# Patient Record
Sex: Male | Born: 1955
Health system: Southern US, Community
[De-identification: ages and names within clinical notes are randomized; demographics above are authoritative.]

## PROBLEM LIST (undated history)

## (undated) DIAGNOSIS — I5020 Unspecified systolic (congestive) heart failure: Secondary | ICD-10-CM

## (undated) DIAGNOSIS — E118 Type 2 diabetes mellitus with unspecified complications: Secondary | ICD-10-CM

## (undated) DIAGNOSIS — I509 Heart failure, unspecified: Secondary | ICD-10-CM

## (undated) DIAGNOSIS — I2510 Atherosclerotic heart disease of native coronary artery without angina pectoris: Secondary | ICD-10-CM

## (undated) DIAGNOSIS — N529 Male erectile dysfunction, unspecified: Secondary | ICD-10-CM

## (undated) DIAGNOSIS — G8929 Other chronic pain: Secondary | ICD-10-CM

## (undated) DIAGNOSIS — I10 Essential (primary) hypertension: Secondary | ICD-10-CM

## (undated) DIAGNOSIS — M25551 Pain in right hip: Secondary | ICD-10-CM

## (undated) HISTORY — PX: HAND RECONSTRUCTION: SHX1730

## (undated) HISTORY — DX: Other chronic pain: G89.29

## (undated) HISTORY — DX: Male erectile dysfunction, unspecified: N52.9

## (undated) HISTORY — DX: Pain in right hip: M25.551

---

## 2004-10-12 ENCOUNTER — Emergency Department: Payer: Self-pay | Admitting: Emergency Medicine

## 2006-03-17 ENCOUNTER — Ambulatory Visit: Payer: Self-pay | Admitting: Urology

## 2006-03-31 ENCOUNTER — Ambulatory Visit: Payer: Self-pay | Admitting: Urology

## 2006-04-02 ENCOUNTER — Ambulatory Visit: Payer: Self-pay | Admitting: Urology

## 2007-12-20 ENCOUNTER — Ambulatory Visit: Payer: Self-pay | Admitting: Urology

## 2007-12-30 ENCOUNTER — Ambulatory Visit: Payer: Self-pay | Admitting: Urology

## 2008-01-10 ENCOUNTER — Ambulatory Visit (HOSPITAL_COMMUNITY): Admission: RE | Admit: 2008-01-10 | Discharge: 2008-01-10 | Payer: Self-pay | Admitting: Orthopedic Surgery

## 2008-02-28 ENCOUNTER — Encounter: Admission: RE | Admit: 2008-02-28 | Discharge: 2008-02-28 | Payer: Self-pay | Admitting: Orthopedic Surgery

## 2008-03-28 ENCOUNTER — Encounter: Admission: RE | Admit: 2008-03-28 | Discharge: 2008-03-28 | Payer: Self-pay | Admitting: Orthopedic Surgery

## 2008-04-27 ENCOUNTER — Ambulatory Visit (HOSPITAL_COMMUNITY): Admission: RE | Admit: 2008-04-27 | Discharge: 2008-04-28 | Payer: Self-pay | Admitting: Orthopedic Surgery

## 2008-05-23 ENCOUNTER — Inpatient Hospital Stay (HOSPITAL_COMMUNITY): Admission: RE | Admit: 2008-05-23 | Discharge: 2008-05-26 | Payer: Self-pay | Admitting: Orthopedic Surgery

## 2009-01-02 ENCOUNTER — Ambulatory Visit (HOSPITAL_BASED_OUTPATIENT_CLINIC_OR_DEPARTMENT_OTHER): Admission: RE | Admit: 2009-01-02 | Discharge: 2009-01-02 | Payer: Self-pay | Admitting: Orthopedic Surgery

## 2010-03-05 LAB — HM COLONOSCOPY

## 2010-04-30 LAB — BASIC METABOLIC PANEL
BUN: 11 mg/dL (ref 6–23)
BUN: 9 mg/dL (ref 6–23)
CO2: 27 mEq/L (ref 19–32)
CO2: 30 mEq/L (ref 19–32)
Calcium: 8.7 mg/dL (ref 8.4–10.5)
Chloride: 101 mEq/L (ref 96–112)
Chloride: 102 mEq/L (ref 96–112)
Creatinine, Ser: 0.81 mg/dL (ref 0.4–1.5)
GFR calc Af Amer: >60 mL/min (ref >60)
GFR calc non Af Amer: >60 mL/min (ref >60)
Glucose, Bld: 112 mg/dL — ABNORMAL HIGH (ref 70–99)
Glucose, Bld: 184 mg/dL — ABNORMAL HIGH (ref 70–99)
Potassium: 3.6 mEq/L (ref 3.5–5.1)
Potassium: 3.9 mEq/L (ref 3.5–5.1)
Sodium: 133 mEq/L — ABNORMAL LOW (ref 135–145)
Sodium: 140 mEq/L (ref 135–145)
Sodium: 140 mEq/L (ref 135–145)

## 2010-04-30 LAB — CBC
Hemoglobin: 11 g/dL — ABNORMAL LOW (ref 13.0–17.0)
MCHC: 35.1 g/dL (ref 30.0–36.0)
MCV: 92.1 fL (ref 78.0–100.0)
MCV: 92.2 fL (ref 78.0–100.0)
RBC: 3.39 MIL/uL — ABNORMAL LOW (ref 4.22–5.81)
RBC: 3.53 MIL/uL — ABNORMAL LOW (ref 4.22–5.81)
RBC: 3.53 MIL/uL — ABNORMAL LOW (ref 4.22–5.81)
WBC: 13.1 10*3/uL — ABNORMAL HIGH (ref 4.0–10.5)
WBC: 9.2 10*3/uL (ref 4.0–10.5)

## 2010-04-30 LAB — GRAM STAIN: Gram Stain: NONE SEEN

## 2010-04-30 LAB — TISSUE CULTURE
Culture: NO GROWTH
Gram Stain: NONE SEEN

## 2010-04-30 LAB — ANAEROBIC CULTURE: Gram Stain: NONE SEEN

## 2010-04-30 LAB — PROTIME-INR
INR: 1.3 (ref 0.00–1.49)
INR: 2.7 — ABNORMAL HIGH (ref 0.00–1.49)
Prothrombin Time: 30.3 seconds — ABNORMAL HIGH (ref 11.6–15.2)

## 2010-05-01 LAB — CROSSMATCH
Antibody Screen: NEGATIVE
Antibody Screen: NEGATIVE

## 2010-05-01 LAB — CBC
HCT: 39.1 % (ref 39.0–52.0)
HCT: 42.8 % (ref 39.0–52.0)
Hemoglobin: 15.3 g/dL (ref 13.0–17.0)
MCHC: 34.7 g/dL (ref 30.0–36.0)
MCV: 90.8 fL (ref 78.0–100.0)
MCV: 93.1 fL (ref 78.0–100.0)
Platelets: 212 10*3/uL (ref 150–400)
Platelets: 132 10*3/uL — ABNORMAL LOW (ref 150–400)
RBC: 4.54 MIL/uL (ref 4.22–5.81)
RDW: 13.8 % (ref 11.5–15.5)
WBC: 8.8 10*3/uL (ref 4.0–10.5)
WBC: 8.2 10*3/uL (ref 4.0–10.5)

## 2010-05-01 LAB — DIFFERENTIAL
Basophils Absolute: 0 10*3/uL (ref 0.0–0.1)
Basophils Absolute: 0 10*3/uL (ref 0.0–0.1)
Basophils Relative: 0 % (ref 0–1)
Eosinophils Absolute: 0.1 10*3/uL (ref 0.0–0.7)
Eosinophils Absolute: 0.2 10*3/uL (ref 0.0–0.7)
Eosinophils Relative: 1 % (ref 0–5)
Lymphocytes Relative: 28 % (ref 12–46)
Lymphs Abs: 2.4 10*3/uL (ref 0.7–4.0)
Monocytes Absolute: 0.6 10*3/uL (ref 0.1–1.0)
Monocytes Relative: 10 % (ref 3–12)
Neutro Abs: 4.4 10*3/uL (ref 1.7–7.7)
Neutrophils Relative %: 54 % (ref 43–77)

## 2010-05-01 LAB — URINALYSIS, ROUTINE W REFLEX MICROSCOPIC
Bilirubin Urine: NEGATIVE
Hgb urine dipstick: NEGATIVE
Hgb urine dipstick: NEGATIVE
Ketones, ur: NEGATIVE mg/dL
Nitrite: NEGATIVE
Protein, ur: NEGATIVE mg/dL
Specific Gravity, Urine: 1.031 — ABNORMAL HIGH (ref 1.005–1.030)
Specific Gravity, Urine: 1.027 (ref 1.005–1.030)
Urobilinogen, UA: 0.2 mg/dL (ref 0.0–1.0)
Urobilinogen, UA: 0.2 mg/dL (ref 0.0–1.0)

## 2010-05-01 LAB — TISSUE CULTURE: Culture: NO GROWTH

## 2010-05-01 LAB — COMPREHENSIVE METABOLIC PANEL
ALT: 22 U/L (ref 0–53)
AST: 25 U/L (ref 0–37)
Albumin: 4.3 g/dL (ref 3.5–5.2)
Alkaline Phosphatase: 59 U/L (ref 39–117)
BUN: 14 mg/dL (ref 6–23)
CO2: 27 mEq/L (ref 19–32)
CO2: 29 mEq/L (ref 19–32)
Chloride: 101 mEq/L (ref 96–112)
Chloride: 104 mEq/L (ref 96–112)
Creatinine, Ser: 0.71 mg/dL (ref 0.4–1.5)
GFR calc Af Amer: >60 mL/min (ref >60)
GFR calc non Af Amer: >60 mL/min (ref >60)
GFR calc non Af Amer: >60 mL/min (ref >60)
Glucose, Bld: 82 mg/dL (ref 70–99)
Potassium: 4.1 mEq/L (ref 3.5–5.1)
Sodium: 139 mEq/L (ref 135–145)
Total Bilirubin: 0.4 mg/dL (ref 0.3–1.2)
Total Bilirubin: 0.6 mg/dL (ref 0.3–1.2)

## 2010-05-01 LAB — GRAM STAIN
Gram Stain: NONE SEEN
Gram Stain: NONE SEEN

## 2010-05-01 LAB — PROTIME-INR
Prothrombin Time: 13.3 seconds (ref 11.6–15.2)
Prothrombin Time: 13.4 seconds (ref 11.6–15.2)

## 2010-05-01 LAB — ABO/RH: ABO/RH(D): A POS

## 2010-05-01 LAB — C-REACTIVE PROTEIN: CRP: 0.2 mg/dL — ABNORMAL LOW (ref <0.6)

## 2010-05-01 LAB — SEDIMENTATION RATE: Sed Rate: 14 mm/hr (ref 0–16)

## 2010-05-01 LAB — ANAEROBIC CULTURE
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
## 2010-07-23 ENCOUNTER — Ambulatory Visit: Payer: Self-pay | Admitting: Pain Medicine

## 2011-04-30 ENCOUNTER — Ambulatory Visit: Payer: Self-pay | Admitting: Family Medicine

## 2011-05-07 ENCOUNTER — Ambulatory Visit: Payer: Self-pay | Admitting: General Surgery

## 2011-05-07 LAB — CBC
RBC: 4.86 10*6/uL (ref 4.40–5.90)
RDW: 12.9 % (ref 11.5–14.5)
WBC: 7.4 10*3/uL (ref 3.8–10.6)

## 2011-05-12 ENCOUNTER — Ambulatory Visit: Payer: Self-pay | Admitting: General Surgery

## 2011-05-12 HISTORY — PX: CHOLECYSTECTOMY: SHX55

## 2011-05-14 LAB — PATHOLOGY REPORT

## 2011-10-31 HISTORY — PX: HEMORRHOID SURGERY: SHX153

## 2014-05-14 NOTE — Op Note (Signed)
PATIENT NAME:  Curtis [name], Curtis [name] MR#:  [id number] DATE OF BIRTH:  06-01-1955  DATE OF PROCEDURE:  05/12/2011  PREOPERATIVE DIAGNOSIS: Chronic cholecystitis and cholelithiasis.   POSTOPERATIVE DIAGNOSIS: Chronic cholecystitis and cholelithiasis.  OPERATIVE PROCEDURE: Laparoscopic cholecystectomy with intraoperative cholangiograms.   SURGEON: Donnalee Curry, MD   ANESTHESIA: General endotracheal.   ESTIMATED BLOOD LOSS: Less than 5 mL.   CLINICAL NOTE: This 59 year old male has had episodic abdominal pain, and ultrasound showed evidence of cholelithiasis. He was admitted for elective cholecystectomy.   OPERATIVE NOTE: With the patient under adequate general endotracheal anesthesia, the abdomen was prepped with ChloraPrep and draped. In Trendelenburg position, a Veress needle was placed through a transumbilical incision. After assuring intra-abdominal location with the hanging drop test, the abdomen was insufflated with CO2 at 10 mmHg pressure. The patient was then placed in reverse Trendelenburg position and rolled to the left. An 11 mm Xcel port was placed in the epigastrium under direct vision and two 5-mm step ports placed on the right lateral abdominal wall. Adhesions of the omentum to the liver and undersurface of the gallbladder were taken down with cautery dissection. The gallbladder wall was a pale, pasty cream-color. The gallbladder was placed on cephalad traction. It was not particularly distended. The neck of the gallbladder was cleared and the cystic duct identified. A Kumar clamp was placed and fluoroscopic cholangiograms completed using 25 mL of one-half strength Conray 60. This showed a small cystic duct as well as a fairly small common hepatic and common bile duct. There was reflux into both the right and left hepatic ducts and free flow into the duodenum. No evidence of retained stones. The cystic duct and branches of the cystic artery were doubly clipped and divided. The gallbladder  was removed from the liver bed making use of hook cautery dissection. It was placed into a retrieval bag due to the appearance of the gallbladder wall to minimize the chance of rupture. This was delivered easily through the umbilical site. After re-establishing pneumoperitoneum, the right upper quadrant was irrigated with lactated Ringer's solution. Good hemostasis was appreciated. The fascia at the umbilical site was closed with an 0 Vicryl suture. Skin incisions were closed with 4-0 Vicryl subcuticular sutures. Benzoin, Steri-Strips, Telfa, and Tegaderm dressings were then applied.   The patient tolerated the procedure well and was taken to the recovery room in stable condition.  ____________________________ Earline Mayotte, MD jwb:cbb D: 05/12/2011 20:54:24 ET T: 05/13/2011 09:36:15 ET JOB#: [id number]  cc: Earline Mayotte, MD, <Dictator> [place]. [place], [place] Ioan Landini Brion Aliment MD ELECTRONICALLY SIGNED 05/13/2011 14:59

## 2014-09-18 ENCOUNTER — Other Ambulatory Visit: Payer: Self-pay | Admitting: Family Medicine

## 2014-09-18 DIAGNOSIS — G8929 Other chronic pain: Secondary | ICD-10-CM

## 2014-09-18 DIAGNOSIS — M25551 Pain in right hip: Principal | ICD-10-CM

## 2014-09-18 MED ORDER — HYDROCODONE-ACETAMINOPHEN 5-325 MG PO TABS: ORAL_TABLET | ORAL | Status: DC

## 2014-09-18 NOTE — Telephone Encounter (Signed)
Refill Request.  

## 2014-09-18 NOTE — Telephone Encounter (Signed)
Prescriptions x 3 up front for pickup

## 2014-09-18 NOTE — Telephone Encounter (Signed)
Pt contacted office for refill request on the following medications:  Hydrocodone 5-325mg . 90 day supply. CB#[id number]

## 2014-09-19 NOTE — Telephone Encounter (Signed)
Patient has been advised Rx is available for pick up. KW 

## 2015-01-08 ENCOUNTER — Other Ambulatory Visit: Payer: Self-pay | Admitting: Family Medicine

## 2015-01-08 ENCOUNTER — Telehealth: Payer: Self-pay | Admitting: Family Medicine

## 2015-01-08 DIAGNOSIS — M25551 Pain in right hip: Principal | ICD-10-CM

## 2015-01-08 DIAGNOSIS — G8929 Other chronic pain: Secondary | ICD-10-CM

## 2015-01-08 MED ORDER — HYDROCODONE-ACETAMINOPHEN 5-325 MG PO TABS: ORAL_TABLET | ORAL | Status: DC

## 2015-01-08 NOTE — Telephone Encounter (Signed)
Refill request

## 2015-01-08 NOTE — Telephone Encounter (Signed)
Prescriptions available for pickup. Will need office visit prior to next refills.

## 2015-01-08 NOTE — Telephone Encounter (Signed)
Left detailed message instructing patient as below.KW

## 2015-01-08 NOTE — Telephone Encounter (Signed)
Pt called needing HYDROcodone-acetaminophen (NORCO/VICODIN) 5-325   He said that you write 3 separate Rxs for three months.  His call back is  [phone]  [facility]

## 2015-01-12 ENCOUNTER — Telehealth: Payer: Self-pay

## 2015-01-23 ENCOUNTER — Encounter: Payer: Self-pay | Admitting: Family Medicine

## 2015-01-23 ENCOUNTER — Ambulatory Visit (INDEPENDENT_AMBULATORY_CARE_PROVIDER_SITE_OTHER): Payer: Self-pay | Admitting: Family Medicine

## 2015-01-23 VITALS — BP 124/70 | HR 80 | Temp 98.6°F | Resp 16 | Wt 153.4 lb

## 2015-01-23 DIAGNOSIS — M79601 Pain in right arm: Secondary | ICD-10-CM

## 2015-01-23 DIAGNOSIS — G8929 Other chronic pain: Secondary | ICD-10-CM

## 2015-01-23 DIAGNOSIS — M79604 Pain in right leg: Secondary | ICD-10-CM

## 2015-01-23 DIAGNOSIS — Z87442 Personal history of urinary calculi: Secondary | ICD-10-CM

## 2015-01-23 DIAGNOSIS — N529 Male erectile dysfunction, unspecified: Secondary | ICD-10-CM | POA: Insufficient documentation

## 2015-01-23 DIAGNOSIS — M79606 Pain in leg, unspecified: Secondary | ICD-10-CM

## 2015-01-23 NOTE — Patient Instructions (Signed)
Consider labs at [facility] Department to check your cholesterol, sugar, and kidney function. F/u with [facility] regarding screening colonoscopy. F/u with urology as scheduled.
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
## 2015-05-02 ENCOUNTER — Other Ambulatory Visit: Payer: Self-pay | Admitting: Family Medicine

## 2015-05-02 DIAGNOSIS — M25551 Pain in right hip: Principal | ICD-10-CM

## 2015-05-02 DIAGNOSIS — G8929 Other chronic pain: Secondary | ICD-10-CM

## 2015-05-02 MED ORDER — HYDROCODONE-ACETAMINOPHEN 5-325 MG PO TABS: ORAL_TABLET | ORAL | Status: DC

## 2015-05-02 NOTE — Telephone Encounter (Signed)
Patient advised RX is at front desk for pick up.  

## 2015-05-02 NOTE — Telephone Encounter (Signed)
Pt contacted office for refill request on the following medications: HYDROcodone-acetaminophen (NORCO/VICODIN) 5-325 MG tablet. Pt would like to pick up 3 RX for a 90 day supply. Please advise. Thanks TNP

## 2015-05-02 NOTE — Telephone Encounter (Signed)
Prescriptions completed for 3 months and up front for pickup

## 2015-05-22 ENCOUNTER — Encounter: Payer: Self-pay | Admitting: Family Medicine

## 2015-05-22 ENCOUNTER — Telehealth: Payer: Self-pay | Admitting: Family Medicine

## 2015-05-22 ENCOUNTER — Ambulatory Visit (INDEPENDENT_AMBULATORY_CARE_PROVIDER_SITE_OTHER): Payer: Self-pay | Admitting: Family Medicine

## 2015-05-22 VITALS — BP 130/84 | HR 104 | Temp 98.6°F | Resp 16

## 2015-05-22 DIAGNOSIS — H6983 Other specified disorders of Eustachian tube, bilateral: Secondary | ICD-10-CM

## 2015-05-22 DIAGNOSIS — J069 Acute upper respiratory infection, unspecified: Secondary | ICD-10-CM

## 2015-05-22 NOTE — Telephone Encounter (Signed)
Pt wife is asking if a Rx was called in for todays visit?  [facility] [place].  [phone]

## 2015-05-22 NOTE — Patient Instructions (Signed)
Discussed use of Mucinex D for congestion, Delsym for cough, and Benadryl for post nasal drainage at night. Start steroid nasal spray to help with your ear fullness. Stop Q-tips.

## 2015-05-22 NOTE — Telephone Encounter (Signed)
Advised patient's wife about today's office visit.

## 2015-05-22 NOTE — Progress Notes (Signed)
Subjective:     Patient ID: Curtis [name], male   DOB: 04-Jul-1955, 60 y.o.   MRN: [id number]  HPI  Chief Complaint  Patient presents with  . Ear Pain    bilateral pain 2-3 days temp 100 last night  States he first developed sinus congestion then ear discomfort-"like a balloon"-and feeling feverish. Denies sore throat and cough. Reports using otc products including Advil cold and sinus without improvement. States he does use Q-tips regularly. His wife, Lura Em, accompanies him today and states she has been laid off from her job as a Occupational hygienist.   Review of Systems     Objective:   Physical Exam  Constitutional: He appears well-developed and well-nourished. No distress.  Ears: T.M's intact, dull, without inflammation; +tragal tenderness, ear canals sensitive but not inflamed without drainage or debris. Throat: no tonsillar enlargement or exudate Neck: no cervical adenopathy Lungs: clear     Assessment:    1. Upper respiratory infection  2. Eustachian tube dysfunction, bilateral     Plan:    Discussed use of steroid nasal spray and other otc products for his symptoms. Stop Q-tip use. May call if sinuses/ears not improving over the next few days.

## 2015-08-13 ENCOUNTER — Other Ambulatory Visit: Payer: Self-pay | Admitting: Family Medicine

## 2015-08-13 ENCOUNTER — Telehealth: Payer: Self-pay

## 2015-08-13 DIAGNOSIS — M25551 Pain in right hip: Principal | ICD-10-CM

## 2015-08-13 DIAGNOSIS — G8929 Other chronic pain: Secondary | ICD-10-CM

## 2015-08-13 MED ORDER — HYDROCODONE-ACETAMINOPHEN 5-325 MG PO TABS: ORAL_TABLET | ORAL | 0 refills | Status: DC

## 2015-08-13 NOTE — Telephone Encounter (Signed)
Pt advised-aa 

## 2015-08-13 NOTE — Telephone Encounter (Signed)
[employer] called for refill request for Norco, please review-aa

## 2015-08-13 NOTE — Telephone Encounter (Signed)
Please call and tell him his medications are ready for pickup

## 2015-08-13 NOTE — Telephone Encounter (Signed)
I have placed prescription for 3 months up front for pickup.

## 2015-11-26 ENCOUNTER — Telehealth: Payer: Self-pay | Admitting: Family Medicine

## 2015-11-26 ENCOUNTER — Other Ambulatory Visit: Payer: Self-pay | Admitting: Family Medicine

## 2015-11-26 DIAGNOSIS — M25551 Pain in right hip: Principal | ICD-10-CM

## 2015-11-26 DIAGNOSIS — G8929 Other chronic pain: Secondary | ICD-10-CM

## 2015-11-26 MED ORDER — HYDROCODONE-ACETAMINOPHEN 5-325 MG PO TABS: ORAL_TABLET | ORAL | 0 refills | Status: DC

## 2015-11-26 NOTE — Telephone Encounter (Signed)
Patient has been advised. KW 

## 2015-11-26 NOTE — Telephone Encounter (Signed)
Pt contacted office for refill request on the following medications:  HYDROcodone-acetaminophen (NORCO/VICODIN) 5-325 MG tablet.  CB#[phone]/MW

## 2015-11-26 NOTE — Telephone Encounter (Signed)
3 month prescriptions up front for pickup.

## 2015-11-26 NOTE — Telephone Encounter (Signed)
Please review. KW 

## 2016-03-10 ENCOUNTER — Encounter: Payer: Self-pay | Admitting: Family Medicine

## 2016-03-10 ENCOUNTER — Ambulatory Visit (INDEPENDENT_AMBULATORY_CARE_PROVIDER_SITE_OTHER): Payer: Self-pay | Admitting: Family Medicine

## 2016-03-10 VITALS — BP 108/62 | HR 103 | Temp 98.7°F

## 2016-03-10 DIAGNOSIS — J0100 Acute maxillary sinusitis, unspecified: Secondary | ICD-10-CM

## 2016-03-10 DIAGNOSIS — G8929 Other chronic pain: Secondary | ICD-10-CM

## 2016-03-10 DIAGNOSIS — R350 Frequency of micturition: Secondary | ICD-10-CM

## 2016-03-10 DIAGNOSIS — M79604 Pain in right leg: Secondary | ICD-10-CM

## 2016-03-10 LAB — POCT URINALYSIS DIPSTICK
Bilirubin, UA: NEGATIVE
Glucose, UA: 2000
Ketones, UA: NEGATIVE
LEUKOCYTES UA: NEGATIVE
Nitrite, UA: NEGATIVE
PROTEIN UA: NEGATIVE
Spec Grav, UA: <=1.005
Urobilinogen, UA: 0.2
pH, UA: 5

## 2016-03-10 MED ORDER — HYDROCODONE-ACETAMINOPHEN 5-325 MG PO TABS: ORAL_TABLET | ORAL | 0 refills | Status: DC

## 2016-03-10 MED ORDER — AMOXICILLIN-POT CLAVULANATE 875-125 MG PO TABS: 1.0000 | ORAL_TABLET | Freq: Two times a day (BID) | ORAL | 0 refills | Status: DC

## 2016-03-10 NOTE — Patient Instructions (Signed)
We will call you with the culture results 

## 2016-03-10 NOTE — Progress Notes (Signed)
Subjective:     Patient ID: Curtis [name], male   DOB: [birth year], 61 y.o.   MRN: [id number]  HPI  Chief Complaint  Patient presents with  . Pain    Patient comes in office today to follopw up for chronic pain of lower extremity, patient is requesting a refill on his Hydrocodone-Acetaminophen 5-325mg .   Also reports cold sx onset one week ago with increased sinus pressure and purulent drainage. Also reports urinary frequency and fears he is developing a UTI. Patient defers screening labs/flu shot today.   Review of Systems     Objective:   Physical Exam  Constitutional: He appears well-developed and well-nourished. No distress.  Uses a motorized wheelchair.  Ears: T.M's intact without inflammation Sinuses: mild maxillary sinus tenderness Throat: no tonsillar enlargement or exudate Neck: no cervical adenopathy Lungs: clear     Assessment:    1. Urinary frequency - POCT urinalysis dipstick - Urine culture  2. Chronic pain of right lower extremity: refilled x 3 months - HYDROcodone-acetaminophen (NORCO/VICODIN) 5-325 MG tablet; Twice daily for pain  Dispense: 60 tablet; Refill: 0  3. Acute non-recurrent maxillary sinusitis - amoxicillin-clavulanate (AUGMENTIN) 875-125 MG tablet; Take 1 tablet by mouth 2 (two) times daily.  Dispense: 20 tablet; Refill: 0    Plan:    Further f/u pending urine culture.

## 2016-03-12 LAB — URINE CULTURE

## 2016-03-13 ENCOUNTER — Other Ambulatory Visit: Payer: Self-pay | Admitting: Family Medicine

## 2016-03-13 ENCOUNTER — Telehealth: Payer: Self-pay

## 2016-03-13 MED ORDER — DOXYCYCLINE HYCLATE 100 MG PO TABS
100.0000 mg | ORAL_TABLET | Freq: Two times a day (BID) | ORAL | 0 refills | Status: DC
Start: 2016-03-13 — End: 2017-05-25

## 2016-03-13 NOTE — Telephone Encounter (Signed)
Patient has been advised. KW 

## 2016-03-13 NOTE — Telephone Encounter (Signed)
-----   Message from Anola Gurney, [place] sent at 03/13/2016  7:29 AM EST ----- You do have a urinary tract infection but it is resistant to the medication I prescribed. I am going to switch you to doxycycline and will send it in.

## 2016-06-17 ENCOUNTER — Telehealth: Payer: Self-pay | Admitting: Family Medicine

## 2016-06-17 NOTE — Telephone Encounter (Signed)
Pt contacted office for refill request on the following medications: HYDROcodone-acetaminophen (NORCO/VICODIN) 5-325 MG tablet  Last Rx: 03/10/16 Pt picked up 3 Rx's  Pt is requesting to pick up 3 Rx's to last him for 3 months. Please advise. Thanks TNP

## 2016-06-18 ENCOUNTER — Other Ambulatory Visit: Payer: Self-pay | Admitting: Family Medicine

## 2016-06-18 DIAGNOSIS — G8929 Other chronic pain: Secondary | ICD-10-CM

## 2016-06-18 DIAGNOSIS — M79604 Pain in right leg: Principal | ICD-10-CM

## 2016-06-18 MED ORDER — HYDROCODONE-ACETAMINOPHEN 5-325 MG PO TABS: ORAL_TABLET | ORAL | 0 refills | Status: DC

## 2016-06-18 NOTE — Telephone Encounter (Signed)
Hydrocodone prescriptions up front for pickup.

## 2016-06-18 NOTE — Telephone Encounter (Signed)
Please review. Thanks!  

## 2016-06-18 NOTE — Telephone Encounter (Signed)
Tried calling patient --no answer

## 2016-09-29 ENCOUNTER — Other Ambulatory Visit: Payer: Self-pay | Admitting: Family Medicine

## 2016-09-29 DIAGNOSIS — M79604 Pain in right leg: Principal | ICD-10-CM

## 2016-09-29 DIAGNOSIS — G8929 Other chronic pain: Secondary | ICD-10-CM

## 2016-09-29 MED ORDER — HYDROCODONE-ACETAMINOPHEN 5-325 MG PO TABS: ORAL_TABLET | ORAL | 0 refills | Status: DC

## 2016-09-29 NOTE — Telephone Encounter (Signed)
Prescriptions up front for pickup

## 2016-09-29 NOTE — Telephone Encounter (Signed)
Pt needs refill on his hydrocodone 5-325 mg   He wants 3 prescriptions.  Thanks [employer]

## 2017-02-09 ENCOUNTER — Other Ambulatory Visit: Payer: Self-pay | Admitting: Family Medicine

## 2017-02-09 DIAGNOSIS — M79604 Pain in right leg: Principal | ICD-10-CM

## 2017-02-09 DIAGNOSIS — G8929 Other chronic pain: Secondary | ICD-10-CM

## 2017-02-09 MED ORDER — HYDROCODONE-ACETAMINOPHEN 5-325 MG PO TABS: ORAL_TABLET | ORAL | 0 refills | Status: DC

## 2017-02-09 NOTE — Telephone Encounter (Signed)
Pt. Needs refill on Hydrocodone 5/325 mg.

## 2017-02-09 NOTE — Telephone Encounter (Signed)
3 month's prescriptions up front for pickup

## 2017-02-09 NOTE — Telephone Encounter (Signed)
Patient advised.KW 

## 2017-04-03 ENCOUNTER — Telehealth: Payer: Self-pay | Admitting: Family Medicine

## 2017-04-03 ENCOUNTER — Ambulatory Visit: Payer: Self-pay | Admitting: Family Medicine

## 2017-04-03 NOTE — Telephone Encounter (Signed)
Pt states he needs an RX for a new wheelchair by today at noon.  I scheduled him an appt for today @ 940 and told him I would send a message as well.  Pt requesting a call back if ne doesn't need an appt for this request.

## 2017-04-03 NOTE — Telephone Encounter (Signed)
Nadine Counts had spoke with patient on phone and was able to resolve the matter. Patients appt has been canceled. KW

## 2017-04-08 ENCOUNTER — Telehealth: Payer: Self-pay | Admitting: Family Medicine

## 2017-04-08 NOTE — Telephone Encounter (Signed)
[employer] received a copy of a RX for a wheel chair for Curtis [name].  However, the copy is not legible and they need a new one faxed back to them.

## 2017-04-08 NOTE — Telephone Encounter (Signed)
Fax # to [employer] is [phone]

## 2017-04-09 NOTE — Telephone Encounter (Signed)
done

## 2017-05-25 ENCOUNTER — Ambulatory Visit (INDEPENDENT_AMBULATORY_CARE_PROVIDER_SITE_OTHER): Payer: Self-pay | Admitting: Family Medicine

## 2017-05-25 ENCOUNTER — Other Ambulatory Visit: Payer: Self-pay | Admitting: Family Medicine

## 2017-05-25 ENCOUNTER — Encounter: Payer: Self-pay | Admitting: Family Medicine

## 2017-05-25 VITALS — BP 120/70 | HR 104 | Temp 98.9°F | Resp 16

## 2017-05-25 DIAGNOSIS — G8929 Other chronic pain: Secondary | ICD-10-CM

## 2017-05-25 DIAGNOSIS — M79604 Pain in right leg: Secondary | ICD-10-CM

## 2017-05-25 MED ORDER — HYDROCODONE-ACETAMINOPHEN 5-325 MG PO TABS: ORAL_TABLET | ORAL | 0 refills | Status: DC

## 2017-05-25 NOTE — Patient Instructions (Signed)
Call for the next refill. Let me know if you are developing any further respiratory sx.

## 2017-05-25 NOTE — Progress Notes (Signed)
  Subjective:     Patient ID: Curtis [name], male   DOB: 1955/11/30, 62 y.o.   MRN: [id number] Chief Complaint  Patient presents with  . Follow-up    Patient comes in office today for follow up of chornic pain on the right lower extremity, patient is currently taking Hydrocodone-Acetaminophen 5-325mg .    HPI States he is recovering from a URI. Continues to smoke 0.5 ppd. He is pending getting a new electric w/c and is working on disability. Defers labs.  Review of Systems  Respiratory: Negative for shortness of breath.   Cardiovascular: Negative for chest pain and palpitations.  Musculoskeletal:       Reports nocturnal muscle spasms of his right buttock area. He resolves this by getting up and letting his affected side hang until the spasms exhaust themselves.       Objective:   Physical Exam  Constitutional: He appears well-developed and well-nourished. No distress.  Pulmonary/Chest: Breath sounds normal. He has no wheezes.  Musculoskeletal: He exhibits no edema (of lower extremities).       Assessment:    1. Chronic pain of right lower extremity - HYDROcodone-acetaminophen (NORCO/VICODIN) 5-325 MG tablet; Twice daily for pain  Dispense: 60 tablet; Refill: 0    Plan:    May call for monthly refills of pain medication.

## 2017-07-10 ENCOUNTER — Other Ambulatory Visit: Payer: Self-pay

## 2017-07-10 DIAGNOSIS — G8929 Other chronic pain: Secondary | ICD-10-CM

## 2017-07-10 DIAGNOSIS — M79604 Pain in right leg: Principal | ICD-10-CM

## 2017-07-10 MED ORDER — HYDROCODONE-ACETAMINOPHEN 5-325 MG PO TABS: ORAL_TABLET | ORAL | 0 refills | Status: DC

## 2017-08-24 ENCOUNTER — Telehealth: Payer: Self-pay

## 2017-08-24 ENCOUNTER — Other Ambulatory Visit: Payer: Self-pay | Admitting: Family Medicine

## 2017-08-24 DIAGNOSIS — M79604 Pain in right leg: Principal | ICD-10-CM

## 2017-08-24 DIAGNOSIS — G8929 Other chronic pain: Secondary | ICD-10-CM

## 2017-08-24 MED ORDER — HYDROCODONE-ACETAMINOPHEN 5-325 MG PO TABS: ORAL_TABLET | ORAL | 0 refills | Status: DC

## 2017-08-24 NOTE — Telephone Encounter (Signed)
Patient called requesting refill on hydrocodone. [facility] [place].

## 2017-08-24 NOTE — Telephone Encounter (Signed)
done

## 2017-10-07 ENCOUNTER — Other Ambulatory Visit: Payer: Self-pay | Admitting: Family Medicine

## 2017-10-07 DIAGNOSIS — M79604 Pain in right leg: Principal | ICD-10-CM

## 2017-10-07 DIAGNOSIS — G8929 Other chronic pain: Secondary | ICD-10-CM

## 2017-10-07 MED ORDER — HYDROCODONE-ACETAMINOPHEN 5-325 MG PO TABS: ORAL_TABLET | ORAL | 0 refills | Status: DC

## 2017-10-07 NOTE — Telephone Encounter (Signed)
Pt contacted office for refill request on the following medications:  HYDROcodone-acetaminophen (NORCO/VICODIN) 5-325 MG tablet  [facility] [place]  Last Rx: 08/24/17 LOV: 05/25/17 Please advise. Thanks TNP

## 2017-11-16 ENCOUNTER — Telehealth: Payer: Self-pay | Admitting: Family Medicine

## 2017-11-16 NOTE — Telephone Encounter (Signed)
Pt needs refill on   Hydrocodone 5-325    [facility] [place]  CB#  [phone]  Thanks   teri

## 2017-11-17 ENCOUNTER — Other Ambulatory Visit: Payer: Self-pay | Admitting: Family Medicine

## 2017-11-17 DIAGNOSIS — G8929 Other chronic pain: Secondary | ICD-10-CM

## 2017-11-17 DIAGNOSIS — M79604 Pain in right leg: Principal | ICD-10-CM

## 2017-11-17 MED ORDER — HYDROCODONE-ACETAMINOPHEN 5-325 MG PO TABS: ORAL_TABLET | ORAL | 0 refills | Status: DC

## 2018-01-21 ENCOUNTER — Telehealth: Payer: Self-pay | Admitting: Family Medicine

## 2018-01-21 ENCOUNTER — Other Ambulatory Visit: Payer: Self-pay | Admitting: Family Medicine

## 2018-01-21 DIAGNOSIS — M79604 Pain in right leg: Principal | ICD-10-CM

## 2018-01-21 DIAGNOSIS — G8929 Other chronic pain: Secondary | ICD-10-CM

## 2018-01-21 MED ORDER — HYDROCODONE-ACETAMINOPHEN 5-325 MG PO TABS: ORAL_TABLET | ORAL | 0 refills | Status: DC

## 2018-01-21 NOTE — Telephone Encounter (Signed)
Pt requesting refill of HYDROcodone-acetaminophen 5-325 MG sent to [facility] in [place]

## 2018-01-21 NOTE — Telephone Encounter (Signed)
Please review

## 2018-01-21 NOTE — Telephone Encounter (Signed)
done

## 2018-03-02 ENCOUNTER — Telehealth: Payer: Self-pay | Admitting: Family Medicine

## 2018-03-02 ENCOUNTER — Other Ambulatory Visit: Payer: Self-pay | Admitting: Family Medicine

## 2018-03-02 DIAGNOSIS — M79604 Pain in right leg: Principal | ICD-10-CM

## 2018-03-02 DIAGNOSIS — G8929 Other chronic pain: Secondary | ICD-10-CM

## 2018-03-02 MED ORDER — HYDROCODONE-ACETAMINOPHEN 5-325 MG PO TABS: ORAL_TABLET | ORAL | 0 refills | Status: DC

## 2018-03-02 NOTE — Telephone Encounter (Signed)
Pt needing refill on:  HYDROcodone-acetaminophen (NORCO/VICODIN) 5-325 MG tablet  Please refill at:  [id number] - [place], [place] - [address] AT [facility] OF [address] [phone] (Phone) [phone] (Fax)   Thanks, [employer]

## 2018-03-02 NOTE — Telephone Encounter (Signed)
lov 05/25/17, last filled 01/21/18, please review. KW

## 2018-03-04 ENCOUNTER — Ambulatory Visit (INDEPENDENT_AMBULATORY_CARE_PROVIDER_SITE_OTHER): Payer: Self-pay | Admitting: Family Medicine

## 2018-03-04 DIAGNOSIS — M79604 Pain in right leg: Secondary | ICD-10-CM

## 2018-03-04 DIAGNOSIS — G8929 Other chronic pain: Secondary | ICD-10-CM

## 2018-03-04 NOTE — Progress Notes (Signed)
Patient ID: Curtis [name], male   DOB: 10/26/1955, 63 y.o.   MRN: [id number] He is here today to discuss who he should follow up with after my retirement. He has previously seen Dr. Sherrie Mustache per his report. He takes hydrocodone once or twice daily for chronic hip pain after reconstructive surgery after severe workman's comp. Injury. I have just refilled his medication 2/11. I will have him establish with Dr. Sherrie Mustache before he requires refills.

## 2018-03-04 NOTE — Patient Instructions (Signed)
Do get established with Osvaldo Angst P.A. or Shirlee Latch M.D. before you need your next refill of pain medication.

## 2018-03-19 ENCOUNTER — Ambulatory Visit (INDEPENDENT_AMBULATORY_CARE_PROVIDER_SITE_OTHER): Payer: Self-pay | Admitting: Family Medicine

## 2018-03-19 ENCOUNTER — Encounter: Payer: Self-pay | Admitting: Family Medicine

## 2018-03-19 ENCOUNTER — Other Ambulatory Visit: Payer: Self-pay

## 2018-03-19 VITALS — BP 120/68 | HR 94 | Temp 98.2°F | Ht 68.0 in | Wt 142.0 lb

## 2018-03-19 DIAGNOSIS — E1165 Type 2 diabetes mellitus with hyperglycemia: Secondary | ICD-10-CM

## 2018-03-19 LAB — HEMOGLOBIN A1C: Hemoglobin A1C: >14

## 2018-03-19 MED ORDER — GLIPIZIDE 5 MG PO TABS: ORAL_TABLET | ORAL | 1 refills | Status: DC

## 2018-03-19 NOTE — Patient Instructions (Signed)
.   Please review the attached list of medications and notify my office if there are any errors.   . Please bring all of your medications to every appointment so we can make sure that our medication list is the same as yours.   

## 2018-03-19 NOTE — Progress Notes (Signed)
       Patient: Curtis [name] Male    DOB: 04-14-1955   63 y.o.   MRN: [id number] Visit Date: 03/19/2018  Today's Provider: Mila Merry, MD   Chief Complaint  Patient presents with  . Pain    right lower extremity.  this is a fup to establish with [facility]   Subjective:     HPI   Follow up for chronic pain of right lower extremity  The patient was last seen for this 2 weeks ago. Changes made at last visit include none  He reports excellent compliance with treatment. He feels that condition is Worse. He is not having side effects.  He reports he been taking current pain medication regiment for several years and it remains effective.   ------------------------------------------------------------------------------------  He also states he has been checking his blood sugar with his sisters meter and it has been running consistently in 200s to 300s. Has been a little more fatigued. Lately. Is urinating more frequently.     Wt Readings from Last 5 Encounters:  03/19/18 142 lb (64.4 kg)  01/23/15 153 lb 6.4 oz (69.6 kg)  06/02/14 155 lb (70.3 kg)     Allergies  Allergen Reactions  . Codeine   . Morphine And Related      Current Outpatient Medications:  .  HYDROcodone-acetaminophen (NORCO/VICODIN) 5-325 MG tablet, Twice daily for pain, Disp: 60 tablet, Rfl: 0 .  LEVITRA 20 MG tablet, TK 1/2- 1 T PO PRN, Disp: , Rfl: 11  Review of Systems  Social History   Tobacco Use  . Smoking status: Current Every Day Smoker    Packs/day: 0.50    Types: Cigarettes  . Smokeless tobacco: Never Used  Substance Use Topics  . Alcohol use: No    Alcohol/week: 0.0 standard drinks      Objective:   BP 120/68 (BP Location: Left Arm, Patient Position: Sitting, Cuff Size: Normal)   Pulse 94   Temp 98.2 F (36.8 C) (Oral)   Ht 5\' 8"  (1.727 m)   Wt 142 lb (64.4 kg)   SpO2 98%   BMI 21.59 kg/m  Vitals:   03/19/18 1122  BP: 120/68  Pulse: 94  Temp: 98.2 F (36.8 C)    TempSrc: Oral  SpO2: 98%  Weight: 142 lb (64.4 kg)  Height: 5\' 8"  (1.727 m)     Physical Exam  General appearance: alert, well developed, well nourished, cooperative and in no distress Head: Normocephalic, without obvious abnormality, atraumatic Respiratory: Respirations even and unlabored, normal respiratory rate Extremities: No gross deformities Skin: Skin color, texture, turgor normal. No rashes seen  Psych: Appropriate mood and affect. Neurologic: Mental status: Alert, oriented to person, place, and time, thought content appropriate.  HgbA1c>14.0    Assessment & Plan    1. Uncontrolled type 2 diabetes mellitus with hyperglycemia (HCC) Newly diagnosed. He currently has no insurance but is in process of appyling for disability. Will start- glipiZIDE (GLUCOTROL) 5 MG tablet; 1/2 tablet every morning for six days then increase to 1 tablet every morning  Dispense: 30 tablet; Refill: 1  But advised he will need labs soon and expect to start metformin if his kidney functions are normal.   He is to return in a month to see how sugars are on glipizide and anticipate checking renal panel.      Mila Merry, MD  [facility]

## 2018-04-05 ENCOUNTER — Other Ambulatory Visit: Payer: Self-pay

## 2018-04-05 DIAGNOSIS — G8929 Other chronic pain: Secondary | ICD-10-CM

## 2018-04-05 DIAGNOSIS — M79604 Pain in right leg: Principal | ICD-10-CM

## 2018-04-05 MED ORDER — HYDROCODONE-ACETAMINOPHEN 5-325 MG PO TABS: ORAL_TABLET | ORAL | 0 refills | Status: DC

## 2018-04-26 ENCOUNTER — Ambulatory Visit (INDEPENDENT_AMBULATORY_CARE_PROVIDER_SITE_OTHER): Payer: Self-pay | Admitting: Family Medicine

## 2018-04-26 ENCOUNTER — Other Ambulatory Visit: Payer: Self-pay

## 2018-04-26 VITALS — BP 131/76 | HR 95 | Temp 98.9°F | Resp 16

## 2018-04-26 DIAGNOSIS — E1165 Type 2 diabetes mellitus with hyperglycemia: Secondary | ICD-10-CM

## 2018-04-26 LAB — POCT GLYCOSYLATED HEMOGLOBIN (HGB A1C)
Est. average glucose Bld gHb Est-mCnc: 237
Hemoglobin A1C: 9.9 % — AB (ref 4.0–5.6)

## 2018-04-26 MED ORDER — MONTELUKAST SODIUM 10 MG PO TABS: 10.0000 mg | ORAL_TABLET | Freq: Every day | ORAL | 5 refills | Status: DC

## 2018-04-26 MED ORDER — GLIPIZIDE 5 MG PO TABS: 5.0000 mg | ORAL_TABLET | Freq: Every day | ORAL | 3 refills | Status: DC

## 2018-04-26 MED ORDER — LANCET DEVICES MISC: 5 refills | Status: AC

## 2018-04-26 NOTE — Patient Instructions (Signed)
.   Please review the attached list of medications and notify my office if there are any errors.   . Please bring all of your medications to every appointment so we can make sure that our medication list is the same as yours.   

## 2018-04-26 NOTE — Progress Notes (Signed)
Patient: Curtis [name] Male    DOB: Jul 08, 1955   63 y.o.   MRN: [id number] Visit Date: 04/26/2018  Today's Provider: Mila Merry, MD   Chief Complaint  Patient presents with  . Diabetes   Subjective:     HPI  Diabetes Mellitus Type II, Follow-up:   Lab Results  Component Value Date   HGBA1C >14.0 03/19/2018    Last seen for diabetes 2 months ago.  Management since then includes starting Glipizide. He reports good compliance with treatment. He is not having side effects.  Current symptoms include none and have been stable. Home blood sugar records: fasting range: 156 and lower.  He states that he is been strictly avoiding sugar in his diet.  Episodes of hypoglycemia? no   Current Insulin Regimen: none Most Recent Eye Exam: 1 year ago Weight trend: stable Prior visit with dietician: No Current exercise: none Current diet habits: well balanced  Pertinent Labs:    Component Value Date/Time   CREATININE 0.75 05/26/2008 0417    Wt Readings from Last 3 Encounters:  03/19/18 142 lb (64.4 kg)  01/23/15 153 lb 6.4 oz (69.6 kg)  06/02/14 155 lb (70.3 kg)    ------------------------------------------------------------------------   He also reports he is having a lot of sneezing itchy eyes runny nose and throat irritation. No fever, chills, or sweats.  Allergies  Allergen Reactions  . Codeine   . Morphine And Related      Current Outpatient Medications:  .  glipiZIDE (GLUCOTROL) 5 MG tablet, 1/2 tablet every morning for six days then increase to 1 tablet every morning, Disp: 30 tablet, Rfl: 1 .  HYDROcodone-acetaminophen (NORCO/VICODIN) 5-325 MG tablet, Twice daily for pain, Disp: 60 tablet, Rfl: 0 .  LEVITRA 20 MG tablet, TK 1/2- 1 T PO PRN, Disp: , Rfl: 11  Review of Systems  Constitutional: Negative for appetite change, chills and fever.  Respiratory: Negative for chest tightness, shortness of breath and wheezing.   Cardiovascular: Negative for  chest pain and palpitations.  Gastrointestinal: Negative for abdominal pain, nausea and vomiting.    Social History   Tobacco Use  . Smoking status: Current Every Day Smoker    Packs/day: 0.75    Types: Cigarettes  . Smokeless tobacco: Never Used  . Tobacco comment: 2-3 ppd since 63yo. cut back to under a ppd since around 2010  Substance Use Topics  . Alcohol use: No    Alcohol/week: 0.0 standard drinks      Objective:   BP 131/76   Pulse 95   Temp 98.9 F (37.2 C)   Resp 16    Physical Exam   General appearance: alert, well developed, well nourished, cooperative and in no distress Head: Normocephalic, without obvious abnormality, atraumatic Respiratory: Respirations even and unlabored, normal respiratory rate Extremities: No gross deformities Skin: Skin color, texture, turgor normal. No rashes seen  Psych: Appropriate mood and affect. Neurologic: Mental status: Alert, oriented to person, place, and time, thought content appropriate.   Results for orders placed or performed in visit on 04/26/18  POCT HgB A1C  Result Value Ref Range   Hemoglobin A1C 9.9 (A) 4.0 - 5.6 %   HbA1c POC (<> result, manual entry)     HbA1c, POC (prediabetic range)     HbA1c, POC (controlled diabetic range)     Est. average glucose Bld gHb Est-mCnc 237        Assessment & Plan    1. Uncontrolled type 2  diabetes mellitus with hyperglycemia (HCC) Doing well with dietary changes and addition of glipizide. Check labs. If random glucose >200 consider adding metformin.   - POCT HgB A1C - Lipid panel - Renal function panel - glipiZIDE (GLUCOTROL) 5 MG tablet; Take 1 tablet (5 mg total) by mouth daily before breakfast.  Dispense: 30 tablet; Refill: 3  Return in about 4 months (around 08/26/2018).     Mila Merry, MD  [facility]

## 2018-04-27 LAB — LIPID PANEL
Chol/HDL Ratio: 8.6 ratio — ABNORMAL HIGH (ref 0.0–5.0)
Cholesterol, Total: 163 mg/dL (ref 100–199)
HDL: 19 mg/dL — ABNORMAL LOW (ref >39)
Triglycerides: 423 mg/dL — ABNORMAL HIGH (ref 0–149)

## 2018-04-27 LAB — RENAL FUNCTION PANEL
Albumin: 4.2 g/dL (ref 3.8–4.8)
BUN/Creatinine Ratio: 17 (ref 10–24)
BUN: 13 mg/dL (ref 8–27)
CO2: 28 mmol/L (ref 20–29)
Calcium: 9.8 mg/dL (ref 8.6–10.2)
Chloride: 97 mmol/L (ref 96–106)
Creatinine, Ser: 0.76 mg/dL (ref 0.76–1.27)
GFR calc Af Amer: 112 mL/min/{1.73_m2} (ref >59)
GFR calc non Af Amer: 97 mL/min/{1.73_m2} (ref >59)
Glucose: 97 mg/dL (ref 65–99)
Phosphorus: 3.6 mg/dL (ref 2.8–4.1)
Potassium: 4 mmol/L (ref 3.5–5.2)
Sodium: 140 mmol/L (ref 134–144)

## 2018-04-29 ENCOUNTER — Telehealth: Payer: Self-pay

## 2018-04-29 DIAGNOSIS — E782 Mixed hyperlipidemia: Secondary | ICD-10-CM

## 2018-04-29 MED ORDER — ATORVASTATIN CALCIUM 20 MG PO TABS: 20.0000 mg | ORAL_TABLET | ORAL | 4 refills | Status: DC

## 2018-04-29 NOTE — Telephone Encounter (Signed)
Patient notified of results. Medication ordered.

## 2018-04-29 NOTE — Telephone Encounter (Signed)
-----   Message from Malva Limes, MD sent at 04/29/2018  1:13 PM EDT ----- Triglycerides are very high at 423, needs to be under 150. He needs to start atorvastatin 20mg  once every day, #30, rf x 4. Continue all other diabetes medications. Follow up in august as scheduled.

## 2018-05-17 ENCOUNTER — Other Ambulatory Visit: Payer: Self-pay | Admitting: Family Medicine

## 2018-05-17 DIAGNOSIS — E1165 Type 2 diabetes mellitus with hyperglycemia: Secondary | ICD-10-CM

## 2018-05-18 ENCOUNTER — Other Ambulatory Visit: Payer: Self-pay

## 2018-05-18 DIAGNOSIS — M79604 Pain in right leg: Principal | ICD-10-CM

## 2018-05-18 DIAGNOSIS — G8929 Other chronic pain: Secondary | ICD-10-CM

## 2018-05-18 MED ORDER — HYDROCODONE-ACETAMINOPHEN 5-325 MG PO TABS: ORAL_TABLET | ORAL | 0 refills | Status: DC

## 2018-05-18 NOTE — Telephone Encounter (Signed)
Patient is requesting refill on Hydrocodone. 

## 2018-06-17 ENCOUNTER — Telehealth: Payer: Self-pay

## 2018-06-17 DIAGNOSIS — M79604 Pain in right leg: Secondary | ICD-10-CM

## 2018-06-17 DIAGNOSIS — G8929 Other chronic pain: Secondary | ICD-10-CM

## 2018-06-17 MED ORDER — HYDROCODONE-ACETAMINOPHEN 5-325 MG PO TABS: ORAL_TABLET | ORAL | 0 refills | Status: DC

## 2018-06-17 NOTE — Telephone Encounter (Signed)
Patient calling for refills on the following medication: HYDROcodone-acetaminophen (NORCO/VICODIN) 5-325 MG tablet    [id number] - [place],  - [address] AT [facility] OF [address] [facility]

## 2018-08-05 ENCOUNTER — Other Ambulatory Visit: Payer: Self-pay | Admitting: Family Medicine

## 2018-08-05 DIAGNOSIS — M79604 Pain in right leg: Secondary | ICD-10-CM

## 2018-08-05 DIAGNOSIS — G8929 Other chronic pain: Secondary | ICD-10-CM

## 2018-08-05 MED ORDER — HYDROCODONE-ACETAMINOPHEN 5-325 MG PO TABS: ORAL_TABLET | ORAL | 0 refills | Status: DC

## 2018-08-05 NOTE — Telephone Encounter (Signed)
Pt needs refill on his pain med  Hydrocodone 5-325  [facility] [place]  teri

## 2018-08-27 ENCOUNTER — Ambulatory Visit (INDEPENDENT_AMBULATORY_CARE_PROVIDER_SITE_OTHER): Payer: Self-pay | Admitting: Family Medicine

## 2018-08-27 ENCOUNTER — Other Ambulatory Visit: Payer: Self-pay

## 2018-08-27 ENCOUNTER — Encounter: Payer: Self-pay | Admitting: Family Medicine

## 2018-08-27 VITALS — BP 131/77 | HR 82 | Temp 98.3°F | Wt 159.0 lb

## 2018-08-27 DIAGNOSIS — Z1159 Encounter for screening for other viral diseases: Secondary | ICD-10-CM

## 2018-08-27 DIAGNOSIS — E119 Type 2 diabetes mellitus without complications: Secondary | ICD-10-CM

## 2018-08-27 DIAGNOSIS — R809 Proteinuria, unspecified: Secondary | ICD-10-CM | POA: Insufficient documentation

## 2018-08-27 DIAGNOSIS — E785 Hyperlipidemia, unspecified: Secondary | ICD-10-CM | POA: Insufficient documentation

## 2018-08-27 DIAGNOSIS — R609 Edema, unspecified: Secondary | ICD-10-CM

## 2018-08-27 DIAGNOSIS — E11319 Type 2 diabetes mellitus with unspecified diabetic retinopathy without macular edema: Secondary | ICD-10-CM | POA: Insufficient documentation

## 2018-08-27 LAB — POCT UA - MICROALBUMIN: Microalbumin Ur, POC: 100 mg/L

## 2018-08-27 LAB — POCT GLYCOSYLATED HEMOGLOBIN (HGB A1C): Hemoglobin A1C: 6.8 % — AB (ref 4.0–5.6)

## 2018-08-27 NOTE — Progress Notes (Signed)
Patient: Curtis [name] Male    DOB: 10-31-1955   63 y.o.   MRN: [id number] Visit Date: 08/27/2018  Today's Provider: Mila Merry, MD   Chief Complaint  Patient presents with  . Diabetes  . Hyperlipidemia   Subjective:     HPI  Diabetes Mellitus Type II, Follow-up:   Lab Results  Component Value Date   HGBA1C 9.9 (A) 04/26/2018   HGBA1C >14.0 03/19/2018    Last seen for diabetes 4 months ago.  Management since then includes work on lifestyle changes. He reports excellent compliance with treatment. He is not having side effects.  Current symptoms include none and have been unchanged. Home blood sugar records: 140-160  Episodes of hypoglycemia? no   Current insulin regiment: Is not on insulin Most Recent Eye Exam: 1 year ago Weight trend: stable Prior visit with dietician: No Current exercise: none Current diet habits: well balanced  Pertinent Labs:    Component Value Date/Time   CHOL 163 04/26/2018 1506   TRIG 423 (H) 04/26/2018 1506   HDL 19 (L) 04/26/2018 1506   LDLCALC Comment 04/26/2018 1506   CREATININE 0.76 04/26/2018 1506    Wt Readings from Last 3 Encounters:  08/27/18 159 lb (72.1 kg)  03/19/18 142 lb (64.4 kg)  01/23/15 153 lb 6.4 oz (69.6 kg)    ------------------------------------------------------------------------  Lipid/Cholesterol, Follow-up:   Last seen for this4 months ago.  Management changes since that visit include starting Atorvastatin 20mg . Patient reports today that he was not aware that he was suppose to start Atorvastatin . Last Lipid Panel:    Component Value Date/Time   CHOL 163 04/26/2018 1506   TRIG 423 (H) 04/26/2018 1506   HDL 19 (L) 04/26/2018 1506   CHOLHDL 8.6 (H) 04/26/2018 1506   LDLCALC Comment 04/26/2018 1506    Risk factors for vascular disease include diabetes mellitus and hypercholesterolemia   Wt Readings from Last 3 Encounters:  08/27/18 159 lb (72.1 kg)  03/19/18 142 lb (64.4 kg)   01/23/15 153 lb 6.4 oz (69.6 kg)    -------------------------------------------------------------------  Allergies  Allergen Reactions  . Codeine   . Morphine And Related      Current Outpatient Medications:  .  glipiZIDE (GLUCOTROL) 5 MG tablet, TAKE 1/2 TABLET BY MOUTH EVERY MORNING FOR 6 DAYS. INCREASE TO 1 TABLET EVERY MORNING, Disp: 30 tablet, Rfl: 3 .  HYDROcodone-acetaminophen (NORCO/VICODIN) 5-325 MG tablet, Twice daily for pain, Disp: 60 tablet, Rfl: 0 .  Lancet Devices MISC, Use to check blood sugar twice a day, Disp: 100 each, Rfl: 5 .  atorvastatin (LIPITOR) 20 MG tablet, Take 1 tablet (20 mg total) by mouth every other day. Take one tablet every other day (Patient not taking: Reported on 08/27/2018), Disp: 30 tablet, Rfl: 4 .  LEVITRA 20 MG tablet, TK 1/2- 1 T PO PRN, Disp: , Rfl: 11 .  montelukast (SINGULAIR) 10 MG tablet, Take 1 tablet (10 mg total) by mouth at bedtime. (Patient not taking: Reported on 08/27/2018), Disp: 30 tablet, Rfl: 5  Review of Systems  Constitutional: Negative.   HENT: Negative.   Respiratory: Negative.   Cardiovascular: Negative.   Gastrointestinal: Negative.   Musculoskeletal: Positive for arthralgias, joint swelling and myalgias.  Skin: Negative.   Neurological: Negative.     Social History   Tobacco Use  . Smoking status: Former Smoker    Packs/day: 0.75    Types: Cigarettes    Quit date: 04/26/2018    Years  since quitting: 0.3  . Smokeless tobacco: Never Used  . Tobacco comment: 2-3 ppd since 63yo. cut back to under a ppd since around 2010  Substance Use Topics  . Alcohol use: No    Alcohol/week: 0.0 standard drinks      Objective:   BP 131/77   Pulse 82   Temp 98.3 F (36.8 C) (Oral)   Wt 159 lb (72.1 kg) Comment: patient reports  BMI 24.18 kg/m  Vitals:   08/27/18 1424  BP: 131/77  Pulse: 82  Temp: 98.3 F (36.8 C)  TempSrc: Oral  Weight: 159 lb (72.1 kg)     Physical Exam   General Appearance:    Alert,  cooperative, no distress  Eyes:    PERRL, conjunctiva/corneas clear, EOM's intact       Lungs:     Clear to auscultation bilaterally, respirations unlabored  Heart:    Normal heart rate. Normal rhythm. No murmurs, rubs, or gallops.   Neurologic:   Awake, alert, oriented x 3. No apparent focal neurological           defect.        Results for orders placed or performed in visit on 08/27/18  POCT UA - Microalbumin  Result Value Ref Range   Microalbumin Ur, POC 100 mg/L   Creatinine, POC     Albumin/Creatinine Ratio, Urine, POC    POCT glycosylated hemoglobin (Hb A1C)  Result Value Ref Range   Hemoglobin A1C 6.8 (A) 4.0 - 5.6 %   HbA1c POC (<> result, manual entry)     HbA1c, POC (prediabetic range)     HbA1c, POC (controlled diabetic range)         Assessment & Plan     1. Controlled type 2 diabetes mellitus without complication, without long-term current use of insulin (HCC) A1c greatly improved. Continue current medications.    2. Dyslipidemia He is tolerating atorvastatin well with no adverse effects.   - Comprehensive metabolic panel - Lipid panel  3. Need for hepatitis C screening test  - Hepatitis C antibody  4. Microalbuminuria Consider ACEI if repeat microalbuminuria is above 50   5. Edema, unspecified type May be secondary to weight gain or nephropathy. Check labs.  - TSH - CBC  The entirety of the information documented in the History of Present Illness, Review of Systems and Physical Exam were personally obtained by me. Portions of this information were initially documented by Minette Headland, CMA and reviewed by me for thoroughness and accuracy.      [name], MD  [facility]

## 2018-08-27 NOTE — Patient Instructions (Addendum)
.   Please review the attached list of medications and notify my office if there are any errors.   . Please bring all of your medications to every appointment so we can make sure that our medication list is the same as yours.   . We will have flu vaccines available after Labor Day. Please go to your pharmacy or call the office in early September to schedule you flu shot.  . Please go to the lab draw station in [address] on the second floor of [facility]  when you are fasting for 8 hours. Normal hours are 8:00am to 12:30pm and 1:30pm to 4:00pm Monday through Friday   

## 2018-09-06 ENCOUNTER — Other Ambulatory Visit: Payer: Self-pay | Admitting: Family Medicine

## 2018-09-06 DIAGNOSIS — G8929 Other chronic pain: Secondary | ICD-10-CM

## 2018-09-06 MED ORDER — HYDROCODONE-ACETAMINOPHEN 5-325 MG PO TABS: ORAL_TABLET | ORAL | 0 refills | Status: DC

## 2018-09-06 NOTE — Telephone Encounter (Signed)
Patient brought in proof that he got his labs drawn this morning.  He needs a refill on Hydrocodone 5-325 mg. sent to [facility]

## 2018-09-07 LAB — COMPREHENSIVE METABOLIC PANEL
ALT: 11 IU/L (ref 0–44)
AST: 12 IU/L (ref 0–40)
Albumin/Globulin Ratio: 1.4 (ref 1.2–2.2)
Albumin: 4.1 g/dL (ref 3.8–4.8)
Alkaline Phosphatase: 78 IU/L (ref 39–117)
BUN/Creatinine Ratio: 26 — ABNORMAL HIGH (ref 10–24)
BUN: 25 mg/dL (ref 8–27)
Bilirubin Total: 0.4 mg/dL (ref 0.0–1.2)
CO2: 23 mmol/L (ref 20–29)
Calcium: 9 mg/dL (ref 8.6–10.2)
Chloride: 101 mmol/L (ref 96–106)
Creatinine, Ser: 0.98 mg/dL (ref 0.76–1.27)
GFR calc Af Amer: 94 mL/min/{1.73_m2} (ref >59)
GFR calc non Af Amer: 82 mL/min/{1.73_m2} (ref >59)
Globulin, Total: 3 g/dL (ref 1.5–4.5)
Glucose: 138 mg/dL — ABNORMAL HIGH (ref 65–99)
Potassium: 3.9 mmol/L (ref 3.5–5.2)
Sodium: 143 mmol/L (ref 134–144)
Total Protein: 7.1 g/dL (ref 6.0–8.5)

## 2018-09-07 LAB — CBC
Hematocrit: 39.3 % (ref 37.5–51.0)
Hemoglobin: 13.1 g/dL (ref 13.0–17.7)
MCH: 28.5 pg (ref 26.6–33.0)
MCHC: 33.3 g/dL (ref 31.5–35.7)
MCV: 86 fL (ref 79–97)
Platelets: 178 10*3/uL (ref 150–450)
RBC: 4.59 x10E6/uL (ref 4.14–5.80)
RDW: 14.9 % (ref 11.6–15.4)
WBC: 7.5 10*3/uL (ref 3.4–10.8)

## 2018-09-07 LAB — LIPID PANEL
Chol/HDL Ratio: 5.7 ratio — ABNORMAL HIGH (ref 0.0–5.0)
Cholesterol, Total: 126 mg/dL (ref 100–199)
HDL: 22 mg/dL — ABNORMAL LOW (ref >39)
LDL Calculated: 51 mg/dL (ref 0–99)
Triglycerides: 264 mg/dL — ABNORMAL HIGH (ref 0–149)
VLDL Cholesterol Cal: 53 mg/dL — ABNORMAL HIGH (ref 5–40)

## 2018-09-07 LAB — TSH: TSH: 5.23 u[IU]/mL — ABNORMAL HIGH (ref 0.450–4.500)

## 2018-09-07 LAB — HEPATITIS C ANTIBODY: Hep C Virus Ab: <0.1 s/co ratio (ref 0.0–0.9)

## 2018-09-08 ENCOUNTER — Telehealth: Payer: Self-pay

## 2018-09-08 NOTE — Telephone Encounter (Signed)
-----   Message from Birdie Sons, MD sent at 09/07/2018  4:30 PM EDT ----- hdl is low and triglycerides are low. He needs to increase Unsaturated fats in diet, such as fish oil, oliver, and flax seed oil. Rest of labs are good. Continue current medications.  Follow up in December as scheduled.

## 2018-09-08 NOTE — Telephone Encounter (Signed)
Patient advised as below.  

## 2018-10-12 ENCOUNTER — Other Ambulatory Visit: Payer: Self-pay | Admitting: Family Medicine

## 2018-10-12 DIAGNOSIS — E1165 Type 2 diabetes mellitus with hyperglycemia: Secondary | ICD-10-CM

## 2018-11-10 ENCOUNTER — Other Ambulatory Visit: Payer: Self-pay | Admitting: Family Medicine

## 2018-11-10 DIAGNOSIS — M79604 Pain in right leg: Secondary | ICD-10-CM

## 2018-11-10 DIAGNOSIS — G8929 Other chronic pain: Secondary | ICD-10-CM

## 2018-11-10 NOTE — Telephone Encounter (Signed)
Pt needing a refill on: °HYDROcodone-acetaminophen (NORCO/VICODIN) 5-325 MG tablet ° °Please fill at: ° °[id number] - [place], [place] - [address] AT [facility] [address] [phone] (Phone) °[phone] (Fax)  ° °Thanks, °[facility] °

## 2018-11-10 NOTE — Telephone Encounter (Signed)
Last OV 08/27/2018 and last RF was 09/06/2018

## 2018-11-11 MED ORDER — HYDROCODONE-ACETAMINOPHEN 5-325 MG PO TABS: ORAL_TABLET | ORAL | 0 refills | Status: DC

## 2018-12-13 ENCOUNTER — Other Ambulatory Visit: Payer: Self-pay | Admitting: Family Medicine

## 2018-12-13 DIAGNOSIS — G8929 Other chronic pain: Secondary | ICD-10-CM

## 2018-12-13 DIAGNOSIS — M79604 Pain in right leg: Secondary | ICD-10-CM

## 2018-12-13 MED ORDER — HYDROCODONE-ACETAMINOPHEN 5-325 MG PO TABS: ORAL_TABLET | ORAL | 0 refills | Status: DC

## 2018-12-13 NOTE — Telephone Encounter (Signed)
Pt need a refill on hydrocodone. [facility] [address]

## 2018-12-27 ENCOUNTER — Other Ambulatory Visit: Payer: Self-pay

## 2018-12-27 ENCOUNTER — Ambulatory Visit (INDEPENDENT_AMBULATORY_CARE_PROVIDER_SITE_OTHER): Payer: Self-pay | Admitting: Family Medicine

## 2018-12-27 ENCOUNTER — Encounter: Payer: Self-pay | Admitting: Family Medicine

## 2018-12-27 VITALS — BP 122/80 | HR 79 | Temp 97.1°F | Resp 16

## 2018-12-27 DIAGNOSIS — E119 Type 2 diabetes mellitus without complications: Secondary | ICD-10-CM

## 2018-12-27 DIAGNOSIS — E781 Pure hyperglyceridemia: Secondary | ICD-10-CM

## 2018-12-27 DIAGNOSIS — E785 Hyperlipidemia, unspecified: Secondary | ICD-10-CM | POA: Insufficient documentation

## 2018-12-27 MED ORDER — GLIPIZIDE 5 MG PO TABS: 5.0000 mg | ORAL_TABLET | Freq: Every day | ORAL | 3 refills | Status: DC

## 2018-12-27 MED ORDER — ATORVASTATIN CALCIUM 20 MG PO TABS: 20.0000 mg | ORAL_TABLET | Freq: Every day | ORAL | 4 refills | Status: DC

## 2018-12-27 NOTE — Progress Notes (Signed)
Patient: Curtis [name] Male    DOB: July 22, 1955   63 y.o.   MRN: [id number] Visit Date: 12/27/2018  Today's Provider: [name], MD   Chief Complaint  Patient presents with  . Diabetes  . Hyperlipidemia   Subjective:     HPI  Diabetes Mellitus Type II, Follow-up:   Recent Labs       Lab Results  Component Value Date   HGBA1C 9.9 (A) 04/26/2018   HGBA1C >14.0 03/19/2018      Last seen for diabetes 4 months ago.  Management since then includes no change He reports excellent compliance with treatment. He is not having side effects.  Current symptoms include none  Home blood sugar records: 140-160  Episodes of hypoglycemia? no              Current insulin regiment: Is not on insulin Most Recent Eye Exam: 1 year ago Weight trend: stable Prior visit with dietician: No Current exercise: none Current diet habits: well balanced  Pertinent Labs: Lab Results  Component Value Date   CHOL 126 09/06/2018   HDL 22 (L) 09/06/2018   LDLCALC 51 09/06/2018   TRIG 264 (H) 09/06/2018   CHOLHDL 5.7 (H) 09/06/2018        Wt Readings from Last 3 Encounters:  08/27/18 159 lb (72.1 kg)  03/19/18 142 lb (64.4 kg)  01/23/15 153 lb 6.4 oz (69.6 kg)    ------------------------------------------------------------------------  Lipid/Cholesterol, Follow-up:   Last seen for this 4 months ago.  Management changes since that visit include prescribing Atorvastatin 20mg . Patient states that he has been working on lifestyle changes such as his diet and never filled prescription.    Last Lipid Panel: Lab Results  Component Value Date   CHOL 126 09/06/2018   HDL 22 (L) 09/06/2018   LDLCALC 51 09/06/2018   TRIG 264 (H) 09/06/2018   CHOLHDL 5.7 (H) 09/06/2018    Risk factors for vascular disease include diabetes mellitus and hypercholesterolemia      Wt Readings from Last 3 Encounters:  08/27/18 159 lb (72.1 kg)  03/19/18 142 lb (64.4 kg)  01/23/15  153 lb 6.4 oz (69.6 kg)     Allergies  Allergen Reactions  . Codeine   . Morphine And Related      Current Outpatient Medications:  .  glipiZIDE (GLUCOTROL) 5 MG tablet, Take 1 tablet (5 mg total) by mouth daily before breakfast., Disp: 30 tablet, Rfl: 3 .  HYDROcodone-acetaminophen (NORCO/VICODIN) 5-325 MG tablet, Twice daily for pain, Disp: 60 tablet, Rfl: 0 .  Lancet Devices MISC, Use to check blood sugar twice a day, Disp: 100 each, Rfl: 5 .  LEVITRA 20 MG tablet, TK 1/2- 1 T PO PRN, Disp: , Rfl: 11 .  atorvastatin (LIPITOR) 20 MG tablet, Take 1 tablet (20 mg total) by mouth every other day. Take one tablet every other day (Patient not taking: Reported on 12/27/2018), Disp: 30 tablet, Rfl: 4 .  montelukast (SINGULAIR) 10 MG tablet, Take 1 tablet (10 mg total) by mouth at bedtime. (Patient not taking: Reported on 08/27/2018), Disp: 30 tablet, Rfl: 5  Review of Systems  Constitutional: Negative.   Respiratory: Negative.   Cardiovascular: Negative.   Endocrine: Negative.   Musculoskeletal: Negative.     Social History   Tobacco Use  . Smoking status: Former Smoker    Packs/day: 0.75    Types: Cigarettes    Quit date: 04/26/2018    Years since quitting: 0.6  .  Smokeless tobacco: Never Used  . Tobacco comment: 2-3 ppd since 63yo. cut back to under a ppd since around 2010  Substance Use Topics  . Alcohol use: No    Alcohol/week: 0.0 standard drinks      Objective:   BP 122/80   Pulse 79   Temp (!) 97.1 F (36.2 C) (Oral)   Resp 16   SpO2 96%  Vitals:   12/27/18 1404  BP: 122/80  Pulse: 79  Resp: 16  Temp: (!) 97.1 F (36.2 C)  TempSrc: Oral  SpO2: 96%  There is no height or weight on file to calculate BMI.   Physical Exam  General appearance: Well developed, well nourished male, cooperative and in no acute distress Head: Normocephalic, without obvious abnormality, atraumatic Respiratory: Respirations even and unlabored, normal respiratory rate  Extremities: All extremities are intact.  Skin: Skin color, texture, turgor normal. No rashes seen  Psych: Appropriate mood and affect. Neurologic: Mental status: Alert, oriented to person, place, and time, thought content appropriate.       Assessment & Plan    1. Controlled type 2 diabetes mellitus without complication, without long-term current use of insulin (HCC) Doing well on glipizide. Given printed prescription to find best pharmacy price.  - HgB A1c  2. Hypertriglyceridemia Counseled on cardivascular benefits of taking a statin. Given printed prescription to take to find best pharmacy prices.  - atorvastatin (LIPITOR) 20 MG tablet; Take 1 tablet (20 mg total) by mouth daily.  Dispense: 90 tablet; Refill: 4    The entirety of the information documented in the History of Present Illness, Review of Systems and Physical Exam were personally obtained by me. Portions of this information were initially documented by Fonda Kinder, CMA and reviewed by me for thoroughness and accuracy.      Mila Merry, MD  [facility]

## 2018-12-27 NOTE — Patient Instructions (Addendum)
You can install the GoodRx app on your smart phone to find the lowest prices for generic medications.  

## 2018-12-28 LAB — HEMOGLOBIN A1C
Est. average glucose Bld gHb Est-mCnc: 154 mg/dL
Hgb A1c MFr Bld: 7 % — ABNORMAL HIGH (ref 4.8–5.6)

## 2019-01-20 ENCOUNTER — Ambulatory Visit: Payer: Self-pay | Admitting: *Deleted

## 2019-01-20 NOTE — Telephone Encounter (Signed)
Pt's wife called stating the pt has feet swelling and trouble breathing since his cholesterol medication was changed on 12/27/2018; the pt is having ongoing problems breathing which has worsened over the past week; his wife says this had previously been discussed with his PCP;  the pt is having to take deep breaths; he has been coughing, and feels like something is in his throat; recommendations made per triage protocol; she verbalized understanding; the pt sees Dr Caryn Section, [facility]; will route to office for notification.      Reason for Disposition . [1] MODERATE difficulty breathing (e.g., speaks in phrases, SOB even at rest, pulse 100-120) AND [2] NEW-onset or WORSE than normal  Answer Assessment - Initial Assessment Questions 1. RESPIRATORY STATUS: "Describe your breathing?" (e.g., wheezing, shortness of breath, unable to speak, severe coughing)      Has to take a deep breath; difficulty time eating due to SOB 2. ONSET: "When did this breathing problem begin?"      01/13/2019 3. PATTERN "Does the difficult breathing come and go, or has it been constant since it started?"     intermittent 4. SEVERITY: "How bad is your breathing?" (e.g., mild, moderate, severe)    - MILD: No SOB at rest, mild SOB with walking, speaks normally in sentences, can lay down, no retractions, pulse < 100.    - MODERATE: SOB at rest, SOB with minimal exertion and prefers to sit, cannot lie down flat, speaks in phrases, mild retractions, audible wheezing, pulse 100-120.    - SEVERE: Very SOB at rest, speaks in single words, struggling to breathe, sitting hunched forward, retractions, pulse > 120      moerate 5. RECURRENT SYMPTOM: "Have you had difficulty breathing before?" If so, ask: "When was the last time?" and "What happened that time?"     Constant; previously spoke with Dr Senaida Ores 6. CARDIAC HISTORY: "Do you have any history of heart disease?" (e.g., heart attack, angina, bypass surgery,  angioplasty)     Hx aneurysm 7. LUNG HISTORY: "Do you have any history of lung disease?"  (e.g., pulmonary embolus, asthma, emphysema)     no 8. CAUSE: "What do you think is causing the breathing problem?"      Medication changed 9. OTHER SYMPTOMS: "Do you have any other symptoms? (e.g., dizziness, runny nose, cough, chest pain, fever)    Feet swelling 10. PREGNANCY: "Is there any chance you are pregnant?" "When was your last menstrual period?"       N/A 11. TRAVEL: "Have you traveled out of the country in the last month?" (e.g., travel history, exposures)  Protocols used: BREATHING DIFFICULTY-A-AH

## 2019-01-24 NOTE — Telephone Encounter (Signed)
FYI

## 2019-02-08 ENCOUNTER — Other Ambulatory Visit: Payer: Self-pay | Admitting: Family Medicine

## 2019-02-08 DIAGNOSIS — M79604 Pain in right leg: Secondary | ICD-10-CM

## 2019-02-08 DIAGNOSIS — G8929 Other chronic pain: Secondary | ICD-10-CM

## 2019-02-08 MED ORDER — HYDROCODONE-ACETAMINOPHEN 5-325 MG PO TABS: ORAL_TABLET | ORAL | 0 refills | Status: DC

## 2019-02-08 NOTE — Telephone Encounter (Signed)
Medication Refill - Medication: HYDROcodone-acetaminophen (NORCO/VICODIN) 5-325 MG tablet    Has the patient contacted their pharmacy?  (Agent: If no, request that the patient contact the pharmacy for the refill.) (Agent: If yes, when and what did the pharmacy advise?)  Preferred Pharmacy (with phone number or street name): [id number] - [place], [place] - [address] AT [facility] OF [address]  Agent: Please be advised that RX refills may take up to 3 business days. We ask that you follow-up with your pharmacy.

## 2019-02-08 NOTE — Telephone Encounter (Signed)
Requested medication (s) are due for refill today: yes  Requested medication (s) are on the active medication list: yes  Last refill:  12/13/2018  Future visit scheduled: yes  Notes to clinic: not delegated    Requested Prescriptions  Pending Prescriptions Disp Refills   HYDROcodone-acetaminophen (NORCO/VICODIN) 5-325 MG tablet 60 tablet 0    Sig: Twice daily for pain      Not Delegated - Analgesics:  Opioid Agonist Combinations Failed - 02/08/2019  9:18 AM      Failed - This refill cannot be delegated      Failed - Urine Drug Screen completed in last 360 days.      Passed - Valid encounter within last 6 months    Recent Outpatient Visits           1 month ago Controlled type 2 diabetes mellitus without complication, without long-term current use of insulin (HCC)   [facility] Malva Limes, MD   5 months ago Controlled type 2 diabetes mellitus without complication, without long-term current use of insulin [facility])   [facility] Malva Limes, MD   9 months ago Uncontrolled type 2 diabetes mellitus with hyperglycemia [facility])   [facility] Malva Limes, MD   10 months ago Uncontrolled type 2 diabetes mellitus with hyperglycemia [facility])   [facility] Malva Limes, MD   11 months ago Chronic pain of right lower extremity   [facility] [place], [place]       Future Appointments             In 3 months Fisher, Demetrios Isaacs, MD [facility]

## 2019-03-22 ENCOUNTER — Other Ambulatory Visit: Payer: Self-pay | Admitting: Family Medicine

## 2019-03-22 DIAGNOSIS — G8929 Other chronic pain: Secondary | ICD-10-CM

## 2019-03-22 DIAGNOSIS — M79604 Pain in right leg: Secondary | ICD-10-CM

## 2019-03-22 MED ORDER — HYDROCODONE-ACETAMINOPHEN 5-325 MG PO TABS: ORAL_TABLET | ORAL | 0 refills | Status: DC

## 2019-03-22 NOTE — Telephone Encounter (Signed)
Medication Refill - Medication:  HYDROcodone-acetaminophen (NORCO/VICODIN) 5-325 MG tablet   Has the patient contacted their pharmacy? Yes advised to call office.   Preferred Pharmacy (with phone number or street name):  [id number] - [place], [place] - [address] AT [facility] OF [address] [facility] [facility] Phone:  [phone]  Fax:  [phone]       Agent: Please be advised that RX refills may take up to 3 business days. We ask that you follow-up with your pharmacy.

## 2019-03-25 ENCOUNTER — Inpatient Hospital Stay
Admission: EM | Admit: 2019-03-25 | Discharge: 2019-03-28 | DRG: 280 | Disposition: A | Discharge status: Other Acute Inpatient Hospital | Payer: Self-pay | Source: Ambulatory Visit | Attending: Internal Medicine | Admitting: Internal Medicine

## 2019-03-25 ENCOUNTER — Ambulatory Visit: Payer: Self-pay | Admitting: *Deleted

## 2019-03-25 ENCOUNTER — Emergency Department: Payer: Self-pay

## 2019-03-25 ENCOUNTER — Other Ambulatory Visit: Payer: Self-pay

## 2019-03-25 DIAGNOSIS — M79606 Pain in leg, unspecified: Secondary | ICD-10-CM | POA: Diagnosis present

## 2019-03-25 DIAGNOSIS — E876 Hypokalemia: Secondary | ICD-10-CM | POA: Diagnosis present

## 2019-03-25 DIAGNOSIS — R0602 Shortness of breath: Secondary | ICD-10-CM

## 2019-03-25 DIAGNOSIS — Z79891 Long term (current) use of opiate analgesic: Secondary | ICD-10-CM

## 2019-03-25 DIAGNOSIS — Z885 Allergy status to narcotic agent status: Secondary | ICD-10-CM

## 2019-03-25 DIAGNOSIS — G8929 Other chronic pain: Secondary | ICD-10-CM | POA: Diagnosis present

## 2019-03-25 DIAGNOSIS — E118 Type 2 diabetes mellitus with unspecified complications: Secondary | ICD-10-CM | POA: Diagnosis present

## 2019-03-25 DIAGNOSIS — N529 Male erectile dysfunction, unspecified: Secondary | ICD-10-CM | POA: Diagnosis present

## 2019-03-25 DIAGNOSIS — I5021 Acute systolic (congestive) heart failure: Secondary | ICD-10-CM | POA: Diagnosis present

## 2019-03-25 DIAGNOSIS — R609 Edema, unspecified: Secondary | ICD-10-CM

## 2019-03-25 DIAGNOSIS — Z9049 Acquired absence of other specified parts of digestive tract: Secondary | ICD-10-CM

## 2019-03-25 DIAGNOSIS — Z79899 Other long term (current) drug therapy: Secondary | ICD-10-CM

## 2019-03-25 DIAGNOSIS — Z7984 Long term (current) use of oral hypoglycemic drugs: Secondary | ICD-10-CM

## 2019-03-25 DIAGNOSIS — N179 Acute kidney failure, unspecified: Secondary | ICD-10-CM | POA: Diagnosis present

## 2019-03-25 DIAGNOSIS — E785 Hyperlipidemia, unspecified: Secondary | ICD-10-CM | POA: Diagnosis present

## 2019-03-25 DIAGNOSIS — R7989 Other specified abnormal findings of blood chemistry: Secondary | ICD-10-CM

## 2019-03-25 DIAGNOSIS — Z20822 Contact with and (suspected) exposure to covid-19: Secondary | ICD-10-CM | POA: Diagnosis present

## 2019-03-25 DIAGNOSIS — I5031 Acute diastolic (congestive) heart failure: Secondary | ICD-10-CM

## 2019-03-25 DIAGNOSIS — Z993 Dependence on wheelchair: Secondary | ICD-10-CM

## 2019-03-25 DIAGNOSIS — E11319 Type 2 diabetes mellitus with unspecified diabetic retinopathy without macular edema: Secondary | ICD-10-CM | POA: Diagnosis present

## 2019-03-25 DIAGNOSIS — I509 Heart failure, unspecified: Secondary | ICD-10-CM

## 2019-03-25 DIAGNOSIS — R778 Other specified abnormalities of plasma proteins: Secondary | ICD-10-CM

## 2019-03-25 DIAGNOSIS — I214 Non-ST elevation (NSTEMI) myocardial infarction: Principal | ICD-10-CM | POA: Diagnosis present

## 2019-03-25 DIAGNOSIS — I255 Ischemic cardiomyopathy: Secondary | ICD-10-CM | POA: Diagnosis present

## 2019-03-25 DIAGNOSIS — I2510 Atherosclerotic heart disease of native coronary artery without angina pectoris: Secondary | ICD-10-CM | POA: Diagnosis present

## 2019-03-25 DIAGNOSIS — D649 Anemia, unspecified: Secondary | ICD-10-CM | POA: Diagnosis present

## 2019-03-25 DIAGNOSIS — Z87891 Personal history of nicotine dependence: Secondary | ICD-10-CM

## 2019-03-25 DIAGNOSIS — N390 Urinary tract infection, site not specified: Secondary | ICD-10-CM | POA: Diagnosis present

## 2019-03-25 DIAGNOSIS — Z7289 Other problems related to lifestyle: Secondary | ICD-10-CM

## 2019-03-25 HISTORY — DX: Non-ST elevation (NSTEMI) myocardial infarction: I21.4

## 2019-03-25 LAB — CBC WITH DIFFERENTIAL/PLATELET
Abs Immature Granulocytes: 0.04 10*3/uL (ref 0.00–0.07)
Basophils Absolute: 0 10*3/uL (ref 0.0–0.1)
Basophils Relative: 0 %
Eosinophils Absolute: 0.2 10*3/uL (ref 0.0–0.5)
Eosinophils Relative: 2 %
HCT: 42.8 % (ref 39.0–52.0)
Hemoglobin: 13.7 g/dL (ref 13.0–17.0)
Immature Granulocytes: 1 %
Lymphocytes Relative: 15 %
Lymphs Abs: 1.3 10*3/uL (ref 0.7–4.0)
MCH: 27.7 pg (ref 26.0–34.0)
MCHC: 32 g/dL (ref 30.0–36.0)
MCV: 86.6 fL (ref 80.0–100.0)
Monocytes Absolute: 1.2 10*3/uL — ABNORMAL HIGH (ref 0.1–1.0)
Monocytes Relative: 14 %
Neutro Abs: 5.9 10*3/uL (ref 1.7–7.7)
Neutrophils Relative %: 68 %
Platelets: 185 10*3/uL (ref 150–400)
RBC: 4.94 MIL/uL (ref 4.22–5.81)
RDW: 13.9 % (ref 11.5–15.5)
WBC: 8.7 10*3/uL (ref 4.0–10.5)
nRBC: 0 % (ref 0.0–0.2)

## 2019-03-25 LAB — COMPREHENSIVE METABOLIC PANEL
ALT: 13 U/L (ref 0–44)
AST: 18 U/L (ref 15–41)
Albumin: 3.6 g/dL (ref 3.5–5.0)
Alkaline Phosphatase: 78 U/L (ref 38–126)
Anion gap: 10 (ref 5–15)
BUN: 19 mg/dL (ref 8–23)
CO2: 27 mmol/L (ref 22–32)
Calcium: 9.3 mg/dL (ref 8.9–10.3)
Chloride: 94 mmol/L — ABNORMAL LOW (ref 98–111)
Creatinine, Ser: 0.97 mg/dL (ref 0.61–1.24)
GFR calc Af Amer: >60 mL/min (ref >60)
GFR calc non Af Amer: >60 mL/min (ref >60)
Glucose, Bld: 183 mg/dL — ABNORMAL HIGH (ref 70–99)
Potassium: 3.2 mmol/L — ABNORMAL LOW (ref 3.5–5.1)
Sodium: 131 mmol/L — ABNORMAL LOW (ref 135–145)
Total Bilirubin: 0.9 mg/dL (ref 0.3–1.2)
Total Protein: 8.3 g/dL — ABNORMAL HIGH (ref 6.5–8.1)

## 2019-03-25 LAB — URINALYSIS, ROUTINE W REFLEX MICROSCOPIC
Bilirubin Urine: NEGATIVE
Glucose, UA: 50 mg/dL — AB
Ketones, ur: NEGATIVE mg/dL
Nitrite: NEGATIVE
Protein, ur: >=300 mg/dL — AB
Specific Gravity, Urine: 1.015 (ref 1.005–1.030)
WBC, UA: >50 WBC/hpf — ABNORMAL HIGH (ref 0–5)
pH: 6 (ref 5.0–8.0)

## 2019-03-25 LAB — TROPONIN I (HIGH SENSITIVITY): Troponin I (High Sensitivity): 781 ng/L (ref <18)

## 2019-03-25 LAB — BRAIN NATRIURETIC PEPTIDE: B Natriuretic Peptide: 650 pg/mL — ABNORMAL HIGH (ref 0.0–100.0)

## 2019-03-25 MED ORDER — ASPIRIN EC 81 MG PO TBEC
81.0000 mg | DELAYED_RELEASE_TABLET | Freq: Every day | ORAL | Status: DC
  Administered 2019-03-26 – 2019-03-27 (×2): 81 mg via ORAL
  Filled 2019-03-25: qty 1

## 2019-03-25 MED ORDER — ASPIRIN 81 MG PO CHEW
324.0000 mg | CHEWABLE_TABLET | Freq: Once | ORAL | Status: AC
  Administered 2019-03-25: 324 mg via ORAL
  Filled 2019-03-25: qty 4

## 2019-03-25 MED ORDER — ATORVASTATIN CALCIUM 20 MG PO TABS
20.0000 mg | ORAL_TABLET | Freq: Every day | ORAL | Status: DC
  Administered 2019-03-26 – 2019-03-27 (×2): 20 mg via ORAL
  Filled 2019-03-25 (×2): qty 1

## 2019-03-25 MED ORDER — ONDANSETRON HCL 4 MG/2ML IJ SOLN: 4.0000 mg | Freq: Four times a day (QID) | INTRAMUSCULAR | Status: DC | PRN

## 2019-03-25 MED ORDER — FUROSEMIDE 10 MG/ML IJ SOLN
60.0000 mg | Freq: Two times a day (BID) | INTRAMUSCULAR | Status: DC
  Administered 2019-03-26 – 2019-03-27 (×3): 60 mg via INTRAVENOUS
  Filled 2019-03-25 (×3): qty 6

## 2019-03-25 MED ORDER — SODIUM CHLORIDE 0.9 % IV SOLN
1.0000 g | Freq: Once | INTRAVENOUS | Status: AC
  Administered 2019-03-25: 1 g via INTRAVENOUS

## 2019-03-25 MED ORDER — NITROGLYCERIN 0.4 MG SL SUBL: 0.4000 mg | SUBLINGUAL_TABLET | SUBLINGUAL | Status: DC | PRN

## 2019-03-25 MED ORDER — HEPARIN (PORCINE) 25000 UT/250ML-% IV SOLN
1650.0000 [IU]/h | INTRAVENOUS | Status: DC
  Administered 2019-03-26: 950 [IU]/h via INTRAVENOUS
  Administered 2019-03-26: 1200 [IU]/h via INTRAVENOUS
  Administered 2019-03-27: 1500 [IU]/h via INTRAVENOUS
  Administered 2019-03-28: 1650 [IU]/h via INTRAVENOUS
  Filled 2019-03-25 (×4): qty 250

## 2019-03-25 MED ORDER — HEPARIN BOLUS VIA INFUSION
4000.0000 [IU] | Freq: Once | INTRAVENOUS | Status: AC
  Administered 2019-03-26: 4000 [IU] via INTRAVENOUS
  Filled 2019-03-25: qty 4000

## 2019-03-25 MED ORDER — INSULIN ASPART 100 UNIT/ML ~~LOC~~ SOLN
0.0000 [IU] | Freq: Three times a day (TID) | SUBCUTANEOUS | Status: DC
  Administered 2019-03-26: 2 [IU] via SUBCUTANEOUS
  Administered 2019-03-26: 3 [IU] via SUBCUTANEOUS
  Administered 2019-03-26: 2 [IU] via SUBCUTANEOUS
  Administered 2019-03-27: 3 [IU] via SUBCUTANEOUS
  Administered 2019-03-27: 2 [IU] via SUBCUTANEOUS
  Administered 2019-03-27: 3 [IU] via SUBCUTANEOUS
  Filled 2019-03-25 (×6): qty 1

## 2019-03-25 MED ORDER — FUROSEMIDE 10 MG/ML IJ SOLN
60.0000 mg | Freq: Once | INTRAMUSCULAR | Status: AC
  Administered 2019-03-25: 60 mg via INTRAVENOUS
  Filled 2019-03-25: qty 8

## 2019-03-25 MED ORDER — LISINOPRIL 5 MG PO TABS
5.0000 mg | ORAL_TABLET | Freq: Every day | ORAL | Status: DC
  Administered 2019-03-26 – 2019-03-27 (×2): 5 mg via ORAL
  Filled 2019-03-25 (×2): qty 1

## 2019-03-25 MED ORDER — POTASSIUM CHLORIDE CRYS ER 20 MEQ PO TBCR
40.0000 meq | EXTENDED_RELEASE_TABLET | Freq: Once | ORAL | Status: AC
  Administered 2019-03-25: 40 meq via ORAL
  Filled 2019-03-25: qty 2

## 2019-03-25 MED ORDER — ACETAMINOPHEN 325 MG PO TABS: 650.0000 mg | ORAL_TABLET | ORAL | Status: DC | PRN

## 2019-03-25 NOTE — Telephone Encounter (Signed)
He will almost certainly need xray and labs done which we won't be able to get done today. If he is having shortness of breath now then he needs to go to the ER. Otherwise he should go to an urgent care.

## 2019-03-25 NOTE — Telephone Encounter (Signed)
[name] advised once again that patient needed to go to the ER or UC.

## 2019-03-25 NOTE — ED Notes (Signed)
Critical troponin of 781 called from lab. Dr. Derrill Kay notified, no new verbal orders received. Primary RN ally notified as well.

## 2019-03-25 NOTE — H&P (Signed)
History and Physical    Curtis [name] PTW:[id number] DOB: 03-04-1955 DOA: 03/25/2019  PCP: Malva Limes, MD   Patient coming from: home  I have personally briefly reviewed patient's old medical records in [facility]  Chief Complaint: Shortness of breath x2 months, worsened 1 day  HPI: Curtis [name] is a 64 y.o. male with medical history significant for type 2 diabetes, wheelchair-bound secondary to old injury but independent in ADLs who presents to the emergency room with shortness of breath with exertion, intermittent for the past 2 months, worsening of late, and now associated with bilateral lower extremity edema that has been gradually worsening, and orthopnea.  Patient denies chest pain, nausea vomiting or diaphoresis.  He denies pain in the lower extremities.  He denies cough fever or chills.  He spoke with his primary care provider via teleconsult and advised him to come into the emergency room  ED Course: On arrival to the emergency room, vitals were within normal limits.  Blood pressure 159/80.  EKG showed T wave inversion in inferior leads and chest x-ray showed cardiomegaly with probable mild vascular congestion.  Troponin returned at 781 and BNP 650.  His other blood work was for the most part unremarkable.  Patient was given 325 mg chewable aspirin, IV Lasix and started on a heparin infusion.  Of note, urinalysis in the ER was consistent with UTI.  Hospitalist consulted for admission.  Review of Systems: As per HPI otherwise 10 point review of systems negative.    Past Medical History:  Diagnosis Date  . Chronic pain of right hip   . Erectile dysfunction     Past Surgical History:  Procedure Laterality Date  . CHOLECYSTECTOMY  05/12/2011  . HAND RECONSTRUCTION    . HEMORRHOID SURGERY  10/31/2011   [facility]     reports that he quit smoking about 10 months ago. His smoking use included cigarettes. He smoked 0.75 packs per day. He has never used smokeless tobacco. He  reports current alcohol use. He reports that he does not use drugs.  Allergies  Allergen Reactions  . Codeine   . Morphine And Related     Family History  Family history unknown: Yes     Prior to Admission medications   Medication Sig Start Date End Date Taking? Authorizing Provider  atorvastatin (LIPITOR) 20 MG tablet Take 1 tablet (20 mg total) by mouth daily. 12/27/18   Malva Limes, MD  glipiZIDE (GLUCOTROL) 5 MG tablet Take 1 tablet (5 mg total) by mouth daily before breakfast. 12/27/18   Malva Limes, MD  HYDROcodone-acetaminophen (NORCO/VICODIN) 5-325 MG tablet Twice daily for pain 03/22/19   Malva Limes, MD  Lancet Devices MISC Use to check blood sugar twice a day 04/26/18   Malva Limes, MD  LEVITRA 20 MG tablet TK 1/2- 1 T PO PRN 01/10/15   [provider]  montelukast (SINGULAIR) 10 MG tablet Take 1 tablet (10 mg total) by mouth at bedtime. Patient not taking: Reported on 08/27/2018 04/26/18   Malva Limes, MD    Physical Exam: Vitals:   03/25/19 2124 03/25/19 2125 03/25/19 2200  BP: (!) 159/80  (!) 164/94  Pulse: (!) 48  90  Resp: 20    Temp: (!) 97.5 F (36.4 C)    TempSrc: Oral    SpO2: 99%  95%  Weight:  68.5 kg   Height:  5\' 8"  (1.727 m)      Vitals:   03/25/19  2124 03/25/19 2125 03/25/19 2200  BP: (!) 159/80  (!) 164/94  Pulse: (!) 48  90  Resp: 20    Temp: (!) 97.5 F (36.4 C)    TempSrc: Oral    SpO2: 99%  95%  Weight:  68.5 kg   Height:  5\' 8"  (1.727 m)     Constitutional: Alert and awake, oriented x3, not in any acute distress. Eyes: PERLA, EOMI, irises appear normal, anicteric sclera,  ENMT: external ears and nose appear normal, normal hearing             Lips appears normal, oropharynx mucosa, tongue, posterior pharynx appear normal  Neck: neck appears normal, no masses, normal ROM, no thyromegaly, no JVD  CVS: S1-S2 clear, no murmur rubs or gallops,  , no carotid bruits, pedal pulses palpable, 2-3+LE  edema Respiratory:  clear to auscultation bilaterally, no wheezing,few faint rales  Respiratory effort normal. No accessory muscle use.  Abdomen: soft nontender, nondistended, normal bowel sounds, no hepatosplenomegaly, no hernias Musculoskeletal: : no cyanosis, clubbing , no contractures or atrophy Neuro: Cranial nerves II-XII intact, sensation, reflexes normal, strength Psych: judgement and insight appear normal, stable mood and affect,  Skin: no rashes or lesions or ulcers, no induration or nodules   Labs on Admission: I have personally reviewed following labs and imaging studies  CBC: Recent Labs  Lab 03/25/19 2131  WBC 8.7  NEUTROABS 5.9  HGB 13.7  HCT 42.8  MCV 86.6  PLT 185   Basic Metabolic Panel: Recent Labs  Lab 03/25/19 2131  NA 131*  K 3.2*  CL 94*  CO2 27  GLUCOSE 183*  BUN 19  CREATININE 0.97  CALCIUM 9.3   GFR: Estimated Creatinine Clearance: 74.4 mL/min (by C-G formula based on SCr of 0.97 mg/dL). Liver Function Tests: Recent Labs  Lab 03/25/19 2131  AST 18  ALT 13  ALKPHOS 78  BILITOT 0.9  PROT 8.3*  ALBUMIN 3.6   No results for input(s): LIPASE, AMYLASE in the last 168 hours. No results for input(s): AMMONIA in the last 168 hours. Coagulation Profile: No results for input(s): INR, PROTIME in the last 168 hours. Cardiac Enzymes: No results for input(s): CKTOTAL, CKMB, CKMBINDEX, TROPONINI in the last 168 hours. BNP (last 3 results) No results for input(s): PROBNP in the last 8760 hours. HbA1C: No results for input(s): HGBA1C in the last 72 hours. CBG: No results for input(s): GLUCAP in the last 168 hours. Lipid Profile: No results for input(s): CHOL, HDL, LDLCALC, TRIG, CHOLHDL, LDLDIRECT in the last 72 hours. Thyroid Function Tests: No results for input(s): TSH, T4TOTAL, FREET4, T3FREE, THYROIDAB in the last 72 hours. Anemia Panel: No results for input(s): VITAMINB12, FOLATE, FERRITIN, TIBC, IRON, RETICCTPCT in the last 72  hours. Urine analysis:    Component Value Date/Time   COLORURINE YELLOW (A) 03/25/2019 2132   APPEARANCEUR HAZY (A) 03/25/2019 2132   LABSPEC 1.015 03/25/2019 2132   PHURINE 6.0 03/25/2019 2132   GLUCOSEU 50 (A) 03/25/2019 2132   HGBUR MODERATE (A) 03/25/2019 2132   BILIRUBINUR NEGATIVE 03/25/2019 2132   BILIRUBINUR negative 03/10/2016 1659   KETONESUR NEGATIVE 03/25/2019 2132   PROTEINUR >=300 (A) 03/25/2019 2132   UROBILINOGEN 0.2 03/10/2016 1659   UROBILINOGEN 0.2 05/19/2008 1302   NITRITE NEGATIVE 03/25/2019 2132   LEUKOCYTESUR MODERATE (A) 03/25/2019 2132    Radiological Exams on Admission: DG Chest 1 View  Result Date: 03/25/2019 CLINICAL DATA:  64 year old male with shortness of breath. EXAM: CHEST  1 VIEW COMPARISON:  Chest radiograph dated 04/25/2008. FINDINGS: Mild cardiomegaly with probable mild vascular congestion. There is background of emphysema. No focal consolidation, pleural effusion, or pneumothorax. Atherosclerotic calcification of the aortic arch. No acute osseous pathology. IMPRESSION: Cardiomegaly with probable mild vascular congestion. Electronically Signed   By: Anner Crete M.D.   On: 03/25/2019 21:59    EKG: Independently reviewed.   Assessment/Plan Principal Problem:   Acute CHF (congestive heart failure) (HCC)   NSTEMI (non-ST elevated myocardial infarction) ([place]) -Patient presents with shortness of breath, lower extremity edema, and denies chest pain -Troponin 781, BNP 650 with pulmonary vascular congestion on chest x-ray and EKG showing possible T wave inversion in inferior leads -Continue aspirin.  Started on ACE inhibitor and beta-blocker -Continue heparin infusion -Echocardiogram in the a.m. -Cardiology consult    UTI (urinary tract infection) -No dysuric symptoms but urinalysis showed moderate bacteria and large leukocyte esterase -IV Rocephin -Follow urine culture    Type 2 diabetes mellitus with complication, without long-term current  use of insulin (HCC) -Sliding scale coverage of insulin -Follow hemoglobin A1c    Wheelchair bound -Increase nursing assistance as needed    DVT prophylaxis: Patient currently on full dose aspirin Code Status: full code  Family Communication:  none  Disposition Plan: Back to previous home environment Consults called: Cardiology, Dr. Rayann Heman Status:inp    Athena Masse MD [facility]     03/25/2019, 11:35 PM

## 2019-03-25 NOTE — ED Notes (Signed)
Pt states that he has to move the [place] since it is a wheel chair accessible for driving. Pt's wife unable to move [place].

## 2019-03-25 NOTE — ED Provider Notes (Signed)
EKG is reviewed by me at 2130 Heart rate 95 QRS 120 QTc 480 Normal sinus rhythm, there is T wave inversions inferiorly, repolarization-like abnormality seen in V3 V4 V2.  Does not appear to diagnostic of STEMI.  No old for comparison.   Sharyn Creamer, MD 03/25/19 2132

## 2019-03-25 NOTE — ED Triage Notes (Signed)
Pt sent to the er by [facility]. Pt is having sob. Pt has no lung disease hx. Pt has swelling in legs bilaterally. Pt is wheelchair bound.

## 2019-03-25 NOTE — ED Provider Notes (Signed)
[facility] Emergency Department Provider Note  ____________________________________________   I have reviewed the triage vital signs and the nursing notes.   HISTORY  Chief Complaint Shortness of Breath   History limited by: Not Limited   HPI Curtis [name] is a 64 y.o. male who presents to the emergency department today because of concern for shortness of breath and swelling in his legs.  The patient states that his symptoms have been going on for a couple of months.  It is somewhat unclear why he decided to contact his doctors about this today.  He denies any acute change in his symptoms.  He states that yesterday was a little bit worse than today although it sounds like he has worse days from time to time.  However when he called his doctor's office today they did recommend he present to the emergency department.  Denies any chest pain.  Denies any significant pain in his legs with the swelling.  Records reviewed. Per medical record review patient has a history of dyslipidemia, DM.   Past Medical History:  Diagnosis Date  . Chronic pain of right hip   . Erectile dysfunction     Patient Active Problem List   Diagnosis Date Noted  . Hypertriglyceridemia 12/27/2018  . Controlled type 2 diabetes mellitus without complication, without long-term current use of insulin ([place]) 08/27/2018  . Microalbuminuria 08/27/2018  . Dyslipidemia 08/27/2018  . Chronic pain of lower extremity 01/23/2015  . History of kidney stones 01/23/2015  . ED (erectile dysfunction) 01/23/2015    Past Surgical History:  Procedure Laterality Date  . CHOLECYSTECTOMY  05/12/2011  . HAND RECONSTRUCTION    . HEMORRHOID SURGERY  10/31/2011   [facility]    Prior to Admission medications   Medication Sig Start Date End Date Taking? Authorizing Provider  atorvastatin (LIPITOR) 20 MG tablet Take 1 tablet (20 mg total) by mouth daily. 12/27/18   Birdie Sons, MD  glipiZIDE (GLUCOTROL) 5 MG  tablet Take 1 tablet (5 mg total) by mouth daily before breakfast. 12/27/18   Birdie Sons, MD  HYDROcodone-acetaminophen (NORCO/VICODIN) 5-325 MG tablet Twice daily for pain 03/22/19   Birdie Sons, MD  Lancet Devices MISC Use to check blood sugar twice a day 04/26/18   Birdie Sons, MD  LEVITRA 20 MG tablet TK 1/2- 1 T PO PRN 01/10/15   [provider]  montelukast (SINGULAIR) 10 MG tablet Take 1 tablet (10 mg total) by mouth at bedtime. Patient not taking: Reported on 08/27/2018 04/26/18   Birdie Sons, MD    Allergies Codeine and Morphine and related  Family History  Family history unknown: Yes    Social History Social History   Tobacco Use  . Smoking status: Former Smoker    Packs/day: 0.75    Types: Cigarettes    Quit date: 04/26/2018    Years since quitting: 0.9  . Smokeless tobacco: Never Used  . Tobacco comment: 2-3 ppd since 64yo. cut back to under a ppd since around 2010  Substance Use Topics  . Alcohol use: Yes    Alcohol/week: 0.0 standard drinks    Comment: social  . Drug use: No    Review of Systems Constitutional: No fever/chills Eyes: No visual changes. ENT: No sore throat. Cardiovascular: Denies chest pain. Respiratory: Positive for shortness of breath. Gastrointestinal: No abdominal pain.  No nausea, no vomiting.  No diarrhea.   Genitourinary: Negative for dysuria. Musculoskeletal: Positive for leg swelling.  Skin: Negative  for rash. Neurological: Negative for headaches, focal weakness or numbness.  ____________________________________________   PHYSICAL EXAM:  VITAL SIGNS: ED Triage Vitals  Enc Vitals Group     BP 03/25/19 2124 (!) 159/80     Pulse Rate 03/25/19 2124 (!) 48     Resp 03/25/19 2124 20     Temp 03/25/19 2124 (!) 97.5 F (36.4 C)     Temp Source 03/25/19 2124 Oral     SpO2 03/25/19 2124 99 %     Weight 03/25/19 2125 151 lb (68.5 kg)     Height 03/25/19 2125 5\' 8"  (1.727 m)     Head Circumference --      Peak  Flow --      Pain Score 03/25/19 2125 0   Constitutional: Alert and oriented.  Eyes: Conjunctivae are normal.  ENT      Head: Normocephalic and atraumatic.      Nose: No congestion/rhinnorhea.      Mouth/Throat: Mucous membranes are moist.      Neck: No stridor. Hematological/Lymphatic/Immunilogical: No cervical lymphadenopathy. Cardiovascular: Normal rate, regular rhythm.  No murmurs, rubs, or gallops.  Respiratory: Normal respiratory effort without tachypnea nor retractions.  Gastrointestinal: Soft and non tender. No rebound. No guarding.  Genitourinary: Deferred Musculoskeletal: Normal range of motion in all extremities. Bilateral lower extremity edema.  Neurologic:  Normal speech and language. Skin:  Skin is warm, dry and intact. No rash noted. Psychiatric: Mood and affect are normal. Speech and behavior are normal. Patient exhibits appropriate insight and judgment.  ____________________________________________    LABS (pertinent positives/negatives)  CMP na 131, k 3.2, glu 183, cr 0.97 Trop 781 BNP 650.0 CBC wbc 8.7, hgb 13.7, plt 185 ____________________________________________    RADIOLOGY  CXR Cardiomegaly with probable vascular congestion  ____________________________________________   PROCEDURES  Procedures  ____________________________________________   INITIAL IMPRESSION / ASSESSMENT AND PLAN / ED COURSE  Pertinent labs & imaging results that were available during my care of the patient were reviewed by me and considered in my medical decision making (see chart for details).   Patient presented to the emergency department today because of continued shortness of breath and leg swelling. Work up is concerning for cardiomegaly and elevated BNP possibly relating to heart failure. Troponin is also elevated. Unclear if patient did have a distinct event however does not describe one recently that would suggest recent heart attack. Regardless patient will  require admission. Discussed findings with patient.    ____________________________________________   FINAL CLINICAL IMPRESSION(S) / ED DIAGNOSES  Final diagnoses:  SOB (shortness of breath)  Elevated troponin  Elevated brain natriuretic peptide (BNP) level  Edema, unspecified type     Note: This dictation was prepared with Dragon dictation. Any transcriptional errors that result from this process are unintentional     2126, MD 03/26/19 1635

## 2019-03-25 NOTE — Progress Notes (Signed)
ANTICOAGULATION CONSULT NOTE - Initial Consult  Pharmacy Consult for Heparin Indication: chest pain/ACS  Allergies  Allergen Reactions  . Codeine   . Morphine And Related     Patient Measurements: Height: 5\' 8"  (172.7 cm) Weight: 151 lb (68.5 kg) IBW/kg (Calculated) : 68.4 HEPARIN DW (KG): 68.5  Vital Signs: Temp: 97.5 F (36.4 C) (03/05 2124) Temp Source: Oral (03/05 2124) BP: 164/94 (03/05 2200) Pulse Rate: 90 (03/05 2200)  Labs: Recent Labs    03/25/19 2131  HGB 13.7  HCT 42.8  PLT 185  CREATININE 0.97  TROPONINIHS 781*    Estimated Creatinine Clearance: 74.4 mL/min (by C-G formula based on SCr of 0.97 mg/dL).  Medical History: Past Medical History:  Diagnosis Date  . Chronic pain of right hip   . Erectile dysfunction    Medications:  (Not in a hospital admission)   Assessment: Pharmacy consulted for Heparin for ACS.  No anticoagulants PTA per current med list.  Baseline labs ordered.   Goal of Therapy:  Heparin level 0.3-0.7 units/ml Monitor platelets by anticoagulation protocol: Yes   Plan:  Heparin 4000 units bolus x 1 then infusion at 950 units/hr Check HL ~ 6 hours after heparin started  2132 A 03/25/2019,11:45 PM

## 2019-03-25 NOTE — Telephone Encounter (Signed)
Pt calling with complaints of SOB that has occurred off and on since December. Pt has not been tested for Covid previously and has not been in contact with any one that tested positive. Pt's wife states that the patient has not been out of the house and is wheelchair bound. Pt's wife concerned about bilateral leg and feet swelling. Pt's wife states that the pt's feet are swollen like balloons filled with fluid. Pt's wife states that the swelling goes up in the the calf area as well and it looks like the patient does not have any ankles. Skin around the area of swelling is red/purple in color.Pt's wife offered virtual appointment but she refuses at this time. Pt's wife requesting in person visit so that someone can look at the swelling in the pt's feet and legs.Pt's wife does not want to go to the Emergency room and states that it expensive. Pt's wife states that the patient does not have insurance. Spoke with Marcelino Duster, a nurse in the office who will forward information to Dr. Sherrie Mustache to review. Pt's wife advised that she would receive a call back with the PCP's recommendation. Verbalized understanding.  Reason for Disposition . [1] MILD difficulty breathing (e.g., minimal/no SOB at rest, SOB with walking, pulse <100) AND [2] NEW-onset or WORSE than normal  Answer Assessment - Initial Assessment Questions 1. RESPIRATORY STATUS: "Describe your breathing?" (e.g., wheezing, shortness of breath, unable to speak, severe coughing)      Shortness of breath off  2. ONSET: "When did this breathing problem begin?"      Quit smoking in December, has experienced 3. PATTERN "Does the difficult breathing come and go, or has it been constant since it started?"      Off and on all the thime 4. SEVERITY: "How bad is your breathing?" (e.g., mild, moderate, severe)    - MILD: No SOB at rest, mild SOB with walking, speaks normally in sentences, can lay down, no retractions, pulse < 100.    - MODERATE: SOB at rest, SOB with  minimal exertion and prefers to sit, cannot lie down flat, speaks in phrases, mild retractions, audible wheezing, pulse 100-120.    - SEVERE: Very SOB at rest, speaks in single words, struggling to breathe, sitting hunched forward, retractions, pulse > 120      Sometimes  5. RECURRENT SYMPTOM: "Have you had difficulty breathing before?" If so, ask: "When was the last time?" and "What happened that time?"      Has experienced  6. CARDIAC HISTORY: "Do you have any history of heart disease?" (e.g., heart attack, angina, bypass surgery, angioplasty)      No 7. LUNG HISTORY: "Do you have any history of lung disease?"  (e.g., pulmonary embolus, asthma, emphysema)     No 8. CAUSE: "What do you think is causing the breathing problem?"      *No Answer* 9. OTHER SYMPTOMS: "Do you have any other symptoms? (e.g., dizziness, runny nose, cough, chest pain, fever)    Chest pain with taking  10. PREGNANCY: "Is there any chance you are pregnant?" "When was your last menstrual period?"        11. TRAVEL: "Have you traveled out of the country in the last month?" (e.g., travel history, exposures)       n/a  Protocols used: BREATHING DIFFICULTY-A-AH

## 2019-03-26 ENCOUNTER — Other Ambulatory Visit: Payer: Self-pay

## 2019-03-26 ENCOUNTER — Inpatient Hospital Stay (HOSPITAL_COMMUNITY)
Admit: 2019-03-26 | Discharge: 2019-03-26 | Disposition: A | Discharge status: Home / Self Care | Payer: Self-pay | Attending: Internal Medicine | Admitting: Internal Medicine

## 2019-03-26 DIAGNOSIS — I5031 Acute diastolic (congestive) heart failure: Secondary | ICD-10-CM

## 2019-03-26 DIAGNOSIS — E876 Hypokalemia: Secondary | ICD-10-CM

## 2019-03-26 DIAGNOSIS — I509 Heart failure, unspecified: Secondary | ICD-10-CM

## 2019-03-26 DIAGNOSIS — E118 Type 2 diabetes mellitus with unspecified complications: Secondary | ICD-10-CM

## 2019-03-26 LAB — TROPONIN I (HIGH SENSITIVITY)
Troponin I (High Sensitivity): 629 ng/L (ref <18)
Troponin I (High Sensitivity): 787 ng/L (ref <18)

## 2019-03-26 LAB — BASIC METABOLIC PANEL
Anion gap: 7 (ref 5–15)
BUN: 18 mg/dL (ref 8–23)
CO2: 30 mmol/L (ref 22–32)
Calcium: 8.1 mg/dL — ABNORMAL LOW (ref 8.9–10.3)
Chloride: 98 mmol/L (ref 98–111)
Creatinine, Ser: 1 mg/dL (ref 0.61–1.24)
GFR calc Af Amer: >60 mL/min (ref >60)
GFR calc non Af Amer: >60 mL/min (ref >60)
Glucose, Bld: 192 mg/dL — ABNORMAL HIGH (ref 70–99)
Potassium: 3.2 mmol/L — ABNORMAL LOW (ref 3.5–5.1)
Sodium: 135 mmol/L (ref 135–145)

## 2019-03-26 LAB — CBC
HCT: 38.9 % — ABNORMAL LOW (ref 39.0–52.0)
Hemoglobin: 12.9 g/dL — ABNORMAL LOW (ref 13.0–17.0)
MCH: 28.4 pg (ref 26.0–34.0)
MCHC: 33.2 g/dL (ref 30.0–36.0)
MCV: 85.7 fL (ref 80.0–100.0)
Platelets: 175 10*3/uL (ref 150–400)
RBC: 4.54 MIL/uL (ref 4.22–5.81)
RDW: 13.3 % (ref 11.5–15.5)
WBC: 8.4 10*3/uL (ref 4.0–10.5)
nRBC: 0 % (ref 0.0–0.2)

## 2019-03-26 LAB — LIPID PANEL
Cholesterol: 149 mg/dL (ref 0–200)
HDL: 21 mg/dL — ABNORMAL LOW (ref >40)
LDL Cholesterol: 69 mg/dL (ref 0–99)
Total CHOL/HDL Ratio: 7.1 RATIO
Triglycerides: 296 mg/dL — ABNORMAL HIGH (ref <150)
VLDL: 59 mg/dL — ABNORMAL HIGH (ref 0–40)

## 2019-03-26 LAB — RESPIRATORY PANEL BY RT PCR (FLU A&B, COVID)
Influenza A by PCR: NEGATIVE
Influenza B by PCR: NEGATIVE
SARS Coronavirus 2 by RT PCR: NEGATIVE

## 2019-03-26 LAB — HEPARIN LEVEL (UNFRACTIONATED)
Heparin Unfractionated: <0.1 IU/mL — ABNORMAL LOW (ref 0.30–0.70)
Heparin Unfractionated: 0.2 IU/mL — ABNORMAL LOW (ref 0.30–0.70)
Heparin Unfractionated: 0.3 IU/mL (ref 0.30–0.70)

## 2019-03-26 LAB — GLUCOSE, CAPILLARY
Glucose-Capillary: 139 mg/dL — ABNORMAL HIGH (ref 70–99)
Glucose-Capillary: 177 mg/dL — ABNORMAL HIGH (ref 70–99)
Glucose-Capillary: 144 mg/dL — ABNORMAL HIGH (ref 70–99)
Glucose-Capillary: 169 mg/dL — ABNORMAL HIGH (ref 70–99)

## 2019-03-26 LAB — MAGNESIUM: Magnesium: 1.8 mg/dL (ref 1.7–2.4)

## 2019-03-26 LAB — ECHOCARDIOGRAM COMPLETE
Height: 68 in
Weight: 2584 oz

## 2019-03-26 LAB — HIV ANTIBODY (ROUTINE TESTING W REFLEX): HIV Screen 4th Generation wRfx: NONREACTIVE

## 2019-03-26 LAB — HEMOGLOBIN A1C
Hgb A1c MFr Bld: 7.7 % — ABNORMAL HIGH (ref 4.8–5.6)
Mean Plasma Glucose: 174.29 mg/dL

## 2019-03-26 LAB — APTT: aPTT: 39 seconds — ABNORMAL HIGH (ref 24–36)

## 2019-03-26 LAB — PROTIME-INR
INR: 1.2 (ref 0.8–1.2)
Prothrombin Time: 14.6 seconds (ref 11.4–15.2)

## 2019-03-26 MED ORDER — HEPARIN (PORCINE) 25000 UT/250ML-% IV SOLN: 1200.0000 [IU]/h | INTRAVENOUS | Status: DC

## 2019-03-26 MED ORDER — HEPARIN BOLUS VIA INFUSION
1000.0000 [IU] | Freq: Once | INTRAVENOUS | Status: AC
  Administered 2019-03-26: 1000 [IU] via INTRAVENOUS
  Filled 2019-03-26: qty 1000

## 2019-03-26 MED ORDER — HEPARIN BOLUS VIA INFUSION
3000.0000 [IU] | Freq: Once | INTRAVENOUS | Status: AC
  Administered 2019-03-26: 3000 [IU] via INTRAVENOUS
  Filled 2019-03-26: qty 3000

## 2019-03-26 MED ORDER — SODIUM CHLORIDE 0.9 % IV SOLN
1.0000 g | INTRAVENOUS | Status: DC
  Filled 2019-03-26: qty 10

## 2019-03-26 MED ORDER — HYDROCODONE-ACETAMINOPHEN 5-325 MG PO TABS
1.0000 | ORAL_TABLET | Freq: Two times a day (BID) | ORAL | Status: DC
  Administered 2019-03-26: 2 via ORAL
  Administered 2019-03-27 (×2): 1 via ORAL
  Filled 2019-03-26: qty 1
  Filled 2019-03-26: qty 2
  Filled 2019-03-26: qty 1

## 2019-03-26 MED ORDER — METOPROLOL TARTRATE 25 MG PO TABS
25.0000 mg | ORAL_TABLET | Freq: Two times a day (BID) | ORAL | Status: DC
  Administered 2019-03-26 – 2019-03-27 (×3): 25 mg via ORAL
  Filled 2019-03-26 (×4): qty 1

## 2019-03-26 MED ORDER — MAGNESIUM SULFATE IN D5W 1-5 GM/100ML-% IV SOLN
1.0000 g | Freq: Once | INTRAVENOUS | Status: AC
  Administered 2019-03-26: 1 g via INTRAVENOUS
  Filled 2019-03-26: qty 100

## 2019-03-26 MED ORDER — POTASSIUM CHLORIDE CRYS ER 20 MEQ PO TBCR
40.0000 meq | EXTENDED_RELEASE_TABLET | Freq: Once | ORAL | Status: AC
  Administered 2019-03-26: 40 meq via ORAL
  Filled 2019-03-26: qty 4

## 2019-03-26 NOTE — ED Notes (Signed)
Report to Amy, RN

## 2019-03-26 NOTE — Plan of Care (Signed)
VSS. NSR on tele. Denies any pain or SOB. Pt admitted from ED. Heparin drip infusing per order. Admission navigator complete. Skin assessment performed. Pt oriented to room and instructed to call for any needs. Assessment as documented. IV diuresis continued per order. POC reviewed. Plans for echo 3/6. No new questions/concerns at this time.   Problem: Education: Goal: Knowledge of General Education information will improve Description: Including pain rating scale, medication(s)/side effects and non-pharmacologic comfort measures 03/26/2019 0329 by Jerrel Ivory, RN Outcome: Progressing 03/26/2019 0216 by Jerrel Ivory, RN Outcome: Progressing   Problem: Health Behavior/Discharge Planning: Goal: Ability to manage health-related needs will improve 03/26/2019 0329 by Jerrel Ivory, RN Outcome: Progressing 03/26/2019 0216 by Jerrel Ivory, RN Outcome: Progressing   Problem: Clinical Measurements: Goal: Ability to maintain clinical measurements within normal limits will improve 03/26/2019 0329 by Jerrel Ivory, RN Outcome: Progressing 03/26/2019 0216 by Jerrel Ivory, RN Outcome: Progressing Goal: Will remain free from infection 03/26/2019 0329 by Jerrel Ivory, RN Outcome: Progressing 03/26/2019 0216 by Jerrel Ivory, RN Outcome: Progressing Goal: Diagnostic test results will improve 03/26/2019 0329 by Jerrel Ivory, RN Outcome: Progressing 03/26/2019 0216 by Jerrel Ivory, RN Outcome: Progressing Goal: Respiratory complications will improve 03/26/2019 0329 by Jerrel Ivory, RN Outcome: Progressing 03/26/2019 0216 by Jerrel Ivory, RN Outcome: Progressing Goal: Cardiovascular complication will be avoided 03/26/2019 0329 by Jerrel Ivory, RN Outcome: Progressing 03/26/2019 0216 by Jerrel Ivory, RN Outcome: Progressing   Problem: Activity: Goal: Risk for activity intolerance will decrease 03/26/2019 0329 by Jerrel Ivory, RN Outcome: Progressing 03/26/2019 0216 by Jerrel Ivory,  RN Outcome: Progressing   Problem: Nutrition: Goal: Adequate nutrition will be maintained 03/26/2019 0329 by Jerrel Ivory, RN Outcome: Progressing 03/26/2019 0216 by Jerrel Ivory, RN Outcome: Progressing   Problem: Coping: Goal: Level of anxiety will decrease 03/26/2019 0329 by Jerrel Ivory, RN Outcome: Progressing 03/26/2019 0216 by Jerrel Ivory, RN Outcome: Progressing   Problem: Elimination: Goal: Will not experience complications related to bowel motility 03/26/2019 0329 by Jerrel Ivory, RN Outcome: Progressing 03/26/2019 0216 by Jerrel Ivory, RN Outcome: Progressing Goal: Will not experience complications related to urinary retention 03/26/2019 0329 by Jerrel Ivory, RN Outcome: Progressing 03/26/2019 0216 by Jerrel Ivory, RN Outcome: Progressing   Problem: Pain Managment: Goal: General experience of comfort will improve 03/26/2019 0329 by Jerrel Ivory, RN Outcome: Progressing 03/26/2019 0216 by Jerrel Ivory, RN Outcome: Progressing   Problem: Safety: Goal: Ability to remain free from injury will improve 03/26/2019 0329 by Jerrel Ivory, RN Outcome: Progressing 03/26/2019 0216 by Jerrel Ivory, RN Outcome: Progressing   Problem: Skin Integrity: Goal: Risk for impaired skin integrity will decrease 03/26/2019 0329 by Jerrel Ivory, RN Outcome: Progressing 03/26/2019 0216 by Jerrel Ivory, RN Outcome: Progressing

## 2019-03-26 NOTE — Progress Notes (Signed)
ANTICOAGULATION CONSULT NOTE -  Pharmacy Consult for Heparin Indication: chest pain/ACS  Allergies  Allergen Reactions  . Codeine Nausea And Vomiting  . Morphine And Related Other (See Comments)    Reaction: unknown    Patient Measurements: Height: 5\' 8"  (172.7 cm) Weight: 161 lb 8 oz (73.3 kg) IBW/kg (Calculated) : 68.4 HEPARIN DW (KG): 68.5  Vital Signs: Temp: 98.8 F (37.1 C) (03/06 1922) Temp Source: Oral (03/06 1922) BP: 114/67 (03/06 1922) Pulse Rate: 75 (03/06 1922)  Labs: Recent Labs    03/25/19 2131 03/25/19 2344 03/26/19 0510 03/26/19 1244 03/26/19 2059  HGB 13.7  --  12.9*  --   --   HCT 42.8  --  38.9*  --   --   PLT 185  --  175  --   --   APTT  --  39*  --   --   --   LABPROT  --  14.6  --   --   --   INR  --  1.2  --   --   --   HEPARINUNFRC  --   --  <0.10* 0.20* 0.30  CREATININE 0.97  --  1.00  --   --   TROPONINIHS 781* 787* 629*  --   --     Estimated Creatinine Clearance: 72.2 mL/min (by C-G formula based on SCr of 1 mg/dL).  Medical History: Past Medical History:  Diagnosis Date  . Chronic pain of right hip   . Erectile dysfunction    Medications:  Medications Prior to Admission  Medication Sig Dispense Refill Last Dose  . atorvastatin (LIPITOR) 20 MG tablet Take 1 tablet (20 mg total) by mouth daily. 90 tablet 4 03/25/2019 at Unknown time  . glipiZIDE (GLUCOTROL) 5 MG tablet Take 1 tablet (5 mg total) by mouth daily before breakfast. 90 tablet 3 03/25/2019 at Unknown time  . HYDROcodone-acetaminophen (NORCO/VICODIN) 5-325 MG tablet Twice daily for pain (Patient taking differently: Take 1 tablet by mouth 2 (two) times daily. ) 60 tablet 0 03/25/2019 at Unknown time  . Lancet Devices MISC Use to check blood sugar twice a day 100 each 5   . montelukast (SINGULAIR) 10 MG tablet Take 1 tablet (10 mg total) by mouth at bedtime. (Patient not taking: Reported on 08/27/2018) 30 tablet 5 Not Taking at Unknown time    Assessment: Pharmacy consulted  for Heparin for ACS.  No anticoagulants PTA per current med list.  Baseline labs ordered.   3/6 0510 HL < 0.1  3/6 1244 HL 0.2   Goal of Therapy:  Heparin level 0.3-0.7 units/ml Monitor platelets by anticoagulation protocol: Yes   Plan:  Heparin subtherapeutic. Will give a  1000 unit bolus and increase heparin rate to Will rebolus with 3000 units x 1 then increase infusion to 1300 units/hr, recheck HL ~ 6 hours after rate increased.   3/6: HL @ 2059 = 0.3 Will continue pt on current rate and draw confirmation level in 6 hrs on 3/7 @ 0300.   Ovetta Bazzano D, PharmD 03/26/2019,9:46 PM

## 2019-03-26 NOTE — Consult Note (Signed)
Cardiology Consultation:   Patient ID: Curtis [name] MRN: [id number]; DOB: 03-11-1955  Admit date: 03/25/2019 Date of Consult: 03/26/2019  Primary Care Provider: Birdie Sons, MD Primary Cardiologist: new Primary Electrophysiologist:  None    Patient Profile:   Curtis [name] is a 64 y.o. male with a hx of type 2 diabetes who is being seen today for the evaluation of shortness of breath at the request of Curtis [name].  History of Present Illness:   Mr. Voorhies has a history of type 2 diabetes.  He is wheelchair-bound due to a an old injury but is independent in ADLs.  He presented to the emergency room with shortness of breath with exertion which had been intermittent over the last 2 months.  He has been having increasing lower extremity edema that has been worsening as well as some orthopnea.  He denies chest pain.  His primary physician, over telemetry visit, recommended he go to the emergency room.  In the emergency room, he was found to be hypertensive.  ECG showed T wave inversions in the inferior leads.  Chest x-ray showed cardiomegaly with mild vascular congestion.  Troponin returned 781 with a BNP of 650.  He was started on IV heparin and given aspirin.  Urinalysis in the emergency room found UTI.  The patient states that over the last 2 months, he has been growing more more short of breath.  He has not had any chest pain.  He does not remember any kind of viral illness.  He otherwise has no major complaints other than his chronic pain.  Heart Pathway Score:     Past Medical History:  Diagnosis Date  . Chronic pain of right hip   . Erectile dysfunction     Past Surgical History:  Procedure Laterality Date  . CHOLECYSTECTOMY  05/12/2011  . HAND RECONSTRUCTION    . HEMORRHOID SURGERY  10/31/2011   [facility]     Home Medications:  Prior to Admission medications   Medication Sig Start Date End Date Taking? Authorizing Provider  atorvastatin (LIPITOR) 20 MG tablet Take 1 tablet (20  mg total) by mouth daily. 12/27/18  Yes Birdie Sons, MD  glipiZIDE (GLUCOTROL) 5 MG tablet Take 1 tablet (5 mg total) by mouth daily before breakfast. 12/27/18  Yes Birdie Sons, MD  HYDROcodone-acetaminophen (NORCO/VICODIN) 5-325 MG tablet Twice daily for pain Patient taking differently: Take 1 tablet by mouth 2 (two) times daily.  03/22/19  Yes Birdie Sons, MD  Lancet Devices MISC Use to check blood sugar twice a day 04/26/18   Birdie Sons, MD  montelukast (SINGULAIR) 10 MG tablet Take 1 tablet (10 mg total) by mouth at bedtime. Patient not taking: Reported on 08/27/2018 04/26/18   Birdie Sons, MD    Inpatient Medications: Scheduled Meds: . aspirin EC  81 mg Oral Daily  . atorvastatin  20 mg Oral q1800  . furosemide  60 mg Intravenous Q12H  . insulin aspart  0-15 Units Subcutaneous TID WC  . lisinopril  5 mg Oral Daily  . metoprolol tartrate  25 mg Oral BID   Continuous Infusions: . heparin 1,200 Units/hr (03/26/19 0730)   PRN Meds: acetaminophen, nitroGLYCERIN, ondansetron (ZOFRAN) IV  Allergies:    Allergies  Allergen Reactions  . Codeine Nausea And Vomiting  . Morphine And Related Other (See Comments)    Reaction: unknown    Social History:   Social History   Socioeconomic History  . Marital status: Married  Spouse name: Not on file  . Number of children: Not on file  . Years of education: Not on file  . Highest education level: Not on file  Occupational History  . Not on file  Tobacco Use  . Smoking status: Former Smoker    Packs/day: 0.75    Types: Cigarettes    Quit date: 04/26/2018    Years since quitting: 0.9  . Smokeless tobacco: Never Used  . Tobacco comment: 2-3 ppd since 64yo. cut back to under a ppd since around 2010  Substance and Sexual Activity  . Alcohol use: Yes    Alcohol/week: 0.0 standard drinks    Comment: social  . Drug use: No  . Sexual activity: Not on file  Other Topics Concern  . Not on file  Social History  Narrative  . Not on file   Social Determinants of Health   Financial Resource Strain:   . Difficulty of Paying Living Expenses: Not on file  Food Insecurity:   . Worried About Programme researcher, broadcasting/film/video in the Last Year: Not on file  . Ran Out of Food in the Last Year: Not on file  Transportation Needs:   . Lack of Transportation (Medical): Not on file  . Lack of Transportation (Non-Medical): Not on file  Physical Activity:   . Days of Exercise per Week: Not on file  . Minutes of Exercise per Session: Not on file  Stress:   . Feeling of Stress : Not on file  Social Connections:   . Frequency of Communication with Friends and Family: Not on file  . Frequency of Social Gatherings with Friends and Family: Not on file  . Attends Religious Services: Not on file  . Active Member of Clubs or Organizations: Not on file  . Attends Banker Meetings: Not on file  . Marital Status: Not on file  Intimate Partner Violence:   . Fear of Current or Ex-Partner: Not on file  . Emotionally Abused: Not on file  . Physically Abused: Not on file  . Sexually Abused: Not on file    Family History:    Family History  Family history unknown: Yes     ROS:  Please see the history of present illness.   All other ROS reviewed and negative.     Physical Exam/Data:   Vitals:   03/26/19 0146 03/26/19 0536 03/26/19 0817 03/26/19 1109  BP: (!) 142/79 113/65 (!) 147/94 118/72  Pulse: 92 88 85 85  Resp: 20 20 17 16   Temp: 98.3 F (36.8 C) 98.6 F (37 C) 97.9 F (36.6 C) 98.5 F (36.9 C)  TempSrc: Oral Oral Oral Oral  SpO2: 95% 96% 97% 96%  Weight: 73.3 kg     Height:        Intake/Output Summary (Last 24 hours) at 03/26/2019 1328 Last data filed at 03/26/2019 1129 Gross per 24 hour  Intake 405.25 ml  Output 2175 ml  Net -1769.75 ml   Last 3 Weights 03/26/2019 03/25/2019 08/27/2018  Weight (lbs) 161 lb 8 oz 151 lb 159 lb  Weight (kg) 73.256 kg 68.493 kg 72.122 kg     Body mass index is  24.56 kg/m.  General:  Well nourished, well developed, in no acute distress HEENT: normal Lymph: no adenopathy Neck: no JVD Endocrine:  No thryomegaly Vascular: No carotid bruits; FA pulses 2+ bilaterally without bruits  Cardiac:  normal S1, S2; RRR; no murmur, 2+ lower extremity edema Lungs:  clear to auscultation bilaterally,  no wheezing, rhonchi or rales  Abd: soft, nontender, no hepatomegaly  Ext: no edema Musculoskeletal:  No deformities Skin: warm and dry  Neuro:  CNs 2-12 intact, no focal abnormalities noted Psych:  Normal affect   EKG:  The EKG was personally reviewed and demonstrates: This rhythm, inferior lateral T wave inversions, right axis deviation Telemetry:  Telemetry was personally reviewed and demonstrates: Sinus rhythm  Relevant CV Studies: TTE pending  Laboratory Data:  High Sensitivity Troponin:   Recent Labs  Lab 03/25/19 2131 03/25/19 2344 03/26/19 0510  TROPONINIHS 781* 787* 629*     Chemistry Recent Labs  Lab 03/25/19 2131 03/26/19 0510  NA 131* 135  K 3.2* 3.2*  CL 94* 98  CO2 27 30  GLUCOSE 183* 192*  BUN 19 18  CREATININE 0.97 1.00  CALCIUM 9.3 8.1*  GFRNONAA >60 >60  GFRAA >60 >60  ANIONGAP 10 7    Recent Labs  Lab 03/25/19 2131  PROT 8.3*  ALBUMIN 3.6  AST 18  ALT 13  ALKPHOS 78  BILITOT 0.9   Hematology Recent Labs  Lab 03/25/19 2131 03/26/19 0510  WBC 8.7 8.4  RBC 4.94 4.54  HGB 13.7 12.9*  HCT 42.8 38.9*  MCV 86.6 85.7  MCH 27.7 28.4  MCHC 32.0 33.2  RDW 13.9 13.3  PLT 185 175   BNP Recent Labs  Lab 03/25/19 2132  BNP 650.0*    DDimer No results for input(s): DDIMER in the last 168 hours.   Radiology/Studies:  DG Chest 1 View  Result Date: 03/25/2019 CLINICAL DATA:  64 year old male with shortness of breath. EXAM: CHEST  1 VIEW COMPARISON:  Chest radiograph dated 04/25/2008. FINDINGS: Mild cardiomegaly with probable mild vascular congestion. There is background of emphysema. No focal consolidation,  pleural effusion, or pneumothorax. Atherosclerotic calcification of the aortic arch. No acute osseous pathology. IMPRESSION: Cardiomegaly with probable mild vascular congestion. Electronically Signed   By: Elgie Collard M.D.   On: 03/25/2019 21:59    Assessment and Plan:   1. CHF, acute systolic: My review of his echo during the study showed severe reduction in his systolic function.  BNP is 650 with vascular congestion on chest x-ray.  He does also have quite a bit of lower extremity edema.  We Erline Siddoway continue with diuresis.  He is net out 1.7 L since admission.  Creatinine has remained stable. 2. Elevated troponin: Patient had shortness of breath, though no chest pain.  I suspect that this is due to his systolic heart failure and volume overload, though non-STEMI cannot be ruled out.  He Demetrius Barrell need left heart catheterization early this week.  Would continue heparin at this time. 3. UTI: No symptoms of dysuria.  Rocephin per primary team.      For questions or updates, please contact [facility] Please consult [email or web address] for contact info under     Signed, Demarquis Osley Jorja Loa, MD  03/26/2019 1:28 PM

## 2019-03-26 NOTE — ED Notes (Addendum)
Repeat EKG done, exported and notified admitting MD who ordered. Pt given snack per request.

## 2019-03-26 NOTE — Progress Notes (Signed)
*  PRELIMINARY RESULTS* Echocardiogram 2D Echocardiogram has been performed.  Jourdin Gens S Gyanna Jarema 03/26/2019, 3:12 PM 

## 2019-03-26 NOTE — Progress Notes (Signed)
PROGRESS NOTE                                                                                                                                                                                                             Patient Demographics:    Curtis [name], is a 64 y.o. male, DOB - May 20, 1955, ZOX:[id number]  Admit date - 03/25/2019   Admitting Physician Andris Baumann, MD  Outpatient Primary MD for the patient is Fisher, Demetrios Isaacs, MD  LOS - 1  Outpatient Specialists: None  Chief Complaint  Patient presents with  . Shortness of Breath       Brief Narrative 64 year old male with type 2 diabetes mellitus, wheelchair-bound due to leg injury presented to the ED with 2 months of increasing shortness of breath with recent lower extremity edema that has been progressive.  Also reported having double substernal chest pressure off and on which was nonradiating. In the ED he had elevated blood pressure 159/80 mmHg, EKG showing T wave inversion in inferior leads and chest x-ray showed cardiomegaly with pulmonary vascular congestion.  Patient had elevated troponin of 781 and BNP of 650.  Patient given IV Lasix, aspirin and started on IV heparin for NSTEMI.  Cardiology consulted.   Subjective:   Denies any chest pressure.  Shortness of breath minimally improved.  Still having significant leg swellings.   Assessment  & Plan :    Principal Problem:   Acute CHF, systolic versus diastolic not specified (congestive heart failure) (HCC) Continue telemetry monitoring.  IV Lasix 60 mg every 12 hours.  Strict I's/O, fluid restriction and daily weight.  Follow 2D echo.  Daily electrolytes.  Continue aspirin and statin.    NSTEMI (non-ST elevated myocardial infarction) (HCC) Troponin peaked at 787.  No further chest pressure.  Continue aspirin and IV heparin.  Added statin.  Add metoprolol.  Continue lisinopril.  Follow 2D echo. Patient denies  any history of heart disease or cardiac stress test done.  Cardiology to see and further recommend.  Active Problems:   Type 2 diabetes mellitus with complication, without long-term current use of insulin (HCC) CBG stable.  A1c of 7.7.  Monitor on sliding scale coverage.  Hypokalemia Replenish   ?  UTI (urinary tract infection) UA shows WBC and positive nitrite.  Denies any urinary frequency or dysuria.  Will  monitor off antibiotic.     Code Status : Full code  Family Communication  : None  Disposition Plan  : Home pending improvement in his CHF and further work-up for his NSTEMI.  Possibly in the next 72 hours  Barriers For Discharge : Acute CHF and NSTEMI  Consults  : Cardiology  Procedures  : 2D echo  DVT Prophylaxis  : IV heparin  Lab Results  Component Value Date   PLT 175 03/26/2019    Antibiotics  :    Anti-infectives (From admission, onward)   Start     Dose/Rate Route Frequency Ordered Stop   03/26/19 1800  cefTRIAXone (ROCEPHIN) 1 g in sodium chloride 0.9 % 100 mL IVPB  Status:  Discontinued     1 g 200 mL/hr over 30 Minutes Intravenous Every 24 hours 03/26/19 0811 03/26/19 0925   03/25/19 2345  cefTRIAXone (ROCEPHIN) 1 g in sodium chloride 0.9 % 100 mL IVPB     1 g 200 mL/hr over 30 Minutes Intravenous  Once 03/25/19 2315 03/26/19 0032        Objective:   Vitals:   03/26/19 0146 03/26/19 0536 03/26/19 0817 03/26/19 1109  BP: (!) 142/79 113/65 (!) 147/94 118/72  Pulse: 92 88 85 85  Resp: 20 20 17 16   Temp: 98.3 F (36.8 C) 98.6 F (37 C) 97.9 F (36.6 C) 98.5 F (36.9 C)  TempSrc: Oral Oral Oral Oral  SpO2: 95% 96% 97% 96%  Weight: 73.3 kg     Height:        Wt Readings from Last 3 Encounters:  03/26/19 73.3 kg  08/27/18 72.1 kg  03/19/18 64.4 kg     Intake/Output Summary (Last 24 hours) at 03/26/2019 1314 Last data filed at 03/26/2019 1129 Gross per 24 hour  Intake 405.25 ml  Output 2175 ml  Net -1769.75 ml     Physical  Exam  Gen: not in distress HEENT: no pallor, moist mucosa, supple neck, no JVD Chest: Fine bibasilar crackles CVS: N S1&S2, no murmurs, rubs or gallop GI: soft, NT, ND, BS+ Musculoskeletal: warm, 2+ pitting edema bilaterally up to the knees     Data Review:    CBC Recent Labs  Lab 03/25/19 2131 03/26/19 0510  WBC 8.7 8.4  HGB 13.7 12.9*  HCT 42.8 38.9*  PLT 185 175  MCV 86.6 85.7  MCH 27.7 28.4  MCHC 32.0 33.2  RDW 13.9 13.3  LYMPHSABS 1.3  --   MONOABS 1.2*  --   EOSABS 0.2  --   BASOSABS 0.0  --     Chemistries  Recent Labs  Lab 03/25/19 2131 03/26/19 0510  NA 131* 135  K 3.2* 3.2*  CL 94* 98  CO2 27 30  GLUCOSE 183* 192*  BUN 19 18  CREATININE 0.97 1.00  CALCIUM 9.3 8.1*  MG  --  1.8  AST 18  --   ALT 13  --   ALKPHOS 78  --   BILITOT 0.9  --    ------------------------------------------------------------------------------------------------------------------ Recent Labs    03/26/19 0510  CHOL 149  HDL 21*  LDLCALC 69  TRIG 05/26/19*  CHOLHDL 7.1    Lab Results  Component Value Date   HGBA1C 7.7 (H) 03/25/2019   ------------------------------------------------------------------------------------------------------------------ No results for input(s): TSH, T4TOTAL, T3FREE, THYROIDAB in the last 72 hours.  Invalid input(s): FREET3 ------------------------------------------------------------------------------------------------------------------ No results for input(s): VITAMINB12, FOLATE, FERRITIN, TIBC, IRON, RETICCTPCT in the last 72 hours.  Coagulation profile Recent Labs  Lab  03/25/19 2344  INR 1.2    No results for input(s): DDIMER in the last 72 hours.  Cardiac Enzymes No results for input(s): CKMB, TROPONINI, MYOGLOBIN in the last 168 hours.  Invalid input(s): CK ------------------------------------------------------------------------------------------------------------------    Component Value Date/Time   BNP 650.0 (H)  03/25/2019 2132    Inpatient Medications  Scheduled Meds: . aspirin EC  81 mg Oral Daily  . atorvastatin  20 mg Oral q1800  . furosemide  60 mg Intravenous Q12H  . insulin aspart  0-15 Units Subcutaneous TID WC  . lisinopril  5 mg Oral Daily   Continuous Infusions: . heparin 1,200 Units/hr (03/26/19 0730)   PRN Meds:.acetaminophen, nitroGLYCERIN, ondansetron (ZOFRAN) IV  Micro Results Recent Results (from the past 240 hour(s))  Respiratory Panel by RT PCR (Flu A&B, Covid) - Nasopharyngeal Swab     Status: None   Collection Time: 03/25/19 10:53 PM   Specimen: Nasopharyngeal Swab  Result Value Ref Range Status   SARS Coronavirus 2 by RT PCR NEGATIVE NEGATIVE Final    Comment: (NOTE) SARS-CoV-2 target nucleic acids are NOT DETECTED. The SARS-CoV-2 RNA is generally detectable in upper respiratoy specimens during the acute phase of infection. The lowest concentration of SARS-CoV-2 viral copies this assay can detect is 131 copies/mL. A negative result does not preclude SARS-Cov-2 infection and should not be used as the sole basis for treatment or other patient management decisions. A negative result may occur with  improper specimen collection/handling, submission of specimen other than nasopharyngeal swab, presence of viral mutation(s) within the areas targeted by this assay, and inadequate number of viral copies (<131 copies/mL). A negative result must be combined with clinical observations, patient history, and epidemiological information. The expected result is Negative. Fact Sheet for Patients:  PinkCheek.be Fact Sheet for Healthcare Providers:  GravelBags.it This test is not yet ap proved or cleared by the [place] FDA and  has been authorized for detection and/or diagnosis of SARS-CoV-2 by FDA under an Emergency Use Authorization (EUA). This EUA will remain  in effect (meaning this test can be used) for the  duration of the COVID-19 declaration under Section 564(b)(1) of the Act, 21 U.S.C. section 360bbb-3(b)(1), unless the authorization is terminated or revoked sooner.    Influenza A by PCR NEGATIVE NEGATIVE Final   Influenza B by PCR NEGATIVE NEGATIVE Final    Comment: (NOTE) The Xpert Xpress SARS-CoV-2/FLU/RSV assay is intended as an aid in  the diagnosis of influenza from Nasopharyngeal swab specimens and  should not be used as a sole basis for treatment. Nasal washings and  aspirates are unacceptable for Xpert Xpress SARS-CoV-2/FLU/RSV  testing. Fact Sheet for Patients: PinkCheek.be Fact Sheet for Healthcare Providers: GravelBags.it This test is not yet approved or cleared by the [place] FDA and  has been authorized for detection and/or diagnosis of SARS-CoV-2 by  FDA under an Emergency Use Authorization (EUA). This EUA will remain  in effect (meaning this test can be used) for the duration of the  Covid-19 declaration under Section 564(b)(1) of the Act, 21  U.S.C. section 360bbb-3(b)(1), unless the authorization is  terminated or revoked. Performed at [facility], [address], [place], [place] [address]     Radiology Reports DG Chest 1 View  Result Date: 03/25/2019 CLINICAL DATA:  64 year old male with shortness of breath. EXAM: CHEST  1 VIEW COMPARISON:  Chest radiograph dated 04/25/2008. FINDINGS: Mild cardiomegaly with probable mild vascular congestion. There is background of emphysema. No focal consolidation, pleural effusion,  or pneumothorax. Atherosclerotic calcification of the aortic arch. No acute osseous pathology. IMPRESSION: Cardiomegaly with probable mild vascular congestion. Electronically Signed   By: Elgie Collard M.D.   On: 03/25/2019 21:59    Time Spent in minutes 35   Aryana Wonnacott M.D on 03/26/2019 at 1:14 PM  Between 7am to 7pm - Pager - [phone]  After 7pm go to  [email or web address] - password [facility]  [facility] -  Office  [phone]

## 2019-03-26 NOTE — Progress Notes (Signed)
ANTICOAGULATION CONSULT NOTE -  Pharmacy Consult for Heparin Indication: chest pain/ACS  Allergies  Allergen Reactions  . Codeine Nausea And Vomiting  . Morphine And Related Other (See Comments)    Reaction: unknown    Patient Measurements: Height: 5\' 8"  (172.7 cm) Weight: 161 lb 8 oz (73.3 kg) IBW/kg (Calculated) : 68.4 HEPARIN DW (KG): 68.5  Vital Signs: Temp: 98.5 F (36.9 C) (03/06 1109) Temp Source: Oral (03/06 1109) BP: 118/72 (03/06 1109) Pulse Rate: 85 (03/06 1109)  Labs: Recent Labs    03/25/19 2131 03/25/19 2344 03/26/19 0510 03/26/19 1244  HGB 13.7  --  12.9*  --   HCT 42.8  --  38.9*  --   PLT 185  --  175  --   APTT  --  39*  --   --   LABPROT  --  14.6  --   --   INR  --  1.2  --   --   HEPARINUNFRC  --   --  <0.10* 0.20*  CREATININE 0.97  --  1.00  --   TROPONINIHS 781* 787* 629*  --     Estimated Creatinine Clearance: 72.2 mL/min (by C-G formula based on SCr of 1 mg/dL).  Medical History: Past Medical History:  Diagnosis Date  . Chronic pain of right hip   . Erectile dysfunction    Medications:  Medications Prior to Admission  Medication Sig Dispense Refill Last Dose  . atorvastatin (LIPITOR) 20 MG tablet Take 1 tablet (20 mg total) by mouth daily. 90 tablet 4 03/25/2019 at Unknown time  . glipiZIDE (GLUCOTROL) 5 MG tablet Take 1 tablet (5 mg total) by mouth daily before breakfast. 90 tablet 3 03/25/2019 at Unknown time  . HYDROcodone-acetaminophen (NORCO/VICODIN) 5-325 MG tablet Twice daily for pain (Patient taking differently: Take 1 tablet by mouth 2 (two) times daily. ) 60 tablet 0 03/25/2019 at Unknown time  . Lancet Devices MISC Use to check blood sugar twice a day 100 each 5   . montelukast (SINGULAIR) 10 MG tablet Take 1 tablet (10 mg total) by mouth at bedtime. (Patient not taking: Reported on 08/27/2018) 30 tablet 5 Not Taking at Unknown time    Assessment: Pharmacy consulted for Heparin for ACS.  No anticoagulants PTA per current med  list.  Baseline labs ordered.   3/6 0510 HL < 0.1  3/6 1244 HL 0.2   Goal of Therapy:  Heparin level 0.3-0.7 units/ml Monitor platelets by anticoagulation protocol: Yes   Plan:  Heparin subtherapeutic. Will give a  1000 unit bolus and increase heparin rate to Will rebolus with 3000 units x 1 then increase infusion to 1300 units/hr, recheck HL ~ 6 hours after rate increased.   10/27/2018, PharmD, BCPS 03/26/2019,2:16 PM

## 2019-03-26 NOTE — Progress Notes (Signed)
ANTICOAGULATION CONSULT NOTE -  Pharmacy Consult for Heparin Indication: chest pain/ACS  Allergies  Allergen Reactions  . Codeine Nausea And Vomiting  . Morphine And Related Other (See Comments)    Reaction: unknown    Patient Measurements: Height: 5\' 8"  (172.7 cm) Weight: 161 lb 8 oz (73.3 kg) IBW/kg (Calculated) : 68.4 HEPARIN DW (KG): 68.5  Vital Signs: Temp: 98.6 F (37 C) (03/06 0536) Temp Source: Oral (03/06 0536) BP: 113/65 (03/06 0536) Pulse Rate: 88 (03/06 0536)  Labs: Recent Labs    03/25/19 2131 03/25/19 2344 03/26/19 0510  HGB 13.7  --  12.9*  HCT 42.8  --  38.9*  PLT 185  --  175  APTT  --  39*  --   LABPROT  --  14.6  --   INR  --  1.2  --   HEPARINUNFRC  --   --  <0.10*  CREATININE 0.97  --  1.00  TROPONINIHS 781* 787* 629*    Estimated Creatinine Clearance: 72.2 mL/min (by C-G formula based on SCr of 1 mg/dL).  Medical History: Past Medical History:  Diagnosis Date  . Chronic pain of right hip   . Erectile dysfunction    Medications:  Medications Prior to Admission  Medication Sig Dispense Refill Last Dose  . atorvastatin (LIPITOR) 20 MG tablet Take 1 tablet (20 mg total) by mouth daily. 90 tablet 4 03/25/2019 at Unknown time  . glipiZIDE (GLUCOTROL) 5 MG tablet Take 1 tablet (5 mg total) by mouth daily before breakfast. 90 tablet 3 03/25/2019 at Unknown time  . HYDROcodone-acetaminophen (NORCO/VICODIN) 5-325 MG tablet Twice daily for pain (Patient taking differently: Take 1 tablet by mouth 2 (two) times daily. ) 60 tablet 0 03/25/2019 at Unknown time  . Lancet Devices MISC Use to check blood sugar twice a day 100 each 5   . montelukast (SINGULAIR) 10 MG tablet Take 1 tablet (10 mg total) by mouth at bedtime. (Patient not taking: Reported on 08/27/2018) 30 tablet 5 Not Taking at Unknown time    Assessment: Pharmacy consulted for Heparin for ACS.  No anticoagulants PTA per current med list.  Baseline labs ordered.   Goal of Therapy:  Heparin  level 0.3-0.7 units/ml Monitor platelets by anticoagulation protocol: Yes   Plan:  Heparin 4000 units bolus x 1 then infusion at 950 units/hr Check HL ~ 6 hours after heparin started  0306 0510 HL < 0.10, subtherapeutic. Will rebolus with 3000 units x 1 then increase infusion to 1200 units/hr, recheck HL ~ 6 hours after rate increased.   10/27/2018, Dia Donate A 03/26/2019,6:52 AM

## 2019-03-27 DIAGNOSIS — I255 Ischemic cardiomyopathy: Secondary | ICD-10-CM

## 2019-03-27 LAB — BASIC METABOLIC PANEL
Anion gap: 7 (ref 5–15)
BUN: 24 mg/dL — ABNORMAL HIGH (ref 8–23)
CO2: 33 mmol/L — ABNORMAL HIGH (ref 22–32)
Calcium: 8.5 mg/dL — ABNORMAL LOW (ref 8.9–10.3)
Chloride: 97 mmol/L — ABNORMAL LOW (ref 98–111)
Creatinine, Ser: 1.26 mg/dL — ABNORMAL HIGH (ref 0.61–1.24)
GFR calc Af Amer: >60 mL/min (ref >60)
GFR calc non Af Amer: 60 mL/min — ABNORMAL LOW (ref >60)
Glucose, Bld: 183 mg/dL — ABNORMAL HIGH (ref 70–99)
Potassium: 3.7 mmol/L (ref 3.5–5.1)
Sodium: 137 mmol/L (ref 135–145)

## 2019-03-27 LAB — CBC
HCT: 38.2 % — ABNORMAL LOW (ref 39.0–52.0)
Hemoglobin: 12.4 g/dL — ABNORMAL LOW (ref 13.0–17.0)
MCH: 28.2 pg (ref 26.0–34.0)
MCHC: 32.5 g/dL (ref 30.0–36.0)
MCV: 86.8 fL (ref 80.0–100.0)
Platelets: 171 10*3/uL (ref 150–400)
RBC: 4.4 MIL/uL (ref 4.22–5.81)
RDW: 13.7 % (ref 11.5–15.5)
WBC: 7.7 10*3/uL (ref 4.0–10.5)
nRBC: 0 % (ref 0.0–0.2)

## 2019-03-27 LAB — HEPARIN LEVEL (UNFRACTIONATED)
Heparin Unfractionated: 0.2 IU/mL — ABNORMAL LOW (ref 0.30–0.70)
Heparin Unfractionated: 0.24 IU/mL — ABNORMAL LOW (ref 0.30–0.70)
Heparin Unfractionated: 0.55 IU/mL (ref 0.30–0.70)

## 2019-03-27 LAB — GLUCOSE, CAPILLARY
Glucose-Capillary: 153 mg/dL — ABNORMAL HIGH (ref 70–99)
Glucose-Capillary: 179 mg/dL — ABNORMAL HIGH (ref 70–99)
Glucose-Capillary: 127 mg/dL — ABNORMAL HIGH (ref 70–99)
Glucose-Capillary: 151 mg/dL — ABNORMAL HIGH (ref 70–99)

## 2019-03-27 MED ORDER — HEPARIN BOLUS VIA INFUSION
1000.0000 [IU] | Freq: Once | INTRAVENOUS | Status: AC
  Administered 2019-03-27: 1000 [IU] via INTRAVENOUS
  Filled 2019-03-27: qty 1000

## 2019-03-27 MED ORDER — FUROSEMIDE 10 MG/ML IJ SOLN
60.0000 mg | Freq: Two times a day (BID) | INTRAMUSCULAR | Status: DC
  Administered 2019-03-27: 60 mg via INTRAVENOUS
  Filled 2019-03-27: qty 6

## 2019-03-27 NOTE — Progress Notes (Signed)
ANTICOAGULATION CONSULT NOTE -  Pharmacy Consult for Heparin Indication: chest pain/ACS  Allergies  Allergen Reactions  . Codeine Nausea And Vomiting  . Morphine And Related Other (See Comments)    Reaction: unknown    Patient Measurements: Height: 5\' 8"  (172.7 cm) Weight: 161 lb 8 oz (73.3 kg) IBW/kg (Calculated) : 68.4 HEPARIN DW (KG): 68.5  Vital Signs: Temp: 98.8 F (37.1 C) (03/06 1922) Temp Source: Oral (03/06 1922) BP: 114/67 (03/06 1922) Pulse Rate: 75 (03/06 1922)  Labs: Recent Labs    03/25/19 2131 03/25/19 2131 03/25/19 2344 03/26/19 0510 03/26/19 0510 03/26/19 1244 03/26/19 2059 03/27/19 0307  HGB 13.7   < >  --  12.9*  --   --   --  12.4*  HCT 42.8  --   --  38.9*  --   --   --  38.2*  PLT 185  --   --  175  --   --   --  171  APTT  --   --  39*  --   --   --   --   --   LABPROT  --   --  14.6  --   --   --   --   --   INR  --   --  1.2  --   --   --   --   --   HEPARINUNFRC  --   --   --  <0.10*   < > 0.20* 0.30 0.20*  CREATININE 0.97  --   --  1.00  --   --   --  1.26*  TROPONINIHS 781*  --  787* 629*  --   --   --   --    < > = values in this interval not displayed.    Estimated Creatinine Clearance: 57.3 mL/min (A) (by C-G formula based on SCr of 1.26 mg/dL (H)).  Medical History: Past Medical History:  Diagnosis Date  . Chronic pain of right hip   . Erectile dysfunction    Medications:  Medications Prior to Admission  Medication Sig Dispense Refill Last Dose  . atorvastatin (LIPITOR) 20 MG tablet Take 1 tablet (20 mg total) by mouth daily. 90 tablet 4 03/25/2019 at Unknown time  . glipiZIDE (GLUCOTROL) 5 MG tablet Take 1 tablet (5 mg total) by mouth daily before breakfast. 90 tablet 3 03/25/2019 at Unknown time  . HYDROcodone-acetaminophen (NORCO/VICODIN) 5-325 MG tablet Twice daily for pain (Patient taking differently: Take 1 tablet by mouth 2 (two) times daily. ) 60 tablet 0 03/25/2019 at Unknown time  . Lancet Devices MISC Use to check  blood sugar twice a day 100 each 5   . montelukast (SINGULAIR) 10 MG tablet Take 1 tablet (10 mg total) by mouth at bedtime. (Patient not taking: Reported on 08/27/2018) 30 tablet 5 Not Taking at Unknown time    Assessment: Pharmacy consulted for Heparin for ACS.  No anticoagulants PTA per current med list.  Baseline labs ordered.   3/6 0510 HL < 0.1  3/6 1244 HL 0.2   Goal of Therapy:  Heparin level 0.3-0.7 units/ml Monitor platelets by anticoagulation protocol: Yes   Plan:  3/6: HL @ 2059 = 0.3 Will continue pt on current rate and draw confirmation level in 6 hrs on 3/7 @ 0300.   3/7 0307 HL 0.20, subtherapeutic, CBC ok.  Will give 1000 unit rebolus and increase infusion to 1500 units/hr.  Recheck HL ~ 6 hours after  rate increase.  Ena Dawley, PharmD 03/27/2019,4:34 AM

## 2019-03-27 NOTE — Progress Notes (Signed)
Progress Note  Patient Name: Curtis [name] Date of Encounter: 03/27/2019  Primary Cardiologist: New  Subjective   He has felt much better since diuresis.  He is net out 1.3 L.  He was able to lie flat mostly overnight, but did get short of breath towards the end of the night.  Inpatient Medications    Scheduled Meds: . aspirin EC  81 mg Oral Daily  . atorvastatin  20 mg Oral q1800  . furosemide  60 mg Intravenous Q12H  . HYDROcodone-acetaminophen  1-2 tablet Oral BID  . insulin aspart  0-15 Units Subcutaneous TID WC  . lisinopril  5 mg Oral Daily  . metoprolol tartrate  25 mg Oral BID   Continuous Infusions: . heparin 1,650 Units/hr (03/27/19 1155)   PRN Meds: acetaminophen, nitroGLYCERIN, ondansetron (ZOFRAN) IV   Vital Signs    Vitals:   03/27/19 0615 03/27/19 0816 03/27/19 1019 03/27/19 1225  BP: 122/77 120/72 123/69 105/68  Pulse: 70 71 68 69  Resp: 18 18  16   Temp: 97.8 F (36.6 C) 98.3 F (36.8 C)  98.2 F (36.8 C)  TempSrc: Oral     SpO2: 97% 99%  97%  Weight: 73.2 kg     Height:        Intake/Output Summary (Last 24 hours) at 03/27/2019 1334 Last data filed at 03/27/2019 05/27/2019 Gross per 24 hour  Intake 1060 ml  Output 665 ml  Net 395 ml   Last 3 Weights 03/27/2019 03/26/2019 03/25/2019  Weight (lbs) 161 lb 6.4 oz 161 lb 8 oz 151 lb  Weight (kg) 73.211 kg 73.256 kg 68.493 kg      Telemetry    Sinus rhythm- Personally Reviewed  ECG    None new- Personally Reviewed  Physical Exam   GEN: No acute distress.   Neck: No JVD Cardiac: RRR, no murmurs, rubs, or gallops.  Respiratory:  Crackles at the bases GI: Soft, nontender, non-distended  MS:  2+ edema; No deformity. Neuro:  Nonfocal  Psych: Normal affect   Labs    High Sensitivity Troponin:   Recent Labs  Lab 03/25/19 2131 03/25/19 2344 03/26/19 0510  TROPONINIHS 781* 787* 629*      Chemistry Recent Labs  Lab 03/25/19 2131 03/26/19 0510 03/27/19 0307  NA 131* 135 137  K 3.2* 3.2*  3.7  CL 94* 98 97*  CO2 27 30 33*  GLUCOSE 183* 192* 183*  BUN 19 18 24*  CREATININE 0.97 1.00 1.26*  CALCIUM 9.3 8.1* 8.5*  PROT 8.3*  --   --   ALBUMIN 3.6  --   --   AST 18  --   --   ALT 13  --   --   ALKPHOS 78  --   --   BILITOT 0.9  --   --   GFRNONAA >60 >60 60*  GFRAA >60 >60 >60  ANIONGAP 10 7 7      Hematology Recent Labs  Lab 03/25/19 2131 03/26/19 0510 03/27/19 0307  WBC 8.7 8.4 7.7  RBC 4.94 4.54 4.40  HGB 13.7 12.9* 12.4*  HCT 42.8 38.9* 38.2*  MCV 86.6 85.7 86.8  MCH 27.7 28.4 28.2  MCHC 32.0 33.2 32.5  RDW 13.9 13.3 13.7  PLT 185 175 171    BNP Recent Labs  Lab 03/25/19 2132  BNP 650.0*     DDimer No results for input(s): DDIMER in the last 168 hours.   Radiology    DG Chest 1 View  Result Date: 03/25/2019 CLINICAL DATA:  64 year old male with shortness of breath. EXAM: CHEST  1 VIEW COMPARISON:  Chest radiograph dated 04/25/2008. FINDINGS: Mild cardiomegaly with probable mild vascular congestion. There is background of emphysema. No focal consolidation, pleural effusion, or pneumothorax. Atherosclerotic calcification of the aortic arch. No acute osseous pathology. IMPRESSION: Cardiomegaly with probable mild vascular congestion. Electronically Signed   By: Anner Crete M.D.   On: 03/25/2019 21:59   ECHOCARDIOGRAM COMPLETE  Result Date: 03/26/2019    ECHOCARDIOGRAM REPORT   Patient Name:   Curtis [name] Curtis [name] Date of Exam: 03/26/2019 Medical Rec #:  [id number]      Height:       68.0 in Accession #:    [id number]     Weight:       161.5 lb Date of Birth:  1955-09-07      BSA:          1.866 m Patient Age:    56 years       BP:           118/72 mmHg Patient Gender: M              HR:           86 bpm. Exam Location:  [facility] Procedure: 2D Echo Indications:     CHF 428.31/ I50.31  History:         Patient has no prior history of Echocardiogram examinations.  Sonographer:     Arville Go RDCS Referring Phys:  [id number] Athena Masse Diagnosing Phys:  Nelva Bush MD IMPRESSIONS  1. Left ventricular ejection fraction, by estimation, is 25 to 30%. The left ventricle has severely decreased function. The left ventricle demonstrates global hypokinesis. The left ventricular internal cavity size was mildly dilated. There is mild left ventricular hypertrophy. Left ventricular diastolic parameters are consistent with Grade II diastolic dysfunction (pseudonormalization).  2. Right ventricular systolic function is normal. The right ventricular size is normal. mildly increased right ventricular wall thickness. Tricuspid regurgitation signal is inadequate for assessing PA pressure.  3. Left atrial size was mildly dilated.  4. The mitral valve is degenerative. Mild mitral valve regurgitation. No evidence of mitral stenosis.  5. The aortic valve has an indeterminant number of cusps. Aortic valve regurgitation is trivial. Mild aortic valve sclerosis is present, with no evidence of aortic valve stenosis.  6. The inferior vena cava is normal in size with <50% respiratory variability, suggesting right atrial pressure of 8 mmHg. FINDINGS  Left Ventricle: Left ventricular ejection fraction, by estimation, is 25 to 30%. The left ventricle has severely decreased function. The left ventricle demonstrates global hypokinesis. The left ventricular internal cavity size was mildly dilated. There is mild left ventricular hypertrophy. Left ventricular diastolic parameters are consistent with Grade II diastolic dysfunction (pseudonormalization). Right Ventricle: The right ventricular size is normal. Mildly increased right ventricular wall thickness. Right ventricular systolic function is normal. Tricuspid regurgitation signal is inadequate for assessing PA pressure. Left Atrium: Left atrial size was mildly dilated. Right Atrium: Right atrial size was normal in size. Pericardium: There is no evidence of pericardial effusion. Mitral Valve: The mitral valve is degenerative in appearance. There  is mild thickening of the mitral valve leaflet(s). Mild mitral valve regurgitation. No evidence of mitral valve stenosis. Tricuspid Valve: The tricuspid valve is not well visualized. Tricuspid valve regurgitation is trivial. Aortic Valve: The aortic valve has an indeterminant number of cusps. . There is mild thickening of the aortic valve. Aortic valve regurgitation  is trivial. Mild aortic valve sclerosis is present, with no evidence of aortic valve stenosis. There is mild thickening of the aortic valve. Aortic valve peak gradient measures 4.0 mmHg. Pulmonic Valve: The pulmonic valve was not well visualized. Pulmonic valve regurgitation is mild. No evidence of pulmonic stenosis. Aorta: The aortic root and ascending aorta are structurally normal, with no evidence of dilitation. Pulmonary Artery: The pulmonary artery is not well seen. Venous: The inferior vena cava is normal in size with less than 50% respiratory variability, suggesting right atrial pressure of 8 mmHg. IAS/Shunts: No atrial level shunt detected by color flow Doppler.  LEFT VENTRICLE PLAX 2D LVIDd:         5.87 cm      Diastology LVIDs:         4.99 cm      LV e' lateral:   7.07 cm/s LV PW:         0.96 cm      LV E/e' lateral: 9.3 LV IVS:        1.23 cm      LV e' medial:    4.79 cm/s LVOT diam:     2.20 cm      LV E/e' medial:  13.8 LV SV:         36 LV SV Index:   20 LVOT Area:     3.80 cm  LV Volumes (MOD) LV vol d, MOD A2C: 133.0 ml LV vol d, MOD A4C: 118.0 ml LV vol s, MOD A2C: 103.0 ml LV vol s, MOD A4C: 85.0 ml LV SV MOD A2C:     30.0 ml LV SV MOD A4C:     118.0 ml LV SV MOD BP:      33.2 ml RIGHT VENTRICLE RV Basal diam:  2.77 cm RV S prime:     15.90 cm/s TAPSE (M-mode): 2.0 cm LEFT ATRIUM           Index       RIGHT ATRIUM           Index LA diam:      3.50 cm 1.88 cm/m  RA Area:     16.70 cm LA Vol (A2C): 73.3 ml 39.28 ml/m RA Volume:   48.60 ml  26.04 ml/m LA Vol (A4C): 35.1 ml 18.81 ml/m  AORTIC VALVE                PULMONIC VALVE  AV Area (Vmax): 2.22 cm    PV Vmax:        0.68 m/s AV Vmax:        100.00 cm/s PV Peak grad:   1.8 mmHg AV Peak Grad:   4.0 mmHg    RVOT Peak grad: 1 mmHg LVOT Vmax:      58.40 cm/s LVOT Vmean:     39.200 cm/s LVOT VTI:       0.096 m  AORTA Ao Root diam: 3.50 cm Ao Asc diam:  3.00 cm MITRAL VALVE MV Area (PHT): 5.13 cm    SHUNTS MV Decel Time: 148 msec    Systemic VTI:  0.10 m MV E velocity: 66.00 cm/s  Systemic Diam: 2.20 cm MV A velocity: 52.90 cm/s MV E/A ratio:  1.25 Yvonne Kendall MD Electronically signed by Yvonne Kendall MD Signature Date/Time: 03/26/2019/4:04:55 PM    Final     Cardiac Studies   TTE 03/26/19 1. Left ventricular ejection fraction, by estimation, is 25 to 30%. The  left ventricle has severely decreased function.  The left ventricle  demonstrates global hypokinesis. The left ventricular internal cavity size  was mildly dilated. There is mild left  ventricular hypertrophy. Left ventricular diastolic parameters are  consistent with Grade II diastolic dysfunction (pseudonormalization).  2. Right ventricular systolic function is normal. The right ventricular  size is normal. mildly increased right ventricular wall thickness.  Tricuspid regurgitation signal is inadequate for assessing PA pressure.  3. Left atrial size was mildly dilated.  4. The mitral valve is degenerative. Mild mitral valve regurgitation. No  evidence of mitral stenosis.  5. The aortic valve has an indeterminant number of cusps. Aortic valve  regurgitation is trivial. Mild aortic valve sclerosis is present, with no  evidence of aortic valve stenosis.  6. The inferior vena cava is normal in size with <50% respiratory  variability, suggesting right atrial pressure of 8 mmHg.   Patient Profile     64 y.o. male into the hospital with shortness of breath, found to have systolic heart failure  Assessment & Plan    1.  Systolic heart failure: This is a new diagnosis.  His ejection fraction is 20 to  25%.  He is net out 1.3 L.  Creatinine has mildly elevated.  He continues to have significant lower extremity edema and crackles in his lung bases.  We Moneka Mcquinn continue with IV diuresis.  2.  Troponin elevation: Potentially due to systolic heart failure, though non-STEMI cannot completely be ruled out.  He Zalyn Amend need an ischemic evaluation prior to discharge.  We Jayleon Mcfarlane plan potentially for left heart catheterization tomorrow.  Right heart catheterization may also be beneficial to further define his hemodynamics.  We Alazia Crocket make him n.p.o. after midnight for potential catheterization tomorrow.      For questions or updates, please contact [facility] Please consult [email or web address] for contact info under        Signed, Zeth Buday Jorja Loa, MD  03/27/2019, 1:34 PM

## 2019-03-27 NOTE — Progress Notes (Signed)
ANTICOAGULATION CONSULT NOTE -  Pharmacy Consult for Heparin Indication: chest pain/ACS  Allergies  Allergen Reactions  . Codeine Nausea And Vomiting  . Morphine And Related Other (See Comments)    Reaction: unknown    Patient Measurements: Height: 5\' 8"  (172.7 cm) Weight: 161 lb 6.4 oz (73.2 kg) IBW/kg (Calculated) : 68.4 HEPARIN DW (KG): 68.5  Vital Signs: Temp: 98.3 F (36.8 C) (03/07 0816) Temp Source: Oral (03/07 0615) BP: 123/69 (03/07 1019) Pulse Rate: 68 (03/07 1019)  Labs: Recent Labs    03/25/19 2131 03/25/19 2131 03/25/19 2344 03/26/19 0510 03/26/19 1244 03/26/19 2059 03/27/19 0307 03/27/19 1054  HGB 13.7   < >  --  12.9*  --   --  12.4*  --   HCT 42.8  --   --  38.9*  --   --  38.2*  --   PLT 185  --   --  175  --   --  171  --   APTT  --   --  39*  --   --   --   --   --   LABPROT  --   --  14.6  --   --   --   --   --   INR  --   --  1.2  --   --   --   --   --   HEPARINUNFRC  --   --   --  <0.10*   < > 0.30 0.20* 0.24*  CREATININE 0.97  --   --  1.00  --   --  1.26*  --   TROPONINIHS 781*  --  787* 629*  --   --   --   --    < > = values in this interval not displayed.    Estimated Creatinine Clearance: 57.3 mL/min (A) (by C-G formula based on SCr of 1.26 mg/dL (H)).  Medical History: Past Medical History:  Diagnosis Date  . Chronic pain of right hip   . Erectile dysfunction    Medications:  Medications Prior to Admission  Medication Sig Dispense Refill Last Dose  . atorvastatin (LIPITOR) 20 MG tablet Take 1 tablet (20 mg total) by mouth daily. 90 tablet 4 03/25/2019 at Unknown time  . glipiZIDE (GLUCOTROL) 5 MG tablet Take 1 tablet (5 mg total) by mouth daily before breakfast. 90 tablet 3 03/25/2019 at Unknown time  . HYDROcodone-acetaminophen (NORCO/VICODIN) 5-325 MG tablet Twice daily for pain (Patient taking differently: Take 1 tablet by mouth 2 (two) times daily. ) 60 tablet 0 03/25/2019 at Unknown time  . Lancet Devices MISC Use to check  blood sugar twice a day 100 each 5   . montelukast (SINGULAIR) 10 MG tablet Take 1 tablet (10 mg total) by mouth at bedtime. (Patient not taking: Reported on 08/27/2018) 30 tablet 5 Not Taking at Unknown time    Assessment: Pharmacy consulted for Heparin for ACS.  No anticoagulants PTA per current med list.  Baseline labs ordered.   3/6 0510 HL < 0.1  3/6 1244 HL 0.2  3/6 2059 HL 0.3 3/7 0307 HL 0.2 3/7 1054 HL 0.24   Goal of Therapy:  Heparin level 0.3-0.7 units/ml Monitor platelets by anticoagulation protocol: Yes   Plan:  Heparin level subtherapeutic. CBC ok.  Will give 1000 unit rebolus and increase infusion to 1600 units/hr.  Recheck HL ~ 6 hours after rate increase. CBC stable. CBC daily while on heparin. Plan for cath early this  week.   Oswald Hillock, PharmD, BCPS 03/27/2019,11:41 AM

## 2019-03-27 NOTE — Progress Notes (Signed)
ANTICOAGULATION CONSULT NOTE -  Pharmacy Consult for Heparin Indication: chest pain/ACS  Allergies  Allergen Reactions  . Codeine Nausea And Vomiting  . Morphine And Related Other (See Comments)    Reaction: unknown    Patient Measurements: Height: 5\' 8"  (172.7 cm) Weight: 161 lb 6.4 oz (73.2 kg) IBW/kg (Calculated) : 68.4 HEPARIN DW (KG): 68.5  Vital Signs: Temp: 98.5 F (36.9 C) (03/07 1628) BP: 104/64 (03/07 1628) Pulse Rate: 66 (03/07 1628)  Labs: Recent Labs    03/25/19 2131 03/25/19 2131 03/25/19 2344 03/26/19 0510 03/26/19 1244 03/27/19 0307 03/27/19 1054 03/27/19 1811  HGB 13.7   < >  --  12.9*  --  12.4*  --   --   HCT 42.8  --   --  38.9*  --  38.2*  --   --   PLT 185  --   --  175  --  171  --   --   APTT  --   --  39*  --   --   --   --   --   LABPROT  --   --  14.6  --   --   --   --   --   INR  --   --  1.2  --   --   --   --   --   HEPARINUNFRC  --   --   --  <0.10*   < > 0.20* 0.24* 0.55  CREATININE 0.97  --   --  1.00  --  1.26*  --   --   TROPONINIHS 781*  --  787* 629*  --   --   --   --    < > = values in this interval not displayed.    Estimated Creatinine Clearance: 57.3 mL/min (A) (by C-G formula based on SCr of 1.26 mg/dL (H)).  Medical History: Past Medical History:  Diagnosis Date  . Chronic pain of right hip   . Erectile dysfunction    Medications:  Medications Prior to Admission  Medication Sig Dispense Refill Last Dose  . atorvastatin (LIPITOR) 20 MG tablet Take 1 tablet (20 mg total) by mouth daily. 90 tablet 4 03/25/2019 at Unknown time  . glipiZIDE (GLUCOTROL) 5 MG tablet Take 1 tablet (5 mg total) by mouth daily before breakfast. 90 tablet 3 03/25/2019 at Unknown time  . HYDROcodone-acetaminophen (NORCO/VICODIN) 5-325 MG tablet Twice daily for pain (Patient taking differently: Take 1 tablet by mouth 2 (two) times daily. ) 60 tablet 0 03/25/2019 at Unknown time  . Lancet Devices MISC Use to check blood sugar twice a day 100 each 5    . montelukast (SINGULAIR) 10 MG tablet Take 1 tablet (10 mg total) by mouth at bedtime. (Patient not taking: Reported on 08/27/2018) 30 tablet 5 Not Taking at Unknown time    Assessment: Pharmacy consulted for Heparin for ACS.  No anticoagulants PTA per current med list.  Baseline labs ordered.   3/6 0510 HL < 0.1  3/6 1244 HL 0.2  3/6 2059 HL 0.3 3/7 0307 HL 0.2 3/7 1054 HL 0.24  3/7 1811 HL 0.55  Goal of Therapy:  Heparin level 0.3-0.7 units/ml Monitor platelets by anticoagulation protocol: Yes   Plan:  Heparin level subtherapeutic. CBC ok.  Will give 1000 unit rebolus and increase infusion to 1600 units/hr.  Recheck HL ~ 6 hours after rate increase. CBC stable. CBC daily while on heparin. Plan for cath early this week.  03/7:  HL @ 1811 = 0.55 Will continue pt on current rate and draw confirmation level in 6 hrs on 3/8 @ 0000.     Kylani Wires D, PharmD 03/27/2019,7:06 PM
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
## 2019-03-27 NOTE — Plan of Care (Addendum)
VSS. NSR on tele. Chronic R hip/leg pain managed w/ scheduled medication. Falls precautions maintained. Assessment as documented. IV diuresis continued per order. 1500 fluid restriction education reinforced. Pt has multiple drink containers in room. Pt communicates understanding. Heparin drip infusing per order. NPO @ 0000 for probable heart catheterization. No procedure orders at this time. POC reviewed. Questions answered.   Problem: Education: Goal: Knowledge of General Education information will improve Description: Including pain rating scale, medication(s)/side effects and non-pharmacologic comfort measures Outcome: Progressing   Problem: Health Behavior/Discharge Planning: Goal: Ability to manage health-related needs will improve Outcome: Progressing   Problem: Clinical Measurements: Goal: Ability to maintain clinical measurements within normal limits will improve Outcome: Progressing Goal: Will remain free from infection Outcome: Progressing Goal: Diagnostic test results will improve Outcome: Progressing Goal: Respiratory complications will improve Outcome: Progressing Goal: Cardiovascular complication will be avoided Outcome: Progressing   Problem: Activity: Goal: Risk for activity intolerance will decrease Outcome: Progressing   Problem: Nutrition: Goal: Adequate nutrition will be maintained Outcome: Progressing   Problem: Coping: Goal: Level of anxiety will decrease Outcome: Progressing   Problem: Elimination: Goal: Will not experience complications related to bowel motility Outcome: Progressing Goal: Will not experience complications related to urinary retention Outcome: Progressing   Problem: Pain Managment: Goal: General experience of comfort will improve Outcome: Progressing   Problem: Safety: Goal: Ability to remain free from injury will improve Outcome: Progressing   Problem: Skin Integrity: Goal: Risk for impaired skin integrity will  decrease Outcome: Progressing

## 2019-03-28 ENCOUNTER — Encounter: Payer: Self-pay | Admitting: Cardiology

## 2019-03-28 ENCOUNTER — Other Ambulatory Visit: Payer: Self-pay | Admitting: *Deleted

## 2019-03-28 ENCOUNTER — Inpatient Hospital Stay (HOSPITAL_COMMUNITY)
Admission: AD | Admit: 2019-03-28 | Discharge: 2019-04-07 | DRG: 235 | Disposition: A | Discharge status: Home Health | Payer: Self-pay | Source: Other Acute Inpatient Hospital | Attending: Cardiothoracic Surgery | Admitting: Cardiothoracic Surgery

## 2019-03-28 ENCOUNTER — Encounter
Admission: EM | Disposition: A | Discharge status: Other Acute Inpatient Hospital | Payer: Self-pay | Source: Ambulatory Visit | Attending: Internal Medicine

## 2019-03-28 ENCOUNTER — Inpatient Hospital Stay: Payer: Self-pay

## 2019-03-28 DIAGNOSIS — I5021 Acute systolic (congestive) heart failure: Secondary | ICD-10-CM

## 2019-03-28 DIAGNOSIS — M79606 Pain in leg, unspecified: Secondary | ICD-10-CM | POA: Diagnosis present

## 2019-03-28 DIAGNOSIS — E875 Hyperkalemia: Secondary | ICD-10-CM | POA: Diagnosis present

## 2019-03-28 DIAGNOSIS — I2510 Atherosclerotic heart disease of native coronary artery without angina pectoris: Principal | ICD-10-CM | POA: Diagnosis present

## 2019-03-28 DIAGNOSIS — N179 Acute kidney failure, unspecified: Secondary | ICD-10-CM

## 2019-03-28 DIAGNOSIS — I5020 Unspecified systolic (congestive) heart failure: Secondary | ICD-10-CM

## 2019-03-28 DIAGNOSIS — D649 Anemia, unspecified: Secondary | ICD-10-CM

## 2019-03-28 DIAGNOSIS — Z7982 Long term (current) use of aspirin: Secondary | ICD-10-CM

## 2019-03-28 DIAGNOSIS — N189 Chronic kidney disease, unspecified: Secondary | ICD-10-CM | POA: Diagnosis present

## 2019-03-28 DIAGNOSIS — I255 Ischemic cardiomyopathy: Secondary | ICD-10-CM | POA: Diagnosis present

## 2019-03-28 DIAGNOSIS — Z993 Dependence on wheelchair: Secondary | ICD-10-CM

## 2019-03-28 DIAGNOSIS — Z951 Presence of aortocoronary bypass graft: Secondary | ICD-10-CM

## 2019-03-28 DIAGNOSIS — E118 Type 2 diabetes mellitus with unspecified complications: Secondary | ICD-10-CM | POA: Diagnosis present

## 2019-03-28 DIAGNOSIS — Z79899 Other long term (current) drug therapy: Secondary | ICD-10-CM

## 2019-03-28 DIAGNOSIS — I5023 Acute on chronic systolic (congestive) heart failure: Secondary | ICD-10-CM | POA: Diagnosis present

## 2019-03-28 DIAGNOSIS — E1122 Type 2 diabetes mellitus with diabetic chronic kidney disease: Secondary | ICD-10-CM | POA: Diagnosis present

## 2019-03-28 DIAGNOSIS — E11319 Type 2 diabetes mellitus with unspecified diabetic retinopathy without macular edema: Secondary | ICD-10-CM | POA: Diagnosis present

## 2019-03-28 DIAGNOSIS — I214 Non-ST elevation (NSTEMI) myocardial infarction: Principal | ICD-10-CM

## 2019-03-28 DIAGNOSIS — E876 Hypokalemia: Secondary | ICD-10-CM | POA: Diagnosis present

## 2019-03-28 DIAGNOSIS — Z87891 Personal history of nicotine dependence: Secondary | ICD-10-CM

## 2019-03-28 DIAGNOSIS — E785 Hyperlipidemia, unspecified: Secondary | ICD-10-CM | POA: Diagnosis present

## 2019-03-28 DIAGNOSIS — I272 Pulmonary hypertension, unspecified: Secondary | ICD-10-CM | POA: Diagnosis present

## 2019-03-28 DIAGNOSIS — D62 Acute posthemorrhagic anemia: Secondary | ICD-10-CM | POA: Diagnosis not present

## 2019-03-28 DIAGNOSIS — Z9889 Other specified postprocedural states: Secondary | ICD-10-CM

## 2019-03-28 DIAGNOSIS — G8929 Other chronic pain: Secondary | ICD-10-CM | POA: Diagnosis present

## 2019-03-28 DIAGNOSIS — I130 Hypertensive heart and chronic kidney disease with heart failure and stage 1 through stage 4 chronic kidney disease, or unspecified chronic kidney disease: Secondary | ICD-10-CM | POA: Diagnosis present

## 2019-03-28 DIAGNOSIS — Z20822 Contact with and (suspected) exposure to covid-19: Secondary | ICD-10-CM | POA: Diagnosis present

## 2019-03-28 HISTORY — DX: Essential (primary) hypertension: I10

## 2019-03-28 HISTORY — DX: Type 2 diabetes mellitus with unspecified complications: E11.8

## 2019-03-28 HISTORY — DX: Atherosclerotic heart disease of native coronary artery without angina pectoris: I25.10

## 2019-03-28 HISTORY — DX: Unspecified systolic (congestive) heart failure: I50.20

## 2019-03-28 HISTORY — PX: RIGHT/LEFT HEART CATH AND CORONARY ANGIOGRAPHY: CATH118266

## 2019-03-28 LAB — CBC
HCT: 36.5 % — ABNORMAL LOW (ref 39.0–52.0)
HCT: 37.9 % — ABNORMAL LOW (ref 39.0–52.0)
Hemoglobin: 11.7 g/dL — ABNORMAL LOW (ref 13.0–17.0)
Hemoglobin: 12.4 g/dL — ABNORMAL LOW (ref 13.0–17.0)
MCH: 28 pg (ref 26.0–34.0)
MCH: 28.3 pg (ref 26.0–34.0)
MCHC: 32.1 g/dL (ref 30.0–36.0)
MCHC: 32.7 g/dL (ref 30.0–36.0)
MCV: 87.3 fL (ref 80.0–100.0)
MCV: 86.5 fL (ref 80.0–100.0)
Platelets: 155 10*3/uL (ref 150–400)
Platelets: 187 10*3/uL (ref 150–400)
RBC: 4.18 MIL/uL — ABNORMAL LOW (ref 4.22–5.81)
RBC: 4.38 MIL/uL (ref 4.22–5.81)
RDW: 13.7 % (ref 11.5–15.5)
RDW: 13.5 % (ref 11.5–15.5)
WBC: 8.6 10*3/uL (ref 4.0–10.5)
WBC: 8.6 10*3/uL (ref 4.0–10.5)
nRBC: 0 % (ref 0.0–0.2)
nRBC: 0 % (ref 0.0–0.2)

## 2019-03-28 LAB — BASIC METABOLIC PANEL
Anion gap: 8 (ref 5–15)
BUN: 32 mg/dL — ABNORMAL HIGH (ref 8–23)
CO2: 30 mmol/L (ref 22–32)
Calcium: 8.4 mg/dL — ABNORMAL LOW (ref 8.9–10.3)
Chloride: 96 mmol/L — ABNORMAL LOW (ref 98–111)
Creatinine, Ser: 1.48 mg/dL — ABNORMAL HIGH (ref 0.61–1.24)
GFR calc Af Amer: 57 mL/min — ABNORMAL LOW (ref >60)
GFR calc non Af Amer: 49 mL/min — ABNORMAL LOW (ref >60)
Glucose, Bld: 170 mg/dL — ABNORMAL HIGH (ref 70–99)
Potassium: 3.8 mmol/L (ref 3.5–5.1)
Sodium: 134 mmol/L — ABNORMAL LOW (ref 135–145)

## 2019-03-28 LAB — GLUCOSE, CAPILLARY
Glucose-Capillary: 138 mg/dL — ABNORMAL HIGH (ref 70–99)
Glucose-Capillary: 157 mg/dL — ABNORMAL HIGH (ref 70–99)
Glucose-Capillary: 224 mg/dL — ABNORMAL HIGH (ref 70–99)

## 2019-03-28 LAB — CREATININE, SERUM
Creatinine, Ser: 1.13 mg/dL (ref 0.61–1.24)
GFR calc Af Amer: >60 mL/min (ref >60)
GFR calc non Af Amer: >60 mL/min (ref >60)

## 2019-03-28 LAB — URINE CULTURE: Culture: >=100000 — AB

## 2019-03-28 LAB — HEPARIN LEVEL (UNFRACTIONATED): Heparin Unfractionated: 0.67 IU/mL (ref 0.30–0.70)

## 2019-03-28 SURGERY — RIGHT/LEFT HEART CATH AND CORONARY ANGIOGRAPHY
Anesthesia: Moderate Sedation

## 2019-03-28 MED ORDER — HEPARIN (PORCINE) IN NACL 1000-0.9 UT/500ML-% IV SOLN
INTRAVENOUS | Status: DC | PRN
  Administered 2019-03-28: 500 mL

## 2019-03-28 MED ORDER — ASPIRIN EC 81 MG PO TBEC
81.0000 mg | DELAYED_RELEASE_TABLET | Freq: Every day | ORAL | Status: DC
  Administered 2019-03-29 – 2019-03-31 (×3): 81 mg via ORAL
  Filled 2019-03-28 (×3): qty 1

## 2019-03-28 MED ORDER — IOHEXOL 300 MG/ML  SOLN
INTRAMUSCULAR | Status: DC | PRN
  Administered 2019-03-28: 70 mL

## 2019-03-28 MED ORDER — SODIUM CHLORIDE 0.9 % IV SOLN: 250.0000 mL | INTRAVENOUS | Status: DC | PRN

## 2019-03-28 MED ORDER — NITROGLYCERIN 0.4 MG SL SUBL: 0.4000 mg | SUBLINGUAL_TABLET | SUBLINGUAL | Status: DC | PRN

## 2019-03-28 MED ORDER — LOSARTAN POTASSIUM 25 MG PO TABS: 12.5000 mg | ORAL_TABLET | Freq: Every day | ORAL | Status: DC

## 2019-03-28 MED ORDER — SODIUM CHLORIDE 0.9% FLUSH: 3.0000 mL | Freq: Two times a day (BID) | INTRAVENOUS | Status: DC

## 2019-03-28 MED ORDER — SACUBITRIL-VALSARTAN 24-26 MG PO TABS
1.0000 | ORAL_TABLET | Freq: Two times a day (BID) | ORAL | Status: DC
  Administered 2019-03-28 – 2019-03-31 (×7): 1 via ORAL
  Filled 2019-03-28 (×7): qty 1

## 2019-03-28 MED ORDER — INSULIN ASPART 100 UNIT/ML ~~LOC~~ SOLN
0.0000 [IU] | Freq: Three times a day (TID) | SUBCUTANEOUS | Status: DC
  Administered 2019-03-29: 3 [IU] via SUBCUTANEOUS
  Administered 2019-03-29: 5 [IU] via SUBCUTANEOUS
  Administered 2019-03-29 – 2019-03-30 (×3): 3 [IU] via SUBCUTANEOUS
  Administered 2019-03-30: 5 [IU] via SUBCUTANEOUS
  Administered 2019-03-31 (×2): 3 [IU] via SUBCUTANEOUS
  Administered 2019-03-31: 5 [IU] via SUBCUTANEOUS

## 2019-03-28 MED ORDER — MILRINONE LACTATE IN DEXTROSE 20-5 MG/100ML-% IV SOLN
0.2500 ug/kg/min | INTRAVENOUS | Status: DC
  Administered 2019-03-28: 0.25 ug/kg/min via INTRAVENOUS
  Filled 2019-03-28: qty 100

## 2019-03-28 MED ORDER — SODIUM CHLORIDE 0.9 % IV SOLN: INTRAVENOUS | Status: DC

## 2019-03-28 MED ORDER — INSULIN ASPART 100 UNIT/ML ~~LOC~~ SOLN: 0.0000 [IU] | Freq: Three times a day (TID) | SUBCUTANEOUS | 11 refills | Status: DC

## 2019-03-28 MED ORDER — ACETAMINOPHEN 325 MG PO TABS: 650.0000 mg | ORAL_TABLET | ORAL | Status: DC | PRN

## 2019-03-28 MED ORDER — SODIUM CHLORIDE 0.9% FLUSH: 3.0000 mL | INTRAVENOUS | Status: DC | PRN

## 2019-03-28 MED ORDER — HEPARIN SODIUM (PORCINE) 5000 UNIT/ML IJ SOLN: 5000.0000 [IU] | Freq: Three times a day (TID) | INTRAMUSCULAR | Status: DC

## 2019-03-28 MED ORDER — FENTANYL CITRATE (PF) 100 MCG/2ML IJ SOLN
INTRAMUSCULAR | Status: DC | PRN
  Administered 2019-03-28: 50 ug via INTRAVENOUS

## 2019-03-28 MED ORDER — INSULIN ASPART 100 UNIT/ML ~~LOC~~ SOLN: 0.0000 [IU] | Freq: Three times a day (TID) | SUBCUTANEOUS | Status: DC

## 2019-03-28 MED ORDER — HEPARIN SODIUM (PORCINE) 1000 UNIT/ML IJ SOLN
INTRAMUSCULAR | Status: DC | PRN
  Administered 2019-03-28: 4000 [IU] via INTRAVENOUS

## 2019-03-28 MED ORDER — FUROSEMIDE 10 MG/ML IJ SOLN
80.0000 mg | Freq: Two times a day (BID) | INTRAMUSCULAR | Status: DC
  Administered 2019-03-28 – 2019-03-30 (×5): 80 mg via INTRAVENOUS
  Filled 2019-03-28 (×5): qty 8

## 2019-03-28 MED ORDER — MILRINONE LACTATE IN DEXTROSE 20-5 MG/100ML-% IV SOLN
0.1250 ug/kg/min | INTRAVENOUS | Status: DC
  Administered 2019-03-29 – 2019-04-01 (×5): 0.25 ug/kg/min via INTRAVENOUS
  Administered 2019-04-02: 0.125 ug/kg/min via INTRAVENOUS
  Filled 2019-03-28 (×7): qty 100

## 2019-03-28 MED ORDER — HEPARIN SODIUM (PORCINE) 1000 UNIT/ML IJ SOLN
INTRAMUSCULAR | Status: AC
  Filled 2019-03-28: qty 1

## 2019-03-28 MED ORDER — CARVEDILOL 6.25 MG PO TABS: 6.2500 mg | ORAL_TABLET | Freq: Two times a day (BID) | ORAL | 0 refills | Status: DC

## 2019-03-28 MED ORDER — MILRINONE LACTATE IN DEXTROSE 20-5 MG/100ML-% IV SOLN: 0.2500 ug/kg/min | INTRAVENOUS | 0 refills | Status: DC

## 2019-03-28 MED ORDER — HYDROCODONE-ACETAMINOPHEN 5-325 MG PO TABS
1.0000 | ORAL_TABLET | Freq: Two times a day (BID) | ORAL | Status: DC
  Administered 2019-03-28 – 2019-03-31 (×7): 1 via ORAL
  Filled 2019-03-28: qty 2
  Filled 2019-03-28 (×2): qty 1
  Filled 2019-03-28: qty 2
  Filled 2019-03-28 (×3): qty 1

## 2019-03-28 MED ORDER — CARVEDILOL 6.25 MG PO TABS: 6.2500 mg | ORAL_TABLET | Freq: Two times a day (BID) | ORAL | Status: DC

## 2019-03-28 MED ORDER — MIDAZOLAM HCL 2 MG/2ML IJ SOLN
INTRAMUSCULAR | Status: DC | PRN
  Administered 2019-03-28: 1 mg via INTRAVENOUS

## 2019-03-28 MED ORDER — HEPARIN (PORCINE) IN NACL 1000-0.9 UT/500ML-% IV SOLN
INTRAVENOUS | Status: AC
  Filled 2019-03-28: qty 1000

## 2019-03-28 MED ORDER — MIDAZOLAM HCL 2 MG/2ML IJ SOLN
INTRAMUSCULAR | Status: AC
Start: 2019-03-28 — End: ?
  Filled 2019-03-28: qty 2

## 2019-03-28 MED ORDER — LOSARTAN POTASSIUM 25 MG PO TABS: 12.5000 mg | ORAL_TABLET | Freq: Every day | ORAL | 0 refills | Status: DC

## 2019-03-28 MED ORDER — FENTANYL CITRATE (PF) 100 MCG/2ML IJ SOLN
INTRAMUSCULAR | Status: AC
  Filled 2019-03-28: qty 2

## 2019-03-28 MED ORDER — VERAPAMIL HCL 2.5 MG/ML IV SOLN
INTRAVENOUS | Status: DC | PRN
  Administered 2019-03-28: 2.5 mg via INTRAVENOUS

## 2019-03-28 MED ORDER — ONDANSETRON HCL 4 MG/2ML IJ SOLN: 4.0000 mg | Freq: Four times a day (QID) | INTRAMUSCULAR | Status: DC | PRN

## 2019-03-28 MED ORDER — VERAPAMIL HCL 2.5 MG/ML IV SOLN
INTRAVENOUS | Status: AC
  Filled 2019-03-28: qty 2

## 2019-03-28 MED ORDER — ATORVASTATIN CALCIUM 10 MG PO TABS
20.0000 mg | ORAL_TABLET | Freq: Every day | ORAL | Status: DC
  Administered 2019-03-28: 20 mg via ORAL
  Filled 2019-03-28: qty 2

## 2019-03-28 MED ORDER — NITROGLYCERIN 0.4 MG SL SUBL: 0.4000 mg | SUBLINGUAL_TABLET | SUBLINGUAL | 0 refills | Status: DC | PRN

## 2019-03-28 MED ORDER — HEPARIN SODIUM (PORCINE) 5000 UNIT/ML IJ SOLN
5000.0000 [IU] | Freq: Three times a day (TID) | INTRAMUSCULAR | Status: DC
  Administered 2019-03-28 – 2019-04-01 (×11): 5000 [IU] via SUBCUTANEOUS
  Filled 2019-03-28 (×10): qty 1

## 2019-03-28 SURGICAL SUPPLY — 12 items
CATH BALLN WEDGE 5F 110CM (CATHETERS) ×3 IMPLANT
CATH INFINITI 5 FR JL3.5 (CATHETERS) ×3 IMPLANT
CATH INFINITI 5FR JK (CATHETERS) ×3 IMPLANT
CATH INFINITI JR4 5F (CATHETERS) ×3 IMPLANT
DEVICE RAD TR BAND REGULAR (VASCULAR PRODUCTS) ×3 IMPLANT
GLIDESHEATH SLEND SS 6F .021 (SHEATH) ×3 IMPLANT
GUIDEWIRE .025 260CM (WIRE) ×3 IMPLANT
KIT MANI 3VAL PERCEP (MISCELLANEOUS) ×3 IMPLANT
KIT RIGHT HEART (MISCELLANEOUS) ×3 IMPLANT
PACK CARDIAC CATH (CUSTOM PROCEDURE TRAY) ×3 IMPLANT
SHEATH GLIDE SLENDER 4/5FR (SHEATH) ×3 IMPLANT
WIRE ROSEN-J .035X260CM (WIRE) ×3 IMPLANT
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
## 2019-03-28 NOTE — Progress Notes (Signed)
ANTICOAGULATION CONSULT NOTE -  Pharmacy Consult for Heparin Indication: chest pain/ACS  Allergies  Allergen Reactions  . Codeine Nausea And Vomiting  . Morphine And Related Other (See Comments)    Reaction: unknown    Patient Measurements: Height: 5\' 8"  (172.7 cm) Weight: 161 lb 6.4 oz (73.2 kg) IBW/kg (Calculated) : 68.4 HEPARIN DW (KG): 68.5  Vital Signs: Temp: 98 F (36.7 C) (03/07 1934) Temp Source: Oral (03/07 1934) BP: 103/64 (03/07 1934) Pulse Rate: 70 (03/07 1934)  Labs: Recent Labs    03/25/19 2131 03/25/19 2131 03/25/19 2344 03/26/19 0510 03/26/19 1244 03/27/19 0307 03/27/19 0307 03/27/19 1054 03/27/19 1811 03/28/19 0039  HGB 13.7   < >  --  12.9*  --  12.4*  --   --   --  11.7*  HCT 42.8   < >  --  38.9*  --  38.2*  --   --   --  36.5*  PLT 185   < >  --  175  --  171  --   --   --  155  APTT  --   --  39*  --   --   --   --   --   --   --   LABPROT  --   --  14.6  --   --   --   --   --   --   --   INR  --   --  1.2  --   --   --   --   --   --   --   HEPARINUNFRC  --   --   --  <0.10*   < > 0.20*   < > 0.24* 0.55 0.67  CREATININE 0.97   < >  --  1.00  --  1.26*  --   --   --  1.48*  TROPONINIHS 781*  --  787* 629*  --   --   --   --   --   --    < > = values in this interval not displayed.    Estimated Creatinine Clearance: 48.8 mL/min (A) (by C-G formula based on SCr of 1.48 mg/dL (H)).  Medical History: Past Medical History:  Diagnosis Date  . Chronic pain of right hip   . Erectile dysfunction    Medications:  Medications Prior to Admission  Medication Sig Dispense Refill Last Dose  . atorvastatin (LIPITOR) 20 MG tablet Take 1 tablet (20 mg total) by mouth daily. 90 tablet 4 03/25/2019 at Unknown time  . glipiZIDE (GLUCOTROL) 5 MG tablet Take 1 tablet (5 mg total) by mouth daily before breakfast. 90 tablet 3 03/25/2019 at Unknown time  . HYDROcodone-acetaminophen (NORCO/VICODIN) 5-325 MG tablet Twice daily for pain (Patient taking  differently: Take 1 tablet by mouth 2 (two) times daily. ) 60 tablet 0 03/25/2019 at Unknown time  . Lancet Devices MISC Use to check blood sugar twice a day 100 each 5   . montelukast (SINGULAIR) 10 MG tablet Take 1 tablet (10 mg total) by mouth at bedtime. (Patient not taking: Reported on 08/27/2018) 30 tablet 5 Not Taking at Unknown time    Assessment: Pharmacy consulted for Heparin for ACS.  No anticoagulants PTA per current med list.  Baseline labs ordered.   3/6 0510 HL < 0.1  3/6 1244 HL 0.2  3/6 2059 HL 0.3 3/7 0307 HL 0.2 3/7 1054 HL 0.24  3/7 1811 HL 0.55 3/8  0039 HL 0.67, therapeutic x 2  Goal of Therapy:  Heparin level 0.3-0.7 units/ml Monitor platelets by anticoagulation protocol: Yes   Plan:  CBC stable. CBC daily while on heparin. Plan for cath early this week.   03/7:  HL @ 1811 = 0.55 Will continue pt on current rate and draw confirmation level in 6 hrs   3/8 0039 HL 0.67, therapeutic x 2.  CBC stable.  Continue heparin at current rate and f/u labs per protocol   Ena Dawley, PharmD 03/28/2019,1:16 AM

## 2019-03-28 NOTE — Plan of Care (Signed)

## 2019-03-28 NOTE — Progress Notes (Addendum)
Pt discharged to [facility] from cath lab/ powered wheelchair and pts belongings sent home with wife, Kaelum Kissick
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
## 2019-03-28 NOTE — Discharge Summary (Signed)
Physician Discharge Summary  Curtis [name]:[id number] DOB: [birth year] DOA: 03/25/2019  PCP: Malva Limes, MD  Admit date: 03/25/2019 Discharge date: 03/28/2019  Admitted From: Home Disposition: Transfer to [facility]  Recommendations for Outpatient Follow-up:  Discharge to [facility] under cardiology service (accepting cardiologist Dr. Donato Schultz)    Discharge Condition: Guarded CODE STATUS full code Diet recommendation: Heart Healthy/carb modified    Discharge Diagnoses:  Principal Problem:   Acute systolic CHF (congestive heart failure) (HCC)  Active Problems:   NSTEMI (non-ST elevated myocardial infarction) (HCC)   Ischemic cardiomyopathy   3-vessel coronary artery disease  Chronic pain of lower extremity   Type 2 diabetes mellitus with complication, without long-term current use of insulin (HCC)   Wheelchair bound  Brief narrative/HPI 64 year old male with type 2 diabetes mellitus, wheelchair-bound due to leg injury presented to the ED with 2 months of increasing shortness of breath with recent lower extremity edema that has been progressive.  Also reported having double substernal chest pressure off and on which was nonradiating. In the ED he had elevated blood pressure 159/80 mmHg, EKG showing T wave inversion in inferior leads and chest x-ray showed cardiomegaly with pulmonary vascular congestion.  Patient had elevated troponin of 781 and BNP of 650.  Patient given IV Lasix, aspirin and started on IV heparin for NSTEMI.  Cardiology consulted.   Hospital course    Principal Problem:   Acute systolic CHF Severe ischemic cardiomyopathy with EF of 25-30%, global hypokinesis on 2D echo.  Cardiac cath showing three-vessel disease.  Started on milrinone drip and recommend transfer to [facility] with inotropic support, heart failure team evaluation and possible CABG. Continue aspirin, statin, Coreg and lisinopril.    NSTEMI (non-ST elevated myocardial infarction)  (HCC) Troponin peaked at 787.  No further chest pressure.    Received 48 hours IV heparin , continue aspirin metoprolol, statin and lisinopril.  Noted for worsening renal function in a.m. lab. 2D echo and left heart catheterization as above.  Transfer to [facility].  Active Problems:   Type 2 diabetes mellitus with complication, without long-term current use of insulin (HCC) CBG stable.  A1c of 7.7.    Stable on sliding scale coverage.  Hypokalemia Replenished.       Family Communication  :  Wife updated on the phone   Discharge Instructions  Discharge Instructions    AMB referral to CHF clinic   Complete by: As directed    Amb Referral to Cardiac Rehabilitation   Complete by: As directed    Diagnosis: Heart Failure (see criteria below if ordering Phase II)   Heart Failure Type: Chronic Diastolic   After initial evaluation and assessments completed: Virtual Based Care may be provided alone or in conjunction with Phase 2 Cardiac Rehab based on patient barriers.: Yes     Allergies as of 03/28/2019      Reactions   Codeine Nausea And Vomiting   Morphine And Related Other (See Comments)   Reaction: unknown      Medication List    STOP taking these medications   glipiZIDE 5 MG tablet Commonly known as: GLUCOTROL   montelukast 10 MG tablet Commonly known as: SINGULAIR     TAKE these medications   atorvastatin 20 MG tablet Commonly known as: LIPITOR Take 1 tablet (20 mg total) by mouth daily.   carvedilol 6.25 MG tablet Commonly known as: COREG Take 1 tablet (6.25 mg total) by mouth 2 (two) times daily with a meal.  HYDROcodone-acetaminophen 5-325 MG tablet Commonly known as: NORCO/VICODIN Twice daily for pain What changed:   how much to take  how to take this  when to take this  additional instructions   insulin aspart 100 UNIT/ML injection Commonly known as: novoLOG Inject 0-15 Units into the skin 3 (three) times daily with meals.   Lancet  Devices Misc Use to check blood sugar twice a day   losartan 25 MG tablet Commonly known as: COZAAR Take 0.5 tablets (12.5 mg total) by mouth daily.   milrinone 20 MG/100 ML Soln infusion Commonly known as: PRIMACOR Inject 0.0184 mg/min into the vein continuous.   nitroGLYCERIN 0.4 MG SL tablet Commonly known as: NITROSTAT Place 1 tablet (0.4 mg total) under the tongue every 5 (five) minutes x 3 doses as needed for chest pain.      Follow-up Information    [facility] Follow up on 04/04/2019.   Specialty: Cardiology Why: at 10:30am. Enter through the [facility] Mall entrance Contact information: [address] [place] [address] [phone]         Allergies  Allergen Reactions  . Codeine Nausea And Vomiting  . Morphine And Related Other (See Comments)    Reaction: unknown     Procedures/Studies: DG Chest 1 View  Result Date: 03/25/2019 CLINICAL DATA:  64 year old male with shortness of breath. EXAM: CHEST  1 VIEW COMPARISON:  Chest radiograph dated 04/25/2008. FINDINGS: Mild cardiomegaly with probable mild vascular congestion. There is background of emphysema. No focal consolidation, pleural effusion, or pneumothorax. Atherosclerotic calcification of the aortic arch. No acute osseous pathology. IMPRESSION: Cardiomegaly with probable mild vascular congestion. Electronically Signed   By: Elgie Collard M.D.   On: 03/25/2019 21:59   CARDIAC CATHETERIZATION  Result Date: 03/28/2019  Ost LM lesion is 40% stenosed.  Ost Cx to Prox Cx lesion is 90% stenosed.  Mid Cx to Dist Cx lesion is 90% stenosed.  Prox LAD to Mid LAD lesion is 85% stenosed.  2nd Diag lesion is 90% stenosed.  1st Mrg lesion is 80% stenosed.  Mid RCA lesion is 100% stenosed.  1.  Severe moderately calcified diffuse three-vessel coronary artery disease with at least moderate ostial left main stenosis. 2.  Right heart catheterization  showed moderately to severely elevated filling pressures with RA pressure of 12 mmHg, PCW of 27 mmHg, PA pressure of 62/26 with a mean of 40 mmHg.  Severely reduced cardiac output at 3.63 with a cardiac index of 1.94. 3.  Left ventricular angiography was not performed due to renal dysfunction.  EF was 25 to 30% by echo. Recommendations: The patient has severe ischemic cardiomyopathy.  Recommend CABG for revascularization.  However, the patient has decompensated heart failure and continues to be significantly volume overloaded.  He needs to be optimized before surgery.  I started milrinone.  Diuresis should be continued after that. I discussed the case with Dr. Gala Romney and the patient will be transferred to [facility] for further management.   ECHOCARDIOGRAM COMPLETE  Result Date: 03/26/2019    ECHOCARDIOGRAM REPORT   Patient Name:   Curtis [name] Dearinger Date of Exam: 03/26/2019 Medical Rec #:  [id number]      Height:       68.0 in Accession #:    [id number]     Weight:       161.5 lb Date of Birth:  1955-01-25      BSA:  1.866 m Patient Age:    63 years       BP:           118/72 mmHg Patient Gender: M              HR:           86 bpm. Exam Location:  [facility] Procedure: 2D Echo Indications:     CHF 428.31/ I50.31  History:         Patient has no prior history of Echocardiogram examinations.  Sonographer:     Wonda Cerise RDCS Referring Phys:  [id number] Andris Baumann Diagnosing Phys: Yvonne Kendall MD IMPRESSIONS  1. Left ventricular ejection fraction, by estimation, is 25 to 30%. The left ventricle has severely decreased function. The left ventricle demonstrates global hypokinesis. The left ventricular internal cavity size was mildly dilated. There is mild left ventricular hypertrophy. Left ventricular diastolic parameters are consistent with Grade II diastolic dysfunction (pseudonormalization).  2. Right ventricular systolic function is normal. The right ventricular size is normal. mildly increased right ventricular  wall thickness. Tricuspid regurgitation signal is inadequate for assessing PA pressure.  3. Left atrial size was mildly dilated.  4. The mitral valve is degenerative. Mild mitral valve regurgitation. No evidence of mitral stenosis.  5. The aortic valve has an indeterminant number of cusps. Aortic valve regurgitation is trivial. Mild aortic valve sclerosis is present, with no evidence of aortic valve stenosis.  6. The inferior vena cava is normal in size with <50% respiratory variability, suggesting right atrial pressure of 8 mmHg. FINDINGS  Left Ventricle: Left ventricular ejection fraction, by estimation, is 25 to 30%. The left ventricle has severely decreased function. The left ventricle demonstrates global hypokinesis. The left ventricular internal cavity size was mildly dilated. There is mild left ventricular hypertrophy. Left ventricular diastolic parameters are consistent with Grade II diastolic dysfunction (pseudonormalization). Right Ventricle: The right ventricular size is normal. Mildly increased right ventricular wall thickness. Right ventricular systolic function is normal. Tricuspid regurgitation signal is inadequate for assessing PA pressure. Left Atrium: Left atrial size was mildly dilated. Right Atrium: Right atrial size was normal in size. Pericardium: There is no evidence of pericardial effusion. Mitral Valve: The mitral valve is degenerative in appearance. There is mild thickening of the mitral valve leaflet(s). Mild mitral valve regurgitation. No evidence of mitral valve stenosis. Tricuspid Valve: The tricuspid valve is not well visualized. Tricuspid valve regurgitation is trivial. Aortic Valve: The aortic valve has an indeterminant number of cusps. . There is mild thickening of the aortic valve. Aortic valve regurgitation is trivial. Mild aortic valve sclerosis is present, with no evidence of aortic valve stenosis. There is mild thickening of the aortic valve. Aortic valve peak gradient measures  4.0 mmHg. Pulmonic Valve: The pulmonic valve was not well visualized. Pulmonic valve regurgitation is mild. No evidence of pulmonic stenosis. Aorta: The aortic root and ascending aorta are structurally normal, with no evidence of dilitation. Pulmonary Artery: The pulmonary artery is not well seen. Venous: The inferior vena cava is normal in size with less than 50% respiratory variability, suggesting right atrial pressure of 8 mmHg. IAS/Shunts: No atrial level shunt detected by color flow Doppler.  LEFT VENTRICLE PLAX 2D LVIDd:         5.87 cm      Diastology LVIDs:         4.99 cm      LV e' lateral:   7.07 cm/s LV PW:  0.96 cm      LV E/e' lateral: 9.3 LV IVS:        1.23 cm      LV e' medial:    4.79 cm/s LVOT diam:     2.20 cm      LV E/e' medial:  13.8 LV SV:         36 LV SV Index:   20 LVOT Area:     3.80 cm  LV Volumes (MOD) LV vol d, MOD A2C: 133.0 ml LV vol d, MOD A4C: 118.0 ml LV vol s, MOD A2C: 103.0 ml LV vol s, MOD A4C: 85.0 ml LV SV MOD A2C:     30.0 ml LV SV MOD A4C:     118.0 ml LV SV MOD BP:      33.2 ml RIGHT VENTRICLE RV Basal diam:  2.77 cm RV S prime:     15.90 cm/s TAPSE (M-mode): 2.0 cm LEFT ATRIUM           Index       RIGHT ATRIUM           Index LA diam:      3.50 cm 1.88 cm/m  RA Area:     16.70 cm LA Vol (A2C): 73.3 ml 39.28 ml/m RA Volume:   48.60 ml  26.04 ml/m LA Vol (A4C): 35.1 ml 18.81 ml/m  AORTIC VALVE                PULMONIC VALVE AV Area (Vmax): 2.22 cm    PV Vmax:        0.68 m/s AV Vmax:        100.00 cm/s PV Peak grad:   1.8 mmHg AV Peak Grad:   4.0 mmHg    RVOT Peak grad: 1 mmHg LVOT Vmax:      58.40 cm/s LVOT Vmean:     39.200 cm/s LVOT VTI:       0.096 m  AORTA Ao Root diam: 3.50 cm Ao Asc diam:  3.00 cm MITRAL VALVE MV Area (PHT): 5.13 cm    SHUNTS MV Decel Time: 148 msec    Systemic VTI:  0.10 m MV E velocity: 66.00 cm/s  Systemic Diam: 2.20 cm MV A velocity: 52.90 cm/s MV E/A ratio:  1.25 Yvonne Kendall MD Electronically signed by Yvonne Kendall MD  Signature Date/Time: 03/26/2019/4:04:55 PM    Final        Subjective: Seen following cardiac cath.  Denies any chest discomfort.  Dyspnea better.  Discharge Exam: Vitals:   03/28/19 1149 03/28/19 1202  BP: 137/78 139/87  Pulse: 73 74  Resp: 16 17  Temp:    SpO2: 92% 93%   Vitals:   03/28/19 1031 03/28/19 1126 03/28/19 1149 03/28/19 1202  BP:  (!) 143/77 137/78 139/87  Pulse:  76 73 74  Resp:  Temp:      TempSrc:      SpO2: 94% 95% 92% 93%  Weight:      Height:        General: Not in distress HEENT: Moist with, supple neck Chest: bibasilar crackles CVs: Normal S1-S2, no murmurs GI: Soft, nondistended, nontender Musculoskeletal: 2+ pitting edema b/l    The results of significant diagnostics from this hospitalization (including imaging, microbiology, ancillary and laboratory) are listed below for reference.     Microbiology: Recent Results (from the past 240 hour(s))  Urine Culture     Status: Abnormal (Preliminary result)   Collection  Time: 03/25/19  9:35 PM   Specimen: Urine, Clean Catch  Result Value Ref Range Status   Specimen Description   Final    URINE, CLEAN CATCH Performed at [facility], [address], [place], [place] [address]    Special Requests   Final    NONE Performed at [facility], [address] [place], [place] [address]    Culture >=100,000 COLONIES/mL GRAM NEGATIVE RODS (A)  Final   Report Status PENDING  Incomplete  Respiratory Panel by RT PCR (Flu A&B, Covid) - Nasopharyngeal Swab     Status: None   Collection Time: 03/25/19 10:53 PM   Specimen: Nasopharyngeal Swab  Result Value Ref Range Status   SARS Coronavirus 2 by RT PCR NEGATIVE NEGATIVE Final    Comment: (NOTE) SARS-CoV-2 target nucleic acids are NOT DETECTED. The SARS-CoV-2 RNA is generally detectable in upper respiratoy specimens during the acute phase of infection. The lowest concentration of SARS-CoV-2 viral copies this assay can detect  is 131 copies/mL. A negative result does not preclude SARS-Cov-2 infection and should not be used as the sole basis for treatment or other patient management decisions. A negative result may occur with  improper specimen collection/handling, submission of specimen other than nasopharyngeal swab, presence of viral mutation(s) within the areas targeted by this assay, and inadequate number of viral copies (<131 copies/mL). A negative result must be combined with clinical observations, patient history, and epidemiological information. The expected result is Negative. Fact Sheet for Patients:  https://www.moore.com/ Fact Sheet for Healthcare Providers:  https://www.young.biz/ This test is not yet ap proved or cleared by the [place] FDA and  has been authorized for detection and/or diagnosis of SARS-CoV-2 by FDA under an Emergency Use Authorization (EUA). This EUA will remain  in effect (meaning this test can be used) for the duration of the COVID-19 declaration under Section 564(b)(1) of the Act, 21 U.S.C. section 360bbb-3(b)(1), unless the authorization is terminated or revoked sooner.    Influenza A by PCR NEGATIVE NEGATIVE Final   Influenza B by PCR NEGATIVE NEGATIVE Final    Comment: (NOTE) The Xpert Xpress SARS-CoV-2/FLU/RSV assay is intended as an aid in  the diagnosis of influenza from Nasopharyngeal swab specimens and  should not be used as a sole basis for treatment. Nasal washings and  aspirates are unacceptable for Xpert Xpress SARS-CoV-2/FLU/RSV  testing. Fact Sheet for Patients: https://www.moore.com/ Fact Sheet for Healthcare Providers: https://www.young.biz/ This test is not yet approved or cleared by the [place] FDA and  has been authorized for detection and/or diagnosis of SARS-CoV-2 by  FDA under an Emergency Use Authorization (EUA). This EUA will remain  in effect (meaning this  test can be used) for the duration of the  Covid-19 declaration under Section 564(b)(1) of the Act, 21  U.S.C. section 360bbb-3(b)(1), unless the authorization is  terminated or revoked. Performed at [facility], [address] [place], [place] [address]      Labs: BNP (last 3 results) Recent Labs    03/25/19 2132  BNP 650.0*   Basic Metabolic Panel: Recent Labs  Lab 03/25/19 2131 03/26/19 0510 03/27/19 0307 03/28/19 0039  NA 131* 135 137 134*  K 3.2* 3.2* 3.7 3.8  CL 94* 98 97* 96*  CO2 27 30 33* 30  GLUCOSE 183* 192* 183* 170*  BUN 19 18 24* 32*  CREATININE 0.97 1.00 1.26* 1.48*  CALCIUM 9.3 8.1* 8.5* 8.4*  MG  --  1.8  --   --  Liver Function Tests: Recent Labs  Lab 03/25/19 2131  AST 18  ALT 13  ALKPHOS 78  BILITOT 0.9  PROT 8.3*  ALBUMIN 3.6   No results for input(s): LIPASE, AMYLASE in the last 168 hours. No results for input(s): AMMONIA in the last 168 hours. CBC: Recent Labs  Lab 03/25/19 2131 03/26/19 0510 03/27/19 0307 03/28/19 0039  WBC 8.7 8.4 7.7 8.6  NEUTROABS 5.9  --   --   --   HGB 13.7 12.9* 12.4* 11.7*  HCT 42.8 38.9* 38.2* 36.5*  MCV 86.6 85.7 86.8 87.3  PLT 185 175 171 155   Cardiac Enzymes: No results for input(s): CKTOTAL, CKMB, CKMBINDEX, TROPONINI in the last 168 hours. BNP: Invalid input(s): POCBNP CBG: Recent Labs  Lab 03/27/19 0817 03/27/19 1226 03/27/19 1629 03/27/19 2052 03/28/19 0812  GLUCAP 153* 179* 127* 151* 138*   D-Dimer No results for input(s): DDIMER in the last 72 hours. Hgb A1c Recent Labs    03/25/19 2344  HGBA1C 7.7*   Lipid Profile Recent Labs    03/26/19 0510  CHOL 149  HDL 21*  LDLCALC 69  TRIG 161*  CHOLHDL 7.1   Thyroid function studies No results for input(s): TSH, T4TOTAL, T3FREE, THYROIDAB in the last 72 hours.  Invalid input(s): FREET3 Anemia work up No results for input(s): VITAMINB12, FOLATE, FERRITIN, TIBC, IRON, RETICCTPCT in the last 72  hours. Urinalysis    Component Value Date/Time   COLORURINE YELLOW (A) 03/25/2019 2132   APPEARANCEUR HAZY (A) 03/25/2019 2132   LABSPEC 1.015 03/25/2019 2132   PHURINE 6.0 03/25/2019 2132   GLUCOSEU 50 (A) 03/25/2019 2132   HGBUR MODERATE (A) 03/25/2019 2132   BILIRUBINUR NEGATIVE 03/25/2019 2132   BILIRUBINUR negative 03/10/2016 1659   KETONESUR NEGATIVE 03/25/2019 2132   PROTEINUR >=300 (A) 03/25/2019 2132   UROBILINOGEN 0.2 03/10/2016 1659   UROBILINOGEN 0.2 05/19/2008 1302   NITRITE NEGATIVE 03/25/2019 2132   LEUKOCYTESUR MODERATE (A) 03/25/2019 2132   Sepsis Labs Invalid input(s): PROCALCITONIN,  WBC,  LACTICIDVEN Microbiology Recent Results (from the past 240 hour(s))  Urine Culture     Status: Abnormal (Preliminary result)   Collection Time: 03/25/19  9:35 PM   Specimen: Urine, Clean Catch  Result Value Ref Range Status   Specimen Description   Final    URINE, CLEAN CATCH Performed at [facility], [address], [place], [place] [address]    Special Requests   Final    NONE Performed at [facility], [address] [place], [place] [address]    Culture >=100,000 COLONIES/mL GRAM NEGATIVE RODS (A)  Final   Report Status PENDING  Incomplete  Respiratory Panel by RT PCR (Flu A&B, Covid) - Nasopharyngeal Swab     Status: None   Collection Time: 03/25/19 10:53 PM   Specimen: Nasopharyngeal Swab  Result Value Ref Range Status   SARS Coronavirus 2 by RT PCR NEGATIVE NEGATIVE Final    Comment: (NOTE) SARS-CoV-2 target nucleic acids are NOT DETECTED. The SARS-CoV-2 RNA is generally detectable in upper respiratoy specimens during the acute phase of infection. The lowest concentration of SARS-CoV-2 viral copies this assay can detect is 131 copies/mL. A negative result does not preclude SARS-Cov-2 infection and should not be used as the sole basis for treatment or other patient management decisions. A negative result may occur with  improper  specimen collection/handling, submission of specimen other than nasopharyngeal swab, presence of viral mutation(s) within the areas targeted by this assay, and inadequate number  of viral copies (<131 copies/mL). A negative result must be combined with clinical observations, patient history, and epidemiological information. The expected result is Negative. Fact Sheet for Patients:  PinkCheek.be Fact Sheet for Healthcare Providers:  GravelBags.it This test is not yet ap proved or cleared by the [place] FDA and  has been authorized for detection and/or diagnosis of SARS-CoV-2 by FDA under an Emergency Use Authorization (EUA). This EUA will remain  in effect (meaning this test can be used) for the duration of the COVID-19 declaration under Section 564(b)(1) of the Act, 21 U.S.C. section 360bbb-3(b)(1), unless the authorization is terminated or revoked sooner.    Influenza A by PCR NEGATIVE NEGATIVE Final   Influenza B by PCR NEGATIVE NEGATIVE Final    Comment: (NOTE) The Xpert Xpress SARS-CoV-2/FLU/RSV assay is intended as an aid in  the diagnosis of influenza from Nasopharyngeal swab specimens and  should not be used as a sole basis for treatment. Nasal washings and  aspirates are unacceptable for Xpert Xpress SARS-CoV-2/FLU/RSV  testing. Fact Sheet for Patients: PinkCheek.be Fact Sheet for Healthcare Providers: GravelBags.it This test is not yet approved or cleared by the [place] FDA and  has been authorized for detection and/or diagnosis of SARS-CoV-2 by  FDA under an Emergency Use Authorization (EUA). This EUA will remain  in effect (meaning this test can be used) for the duration of the  Covid-19 declaration under Section 564(b)(1) of the Act, 21  U.S.C. section 360bbb-3(b)(1), unless the authorization is  terminated or revoked. Performed at [facility], [address], [place], [place] [address]      Time coordinating discharge: 35 minutes  SIGNED:   Louellen Molder, MD  [facility] 03/28/2019, 12:12 PM Pager   If 7PM-7AM, please contact night-coverage [email or web address] Password [email or web address]
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
## 2019-03-29 ENCOUNTER — Inpatient Hospital Stay (HOSPITAL_COMMUNITY): Payer: Self-pay

## 2019-03-29 ENCOUNTER — Other Ambulatory Visit: Payer: Self-pay

## 2019-03-29 DIAGNOSIS — Z01810 Encounter for preprocedural cardiovascular examination: Secondary | ICD-10-CM

## 2019-03-29 LAB — BASIC METABOLIC PANEL
Anion gap: 12 (ref 5–15)
BUN: 22 mg/dL (ref 8–23)
CO2: 27 mmol/L (ref 22–32)
Calcium: 8.5 mg/dL — ABNORMAL LOW (ref 8.9–10.3)
Chloride: 97 mmol/L — ABNORMAL LOW (ref 98–111)
Creatinine, Ser: 1.18 mg/dL (ref 0.61–1.24)
GFR calc Af Amer: >60 mL/min (ref >60)
GFR calc non Af Amer: >60 mL/min (ref >60)
Glucose, Bld: 180 mg/dL — ABNORMAL HIGH (ref 70–99)
Potassium: 3.4 mmol/L — ABNORMAL LOW (ref 3.5–5.1)
Sodium: 136 mmol/L (ref 135–145)

## 2019-03-29 LAB — COOXEMETRY PANEL
Carboxyhemoglobin: 1.6 % — ABNORMAL HIGH (ref 0.5–1.5)
Methemoglobin: 1.1 % (ref 0.0–1.5)
O2 Saturation: 55.5 %
Total hemoglobin: 10.6 g/dL — ABNORMAL LOW (ref 12.0–16.0)

## 2019-03-29 LAB — GLUCOSE, CAPILLARY
Glucose-Capillary: 173 mg/dL — ABNORMAL HIGH (ref 70–99)
Glucose-Capillary: 204 mg/dL — ABNORMAL HIGH (ref 70–99)
Glucose-Capillary: 169 mg/dL — ABNORMAL HIGH (ref 70–99)
Glucose-Capillary: 174 mg/dL — ABNORMAL HIGH (ref 70–99)

## 2019-03-29 LAB — CBC
HCT: 35.8 % — ABNORMAL LOW (ref 39.0–52.0)
Hemoglobin: 11.8 g/dL — ABNORMAL LOW (ref 13.0–17.0)
MCH: 28.2 pg (ref 26.0–34.0)
MCHC: 33 g/dL (ref 30.0–36.0)
MCV: 85.4 fL (ref 80.0–100.0)
Platelets: 169 10*3/uL (ref 150–400)
RBC: 4.19 MIL/uL — ABNORMAL LOW (ref 4.22–5.81)
RDW: 13.5 % (ref 11.5–15.5)
WBC: 8.2 10*3/uL (ref 4.0–10.5)
nRBC: 0 % (ref 0.0–0.2)

## 2019-03-29 LAB — MAGNESIUM: Magnesium: 1.7 mg/dL (ref 1.7–2.4)

## 2019-03-29 MED ORDER — POTASSIUM CHLORIDE CRYS ER 20 MEQ PO TBCR
40.0000 meq | EXTENDED_RELEASE_TABLET | ORAL | Status: AC
  Administered 2019-03-29 (×2): 40 meq via ORAL
  Filled 2019-03-29 (×2): qty 2

## 2019-03-29 MED ORDER — SODIUM CHLORIDE 0.9% FLUSH: 10.0000 mL | INTRAVENOUS | Status: DC | PRN

## 2019-03-29 MED ORDER — SPIRONOLACTONE 12.5 MG HALF TABLET
12.5000 mg | ORAL_TABLET | Freq: Every day | ORAL | Status: DC
  Administered 2019-03-29 – 2019-03-31 (×3): 12.5 mg via ORAL
  Filled 2019-03-29 (×3): qty 1

## 2019-03-29 MED ORDER — CHLORHEXIDINE GLUCONATE CLOTH 2 % EX PADS
6.0000 | MEDICATED_PAD | Freq: Every day | CUTANEOUS | Status: DC
  Administered 2019-03-30 – 2019-03-31 (×2): 6 via TOPICAL

## 2019-03-29 MED ORDER — ATORVASTATIN CALCIUM 40 MG PO TABS
40.0000 mg | ORAL_TABLET | Freq: Every day | ORAL | Status: DC
  Administered 2019-03-29 – 2019-04-06 (×8): 40 mg via ORAL
  Filled 2019-03-29 (×8): qty 1

## 2019-03-29 MED ORDER — SODIUM CHLORIDE 0.9% FLUSH
10.0000 mL | Freq: Two times a day (BID) | INTRAVENOUS | Status: DC
  Administered 2019-03-29 – 2019-03-31 (×4): 10 mL

## 2019-03-29 MED ORDER — MAGNESIUM SULFATE 2 GM/50ML IV SOLN
2.0000 g | Freq: Once | INTRAVENOUS | Status: AC
  Administered 2019-03-29: 2 g via INTRAVENOUS
  Filled 2019-03-29: qty 50

## 2019-03-29 MED ORDER — MAGNESIUM HYDROXIDE 400 MG/5ML PO SUSP
15.0000 mL | Freq: Every day | ORAL | Status: AC | PRN
  Administered 2019-03-30: 15 mL via ORAL
  Filled 2019-03-29: qty 30

## 2019-03-29 MED ORDER — CHLORHEXIDINE GLUCONATE CLOTH 2 % EX PADS: 6.0000 | MEDICATED_PAD | Freq: Every day | CUTANEOUS | Status: DC

## 2019-03-29 MED ORDER — DIGOXIN 125 MCG PO TABS
0.1250 mg | ORAL_TABLET | Freq: Every day | ORAL | Status: DC
  Administered 2019-03-29 – 2019-03-31 (×3): 0.125 mg via ORAL
  Filled 2019-03-29 (×3): qty 1

## 2019-03-29 NOTE — Progress Notes (Signed)
Peripherally Inserted Central Catheter/Midline Placement  The IV Nurse has discussed with the patient and/or persons authorized to consent for the patient, the purpose of this procedure and the potential benefits and risks involved with this procedure.  The benefits include less needle sticks, lab draws from the catheter, and the patient may be discharged home with the catheter. Risks include, but not limited to, infection, bleeding, blood clot (thrombus formation), and puncture of an artery; nerve damage and irregular heartbeat and possibility to perform a PICC exchange if needed/ordered by physician.  Alternatives to this procedure were also discussed.  Bard Power PICC patient education guide, fact sheet on infection prevention and patient information card has been provided to patient /or left at bedside.    PICC/Midline Placement Documentation  PICC Double Lumen 03/29/19 PICC Right Brachial 38 cm 0 cm (Active)  Indication for Insertion or Continuance of Line Vasoactive infusions 03/29/19 0809  Exposed Catheter (cm) 0 cm 03/29/19 0809  Site Assessment Clean;Dry;Intact 03/29/19 0809  Lumen #1 Status Flushed;Blood return noted 03/29/19 0809  Lumen #2 Status Flushed;Blood return noted 03/29/19 0809  Dressing Type Transparent 03/29/19 0809  Dressing Status Clean;Dry;Intact;Antimicrobial disc in place;Other (Comment) 03/29/19 0809  Dressing Intervention New dressing 03/29/19 0809  Dressing Change Due 04/05/19 03/29/19 0809       Reginia Forts Albarece 03/29/2019, 8:11 AM
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
## 2019-03-29 NOTE — Progress Notes (Signed)
Pre-CABG Dopplers completed. Refer to "CV Proc" under chart review to view preliminary results.  03/29/2019 10:24 AM Eula Fried., MHA, RVT, RDCS, RDMS

## 2019-03-29 NOTE — Plan of Care (Signed)
  Problem: Education: Goal: Knowledge of General Education information will improve Description: Including pain rating scale, medication(s)/side effects and non-pharmacologic comfort measures Outcome: Progressing   Problem: Health Behavior/Discharge Planning: Goal: Ability to manage health-related needs will improve Outcome: Progressing   Problem: Clinical Measurements: Goal: Ability to maintain clinical measurements within normal limits will improve Outcome: Progressing   Problem: Activity: Goal: Risk for activity intolerance will decrease Outcome: Progressing   Problem: Nutrition: Goal: Adequate nutrition will be maintained Outcome: Progressing   Problem: Coping: Goal: Level of anxiety will decrease Outcome: Progressing   Problem: Elimination: Goal: Will not experience complications related to urinary retention Outcome: Progressing   Problem: Pain Managment: Goal: General experience of comfort will improve Outcome: Progressing   Problem: Safety: Goal: Ability to remain free from injury will improve Outcome: Progressing   Problem: Skin Integrity: Goal: Risk for impaired skin integrity will decrease Outcome: Progressing   

## 2019-03-29 NOTE — Plan of Care (Signed)

## 2019-03-29 NOTE — Progress Notes (Signed)
Talked with pt regarding his mobility. He cannot use his right leg at all but his left leg is strong. He was able to move from lying to sitting on EOB following sternal precautions. He attempted to sit up from his right side using his elbow but got stuck and needed mod assist. He stood on left leg and pivoted/turned around to sit in recliner. He will need PT after surgery for help and strengthening. He does not ambulate. Discussed Moving in the Tube, IS (1500 mL right now), transferring to recliner regularly post op, and d/c planning. His wife will be with him at d/c. Gave him [facility] materials and video to view. Pt receptive. [phone] Ethelda Chick CES, ACSM 11:27 AM 03/29/2019

## 2019-03-30 ENCOUNTER — Inpatient Hospital Stay (HOSPITAL_COMMUNITY): Payer: Self-pay

## 2019-03-30 ENCOUNTER — Other Ambulatory Visit (HOSPITAL_COMMUNITY): Payer: Self-pay | Admitting: Respiratory Therapy

## 2019-03-30 DIAGNOSIS — I509 Heart failure, unspecified: Secondary | ICD-10-CM

## 2019-03-30 DIAGNOSIS — E118 Type 2 diabetes mellitus with unspecified complications: Secondary | ICD-10-CM

## 2019-03-30 DIAGNOSIS — I25110 Atherosclerotic heart disease of native coronary artery with unstable angina pectoris: Secondary | ICD-10-CM

## 2019-03-30 LAB — GLUCOSE, CAPILLARY
Glucose-Capillary: 169 mg/dL — ABNORMAL HIGH (ref 70–99)
Glucose-Capillary: 220 mg/dL — ABNORMAL HIGH (ref 70–99)
Glucose-Capillary: 186 mg/dL — ABNORMAL HIGH (ref 70–99)
Glucose-Capillary: 218 mg/dL — ABNORMAL HIGH (ref 70–99)

## 2019-03-30 LAB — COOXEMETRY PANEL
Carboxyhemoglobin: 1.2 % (ref 0.5–1.5)
Carboxyhemoglobin: 1.2 % (ref 0.5–1.5)
Methemoglobin: 0.8 % (ref 0.0–1.5)
Methemoglobin: 0.7 % (ref 0.0–1.5)
O2 Saturation: 49.4 %
O2 Saturation: 45.4 %
Total hemoglobin: 13.5 g/dL (ref 12.0–16.0)
Total hemoglobin: 13.3 g/dL (ref 12.0–16.0)

## 2019-03-30 LAB — BASIC METABOLIC PANEL
Anion gap: 15 (ref 5–15)
BUN: 19 mg/dL (ref 8–23)
CO2: 29 mmol/L (ref 22–32)
Calcium: 8.7 mg/dL — ABNORMAL LOW (ref 8.9–10.3)
Chloride: 95 mmol/L — ABNORMAL LOW (ref 98–111)
Creatinine, Ser: 1.23 mg/dL (ref 0.61–1.24)
GFR calc Af Amer: >60 mL/min (ref >60)
GFR calc non Af Amer: >60 mL/min (ref >60)
Glucose, Bld: 180 mg/dL — ABNORMAL HIGH (ref 70–99)
Potassium: 3.7 mmol/L (ref 3.5–5.1)
Sodium: 139 mmol/L (ref 135–145)

## 2019-03-30 MED ORDER — POTASSIUM CHLORIDE CRYS ER 20 MEQ PO TBCR
40.0000 meq | EXTENDED_RELEASE_TABLET | Freq: Once | ORAL | Status: AC
  Administered 2019-03-30: 40 meq via ORAL
  Filled 2019-03-30: qty 2

## 2019-03-30 NOTE — Progress Notes (Signed)
Patient c/o of constipation, no bowel movement in 5 days per patient. Nursing Standing order for Milk of Magnesia ordered and given this AM, still no bowel movement. Patient states he normally has 2-3 bowel movement a day. PA [place] paged, [place] returned call. Awaiting orders.

## 2019-03-30 NOTE — Plan of Care (Signed)
  Problem: Education: Goal: Ability to demonstrate management of disease process will improve Outcome: Progressing Goal: Ability to verbalize understanding of medication therapies will improve Outcome: Progressing Goal: Individualized Educational Video(s) Outcome: Progressing   Problem: Activity: Goal: Capacity to carry out activities will improve Outcome: Progressing   Problem: Cardiac: Goal: Ability to achieve and maintain adequate cardiopulmonary perfusion will improve Outcome: Progressing   Problem: Education: Goal: Ability to describe self-care measures that may prevent or decrease complications (Diabetes Survival Skills Education) will improve Outcome: Progressing Goal: Individualized Educational Video(s) Outcome: Progressing   Problem: Coping: Goal: Ability to adjust to condition or change in health will improve Outcome: Progressing   Problem: Fluid Volume: Goal: Ability to maintain a balanced intake and output will improve Outcome: Progressing   Problem: Health Behavior/Discharge Planning: Goal: Ability to identify and utilize available resources and services will improve Outcome: Progressing Goal: Ability to manage health-related needs will improve Outcome: Progressing   Problem: Metabolic: Goal: Ability to maintain appropriate glucose levels will improve Outcome: Progressing   Problem: Nutritional: Goal: Maintenance of adequate nutrition will improve Outcome: Progressing   Problem: Skin Integrity: Goal: Risk for impaired skin integrity will decrease Outcome: Progressing   Problem: Tissue Perfusion: Goal: Adequacy of tissue perfusion will improve Outcome: Progressing   Problem: Education: Goal: Knowledge of General Education information will improve Description: Including pain rating scale, medication(s)/side effects and non-pharmacologic comfort measures Outcome: Progressing   Problem: Health Behavior/Discharge Planning: Goal: Ability to manage  health-related needs will improve Outcome: Progressing   Problem: Clinical Measurements: Goal: Ability to maintain clinical measurements within normal limits will improve Outcome: Progressing Goal: Will remain free from infection Outcome: Progressing Goal: Diagnostic test results will improve Outcome: Progressing Goal: Respiratory complications will improve Outcome: Progressing Goal: Cardiovascular complication will be avoided Outcome: Progressing   Problem: Activity: Goal: Risk for activity intolerance will decrease Outcome: Progressing   Problem: Nutrition: Goal: Adequate nutrition will be maintained Outcome: Progressing   Problem: Coping: Goal: Level of anxiety will decrease Outcome: Progressing   Problem: Elimination: Goal: Will not experience complications related to bowel motility Outcome: Progressing Goal: Will not experience complications related to urinary retention Outcome: Progressing   Problem: Safety: Goal: Ability to remain free from injury will improve Outcome: Progressing   Problem: Skin Integrity: Goal: Risk for impaired skin integrity will decrease Outcome: Progressing

## 2019-03-30 NOTE — Consult Note (Signed)
[address]       [place] [address]             [phone]        Kayvan M Fenech [place] Medical Record #[id number] Date of Birth: Oct 12, 1955  Referring: No ref. provider found Primary Care: Birdie Sons, MD Primary Cardiologist:Muhammad Fletcher Anon, MD  Chief Complaint:   No chief complaint on file. Peripheral edema  History of Present Illness:     64 yo man with DM and s/p work-related injury to his right side rendering him wheel-chair bound due to immobility of his RLE began noticing increased edema of BLEs. When edema has progressed to his knees, he sought attention in [place]. He also noted audible rales and describes orthopnea. No real chest pain or PND. He was diagnosed with CHF based on transthoracic echo and underwent LHC showing multivessel CAD and severely reduced EF. Started on milrinone gtt and transferred to [facility]. Since arrival, he has diuresed well and he can now lie flat in bed while having a normal conversation. He states he's feeling much better.    Current Activity/ Functional Status: Patient will be independent with mobility/ambulation, transfers, ADL's, IADL's.   Zubrod Score: At the time of surgery this patient's most appropriate activity status/level should be described as: []     0    Normal activity, no symptoms []     1    Restricted in physical strenuous activity but ambulatory, able to do out light work []     2    Ambulatory and capable of self care, unable to do work activities, up and about                 more than 50%  Of the time                            []     3    Only limited self care, in bed greater than 50% of waking hours []     4    Completely disabled, no self care, confined to bed or chair []     5    Moribund  Past Medical History:  Diagnosis Date  . CAD (coronary artery disease)   . Chronic pain of right hip   . Diabetes mellitus type 2 with complications ([place])   . Erectile dysfunction   . HFrEF (heart  failure with reduced ejection fraction) ([place])   . Hypertension     Past Surgical History:  Procedure Laterality Date  . CHOLECYSTECTOMY  05/12/2011  . HAND RECONSTRUCTION    . HEMORRHOID SURGERY  10/31/2011   [facility]  . RIGHT/LEFT HEART CATH AND CORONARY ANGIOGRAPHY N/A 03/28/2019   Procedure: RIGHT/LEFT HEART CATH AND CORONARY ANGIOGRAPHY;  Surgeon: Wellington Hampshire, MD;  Location: [facility];  Service: Cardiovascular;  Laterality: N/A;    Social History   Tobacco Use  Smoking Status Former Smoker  . Packs/day: 0.75  . Types: Cigarettes  . Quit date: 04/26/2018  . Years since quitting: 0.9  Smokeless Tobacco Never Used  Tobacco Comment   2-3 ppd since 64yo. cut back to under a ppd since around 2010    Social History   Substance and Sexual Activity  Alcohol Use Yes  . Alcohol/week: 0.0 standard drinks   Comment: social     Allergies  Allergen Reactions  . Codeine Nausea And Vomiting  . Morphine And Related  Other (See Comments)    Reaction: unknown    Current Facility-Administered Medications  Medication Dose Route Frequency Provider Last Rate Last Admin  . acetaminophen (TYLENOL) tablet 650 mg  650 mg Oral Q4H PRN Robbie Lis M, PA-C      . aspirin EC tablet 81 mg  81 mg Oral Daily Robbie Lis M, PA-C   81 mg at 03/30/19 6834  . atorvastatin (LIPITOR) tablet 40 mg  40 mg Oral q1800 Allayne Butcher, PA-C   40 mg at 03/29/19 1829  . Chlorhexidine Gluconate Cloth 2 % PADS 6 each  6 each Topical Daily Jake Bathe, MD      . Chlorhexidine Gluconate Cloth 2 % PADS 6 each  6 each Topical Daily Jake Bathe, MD   6 each at 03/30/19 1022  . digoxin (LANOXIN) tablet 0.125 mg  0.125 mg Oral Daily Robbie Lis M, PA-C   0.125 mg at 03/30/19 1962  . furosemide (LASIX) injection 80 mg  80 mg Intravenous BID Robbie Lis M, PA-C   80 mg at 03/30/19 2297  . heparin injection 5,000 Units  5,000 Units Subcutaneous Q8H Allayne Butcher, PA-C    5,000 Units at 03/30/19 1349  . HYDROcodone-acetaminophen (NORCO/VICODIN) 5-325 MG per tablet 1-2 tablet  1-2 tablet Oral BID Allayne Butcher, PA-C   1 tablet at 03/30/19 9892  . insulin aspart (novoLOG) injection 0-15 Units  0-15 Units Subcutaneous TID WC Jake Bathe, MD   3 Units at 03/30/19 1645  . magnesium hydroxide (MILK OF MAGNESIA) suspension 15 mL  15 mL Oral Daily PRN Jake Bathe, MD   15 mL at 03/30/19 0841  . milrinone (PRIMACOR) 20 MG/100 ML (0.2 mg/mL) infusion  0.25 mcg/kg/min Intravenous Continuous Robbie Lis M, PA-C 5.53 mL/hr at 03/30/19 1425 0.25 mcg/kg/min at 03/30/19 1425  . nitroGLYCERIN (NITROSTAT) SL tablet 0.4 mg  0.4 mg Sublingual Q5 Min x 3 PRN Robbie Lis M, PA-C      . ondansetron [facility]) injection 4 mg  4 mg Intravenous Q6H PRN Robbie Lis M, PA-C      . sacubitril-valsartan (ENTRESTO) 24-26 mg per tablet  1 tablet Oral BID Allayne Butcher, PA-C   1 tablet at 03/30/19 1194  . sodium chloride flush (NS) 0.9 % injection 10-40 mL  10-40 mL Intracatheter Q12H Jake Bathe, MD   10 mL at 03/29/19 2117  . sodium chloride flush (NS) 0.9 % injection 10-40 mL  10-40 mL Intracatheter PRN Jake Bathe, MD      . spironolactone (ALDACTONE) tablet 12.5 mg  12.5 mg Oral Daily Robbie Lis M, PA-C   12.5 mg at 03/30/19 1740    Medications Prior to Admission  Medication Sig Dispense Refill Last Dose  . atorvastatin (LIPITOR) 20 MG tablet Take 1 tablet (20 mg total) by mouth daily. 90 tablet 4 Past Week at 1707  . HYDROcodone-acetaminophen (NORCO/VICODIN) 5-325 MG tablet Twice daily for pain (Patient taking differently: Take 1 tablet by mouth every 6 (six) hours as needed for moderate pain. ) 60 tablet 0 Past Week at 2118  . insulin aspart (NOVOLOG) 100 UNIT/ML injection Inject 0-15 Units into the skin 3 (three) times daily with meals. 10 mL 11 Past Week at 1707  . Lancet Devices MISC Use to check blood sugar twice a day 100 each 5  03/27/2019 at Unknown time  . nitroGLYCERIN (NITROSTAT) 0.4 MG SL tablet Place 1 tablet (0.4 mg total) under the tongue every 5 (  five) minutes x 3 doses as needed for chest pain. 10 tablet 0 unknown at unknown  . milrinone (PRIMACOR) 20 MG/100 ML SOLN infusion Inject 0.0184 mg/min into the vein continuous. (Patient not taking: Reported on 03/28/2019) 10 mL 0 Not Taking at Unknown time    Family History  Family history unknown: Yes     Review of Systems:   ROS A comprehensive review of systems was negative.     Cardiac Review of Systems: Y or  [    ]= no  Chest Pain [    ]  Resting SOB [   ] Exertional SOB  [  ]  Orthopnea [  ]   Pedal Edema [   ]    Palpitations [  ] Syncope  [  ]   Presyncope [   ]  General Review of Systems: [Y] = yes [  ]=no Constitional: recent weight change [  ]; anorexia [  ]; fatigue [  ]; nausea [  ]; night sweats [  ]; fever [  ]; or chills [  ]                                                               Dental: Last Dentist visit:   Eye : blurred vision [  ]; diplopia [   ]; vision changes [  ];  Amaurosis fugax[  ]; Resp: cough [  ];  wheezing[  ];  hemoptysis[  ]; shortness of breath[  ]; paroxysmal nocturnal dyspnea[  ]; dyspnea on exertion[  ]; or orthopnea[  ];  GI:  gallstones[  ], vomiting[  ];  dysphagia[  ]; melena[  ];  hematochezia [  ]; heartburn[  ];   Hx of  Colonoscopy[  ]; GU: kidney stones [  ]; hematuria[  ];   dysuria [  ];  nocturia[  ];  history of     obstruction [  ]; urinary frequency [  ]             Skin: rash, swelling[  ];, hair loss[  ];  peripheral edema[  ];  or itching[  ]; Musculosketetal: myalgias[  ];  joint swelling[  ];  joint erythema[  ];  joint pain[  ];  back pain[  ];  Heme/Lymph: bruising[  ];  bleeding[  ];  anemia[  ];  Neuro: TIA[  ];  headaches[  ];  stroke[  ];  vertigo[  ];  seizures[  ];   paresthesias[  ];  difficulty walking[  ];  Psych:depression[  ]; anxiety[  ];  Endocrine: diabetes[  ];  thyroid  dysfunction[  ];                Physical Exam: BP 91/72 (BP Location: Left Arm)   Pulse 94   Temp 98 F (36.7 C) (Oral)   Resp 20   Ht  (1.727 m)   Wt 71 kg   SpO2 91%   BMI 23.80 kg/m    General appearance: alert and cooperative Head: Normocephalic, without obvious abnormality, atraumatic Neck: no adenopathy, no carotid bruit, supple, symmetrical, trachea midline and thyroid not enlarged, symmetric, no tenderness/mass/nodules Resp: clear to auscultation bilaterally Cardio: regular rate and rhythm, S1, S2 normal, no murmur, click,  rub or gallop GI: soft, non-tender; bowel sounds normal; no masses,  no organomegaly Extremities: pulses intact; gross immobility of RLE Neurologic: Grossly normal  Diagnostic Studies & Laboratory data:     Recent Radiology Findings:   CT CHEST WO CONTRAST  Result Date: 03/30/2019 CLINICAL DATA:  Preoperative CABG, assess ascending aorta EXAM: CT CHEST WITHOUT CONTRAST TECHNIQUE: Multidetector CT imaging of the chest was performed following the standard protocol without IV contrast. COMPARISON:  CT abdomen pelvis, 11/14/2012 FINDINGS: Cardiovascular: Aortic atherosclerosis, predominantly involving the arch and descending thoracic aorta, with scattered calcific atherosclerosis of the ascending aorta. The ascending thoracic aorta lies 2.2 cm posterior to the sternal body. The right ventricle lies 3 mm posterior to the lower sternal body. The brachiocephalic vein lies 4 mm posterior to the manubrium. Normal heart size. Three-vessel coronary artery calcifications. No pericardial effusion. Right upper extremity PICC. Mediastinum/Nodes: No enlarged mediastinal, hilar, or axillary lymph nodes. Thyroid gland, trachea, and esophagus demonstrate no significant findings. 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 Mild, diffuse bilateral bronchial wall  thickening. Stable, benign 5 mm pulmonary nodule of the anterior right middle lobe no pleural effusion or pneumothorax. Upper Abdomen: No acute abnormality. Musculoskeletal: No chest wall mass or suspicious bone lesions identified. IMPRESSION: 1. Aortic atherosclerosis, predominantly involving the arch and descending thoracic aorta, with scattered calcific atherosclerosis of the ascending aorta. Aortic Atherosclerosis (ICD10-I70.0). 2. The ascending thoracic aorta lies 2.2 cm posterior to the sternal body. The right ventricle lies 3 mm posterior to the lower sternal body. The brachiocephalic vein lies 4 mm posterior to the manubrium. 3. 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 4. Mild, diffuse bilateral bronchial wall thickening, consistent nonspecific infectious or inflammatory bronchitis. 5. Three-vessel coronary artery calcifications. Electronically Signed   By: Lauralyn Primes M.D.   On: 03/30/2019 10:04   VAS US DOPPLER PRE CABG  Result Date: 03/29/2019 PREOPERATIVE VASCULAR EVALUATION  Indications:      Pre-CABG. Risk Factors:     Hyperlipidemia, Diabetes, coronary artery disease. Comparison Study: No prior study Performing Technologist: Gertie Fey MHA, RVT, RDCS, RDMS  Examination Guidelines: A complete evaluation includes B-mode imaging, spectral Doppler, color Doppler, and power Doppler as needed of all accessible portions of each vessel. Bilateral testing is considered an integral part of a complete examination. Limited examinations for reoccurring indications may be performed as noted.  Right Carotid Findings: +----------+--------+-------+--------+--------------------------------+--------+           PSV cm/sEDV    StenosisDescribe                        Comments                   cm/s                                                    +----------+--------+-------+--------+--------------------------------+--------+ CCA Prox  100     11                                                       +----------+--------+-------+--------+--------------------------------+--------+  CCA Distal76      16             smooth, heterogenous and                                                  calcific                                 +----------+--------+-------+--------+--------------------------------+--------+ ICA Prox  93      16             heterogenous, smooth and                                                  calcific                                 +----------+--------+-------+--------+--------------------------------+--------+ ICA Distal102     20                                                      +----------+--------+-------+--------+--------------------------------+--------+ ECA       138     9              smooth and heterogenous                  +----------+--------+-------+--------+--------------------------------+--------+ Portions of this table do not appear on this page. +----------+--------+-------+----------------+------------+           PSV cm/sEDV cmsDescribe        Arm Pressure +----------+--------+-------+----------------+------------+ Subclavian123            Multiphasic, WNL             +----------+--------+-------+----------------+------------+ +---------+--------+--+--------+--+---------+ VertebralPSV cm/s63EDV cm/s10Antegrade +---------+--------+--+--------+--+---------+ Left Carotid Findings: +----------+--------+--------+--------+--------+-------------------------------+           PSV cm/sEDV cm/sStenosisDescribeComments                        +----------+--------+--------+--------+--------+-------------------------------+ CCA Prox  128     16                                                      +----------+--------+--------+--------+--------+-------------------------------+ CCA Distal88      16                      branch with high resistant flow  +----------+--------+--------+--------+--------+-------------------------------+ ICA Prox  98      16                                                      +----------+--------+--------+--------+--------+-------------------------------+ ICA Distal106     24                                                      +----------+--------+--------+--------+--------+-------------------------------+  ECA       149     10                                                      +----------+--------+--------+--------+--------+-------------------------------+ +----------+--------+--------+----------------+------------+ SubclavianPSV cm/sEDV cm/sDescribe        Arm Pressure +----------+--------+--------+----------------+------------+           96              Multiphasic, [id number]          +----------+--------+--------+----------------+------------+ +---------+--------+--+--------+--+---------+ VertebralPSV cm/s45EDV cm/s12Antegrade +---------+--------+--+--------+--+---------+  ABI Findings: +---------+------------------+-----+----------+--------------------------------+ Right    Rt Pressure (mmHg)IndexWaveform  Comment                          +---------+------------------+-----+----------+--------------------------------+ Brachial                                  Unable to evaluate due to line                                             location                         +---------+------------------+-----+----------+--------------------------------+ PTA      97                0.80 monophasic                                 +---------+------------------+-----+----------+--------------------------------+ DP       100               0.83 monophasic                                 +---------+------------------+-----+----------+--------------------------------+ Great Toe86                0.71                                             +---------+------------------+-----+----------+--------------------------------+ +---------+------------------+-----+----------+-------+ Left     Lt Pressure (mmHg)IndexWaveform  Comment +---------+------------------+-----+----------+-------+ Brachial 121                    triphasic         +---------+------------------+-----+----------+-------+ PTA      102               0.84 monophasic        +---------+------------------+-----+----------+-------+ DP       100               0.83 monophasic        +---------+------------------+-----+----------+-------+ Great Toe75                0.62                   +---------+------------------+-----+----------+-------+ +-------+---------------+----------------+ ABI/TBIToday's ABI/TBIPrevious ABI/TBI +-------+---------------+----------------+ Right  0.83/0.71                       +-------+---------------+----------------+  Left   0.84/0.62                       +-------+---------------+----------------+  Right Doppler Findings: +--------+--------+-----+---------+---------------------------------------+ Site    PressureIndexDoppler  Comments                                +--------+--------+-----+---------+---------------------------------------+ Brachial                      Unable to evaluate due to line location +--------+--------+-----+---------+---------------------------------------+ Radial               triphasic                                        +--------+--------+-----+---------+---------------------------------------+ Ulnar                triphasic                                        +--------+--------+-----+---------+---------------------------------------+  Left Doppler Findings: +--------+--------+-----+---------+--------+ Site    PressureIndexDoppler  Comments +--------+--------+-----+---------+--------+ [id number]          triphasic          +--------+--------+-----+---------+--------+ Radial               triphasic         +--------+--------+-----+---------+--------+ Ulnar                triphasic         +--------+--------+-----+---------+--------+  Summary: Right Carotid: Velocities in the right ICA are consistent with a 1-39% stenosis. Left Carotid: Velocities in the left ICA are consistent with a 1-39% stenosis. Vertebrals:  Bilateral vertebral arteries demonstrate antegrade flow. Subclavians: Normal flow hemodynamics were seen in bilateral subclavian              arteries. Right ABI: Resting right ankle-brachial index indicates mild right lower extremity arterial disease. The right toe-brachial index is normal. Left ABI: Resting left ankle-brachial index indicates mild left lower extremity arterial disease. The left toe-brachial index is abnormal. Right Upper Extremity: Doppler waveforms remain within normal limits with right radial compression. Doppler waveforms remain within normal limits with right ulnar compression. Left Upper Extremity: Doppler waveforms remain within normal limits with left radial compression. Doppler waveform obliterate with left ulnar compression.  Electronically signed by Coral Else MD on 03/29/2019 at 5:53:00 PM.    Final      I have independently reviewed the above radiologic studies and discussed with the patient   Recent Lab Findings: Lab Results  Component Value Date   WBC 8.2 03/29/2019   HGB 11.8 (L) 03/29/2019   HCT 35.8 (L) 03/29/2019   PLT 169 03/29/2019   GLUCOSE 180 (H) 03/30/2019   CHOL 149 03/26/2019   TRIG 296 (H) 03/26/2019   HDL 21 (L) 03/26/2019   LDLCALC 69 03/26/2019   ALT 13 03/25/2019   AST 18 03/25/2019   NA 139 03/30/2019   K 3.7 03/30/2019   CL 95 (L) 03/30/2019   CREATININE 1.23 03/30/2019   BUN 19 03/30/2019   CO2 29 03/30/2019   TSH 5.230 (H) 09/06/2018   INR 1.2 03/25/2019   HGBA1C 7.7 (H) 03/25/2019      Assessment / Plan:  Very pleasant 64  yo man with HFrEF and multivessel CAD. He is also diabetic. There is no question that best treatment is for CABG. He is an acceptable candidate for surgery in that he is young, can otherwise perform ADLs independently, and has few other comorbidities. He also has excellent assistance from his wife. He should be offered the definitive form of treatment which is surgical revascularization. He appears medically optimized and is tentatively scheduled for surgery on Friday 04/01/19. All questions have been answered to his apparent satisfaction and is eager to proceed.     I  spent 30 minutes counseling the patient face to face.   Jalen Oberry Z. Vickey Sages, MD [phone] 03/30/2019 5:04 PM
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
## 2019-03-31 ENCOUNTER — Encounter (HOSPITAL_COMMUNITY): Payer: Self-pay

## 2019-03-31 LAB — BASIC METABOLIC PANEL
Anion gap: 13 (ref 5–15)
BUN: 23 mg/dL (ref 8–23)
CO2: 27 mmol/L (ref 22–32)
Calcium: 8.9 mg/dL (ref 8.9–10.3)
Chloride: 94 mmol/L — ABNORMAL LOW (ref 98–111)
Creatinine, Ser: 1.26 mg/dL — ABNORMAL HIGH (ref 0.61–1.24)
GFR calc Af Amer: >60 mL/min (ref >60)
GFR calc non Af Amer: 60 mL/min — ABNORMAL LOW (ref >60)
Glucose, Bld: 197 mg/dL — ABNORMAL HIGH (ref 70–99)
Potassium: 4 mmol/L (ref 3.5–5.1)
Sodium: 134 mmol/L — ABNORMAL LOW (ref 135–145)

## 2019-03-31 LAB — GLUCOSE, CAPILLARY
Glucose-Capillary: 191 mg/dL — ABNORMAL HIGH (ref 70–99)
Glucose-Capillary: 199 mg/dL — ABNORMAL HIGH (ref 70–99)
Glucose-Capillary: 237 mg/dL — ABNORMAL HIGH (ref 70–99)
Glucose-Capillary: 174 mg/dL — ABNORMAL HIGH (ref 70–99)

## 2019-03-31 LAB — TYPE AND SCREEN
ABO/RH(D): A POS
Antibody Screen: NEGATIVE

## 2019-03-31 LAB — COMPREHENSIVE METABOLIC PANEL
ALT: 17 U/L (ref 0–44)
AST: 20 U/L (ref 15–41)
Albumin: 3 g/dL — ABNORMAL LOW (ref 3.5–5.0)
Alkaline Phosphatase: 69 U/L (ref 38–126)
Anion gap: 12 (ref 5–15)
BUN: 22 mg/dL (ref 8–23)
CO2: 29 mmol/L (ref 22–32)
Calcium: 9.2 mg/dL (ref 8.9–10.3)
Chloride: 94 mmol/L — ABNORMAL LOW (ref 98–111)
Creatinine, Ser: 1.26 mg/dL — ABNORMAL HIGH (ref 0.61–1.24)
GFR calc Af Amer: >60 mL/min (ref >60)
GFR calc non Af Amer: 60 mL/min — ABNORMAL LOW (ref >60)
Glucose, Bld: 166 mg/dL — ABNORMAL HIGH (ref 70–99)
Potassium: 4.2 mmol/L (ref 3.5–5.1)
Sodium: 135 mmol/L (ref 135–145)
Total Bilirubin: 0.8 mg/dL (ref 0.3–1.2)
Total Protein: 7.5 g/dL (ref 6.5–8.1)

## 2019-03-31 LAB — APTT: aPTT: 32 seconds (ref 24–36)

## 2019-03-31 LAB — MAGNESIUM: Magnesium: 2 mg/dL (ref 1.7–2.4)

## 2019-03-31 LAB — COOXEMETRY PANEL
Carboxyhemoglobin: 1.2 % (ref 0.5–1.5)
Carboxyhemoglobin: 1 % (ref 0.5–1.5)
Methemoglobin: 0.7 % (ref 0.0–1.5)
Methemoglobin: 0.6 % (ref 0.0–1.5)
O2 Saturation: 65.5 %
O2 Saturation: 56.2 %
Total hemoglobin: 13.7 g/dL (ref 12.0–16.0)
Total hemoglobin: 13.4 g/dL (ref 12.0–16.0)

## 2019-03-31 LAB — PROTIME-INR
INR: 1.1 (ref 0.8–1.2)
Prothrombin Time: 13.8 seconds (ref 11.4–15.2)

## 2019-03-31 MED ORDER — MAGNESIUM SULFATE 50 % IJ SOLN
40.0000 meq | INTRAMUSCULAR | Status: DC
  Filled 2019-03-31: qty 9.85

## 2019-03-31 MED ORDER — TRANEXAMIC ACID (OHS) PUMP PRIME SOLUTION
2.0000 mg/kg | INTRAVENOUS | Status: DC
  Filled 2019-03-31: qty 1.43

## 2019-03-31 MED ORDER — SODIUM CHLORIDE 0.9 % IV SOLN
INTRAVENOUS | Status: DC
  Filled 2019-03-31: qty 30

## 2019-03-31 MED ORDER — EPINEPHRINE HCL 5 MG/250ML IV SOLN IN NS
0.0000 ug/min | INTRAVENOUS | Status: AC
  Administered 2019-04-01: 5 ug/min via INTRAVENOUS
  Filled 2019-03-31: qty 250

## 2019-03-31 MED ORDER — SODIUM CHLORIDE 0.9 % IV SOLN
750.0000 mg | INTRAVENOUS | Status: DC
  Filled 2019-03-31: qty 750

## 2019-03-31 MED ORDER — TEMAZEPAM 15 MG PO CAPS: 15.0000 mg | ORAL_CAPSULE | Freq: Once | ORAL | Status: DC | PRN

## 2019-03-31 MED ORDER — POTASSIUM CHLORIDE 2 MEQ/ML IV SOLN
80.0000 meq | INTRAVENOUS | Status: DC
  Filled 2019-03-31: qty 40

## 2019-03-31 MED ORDER — SORBITOL 70 % SOLN
30.0000 mL | Freq: Every day | Status: AC | PRN
  Administered 2019-03-31: 30 mL via ORAL
  Filled 2019-03-31: qty 30

## 2019-03-31 MED ORDER — CHLORHEXIDINE GLUCONATE CLOTH 2 % EX PADS: 6.0000 | MEDICATED_PAD | Freq: Once | CUTANEOUS | Status: DC

## 2019-03-31 MED ORDER — VANCOMYCIN HCL 1250 MG/250ML IV SOLN
1250.0000 mg | INTRAVENOUS | Status: AC
  Administered 2019-04-01: 1250 mg via INTRAVENOUS
  Filled 2019-03-31: qty 250

## 2019-03-31 MED ORDER — DEXMEDETOMIDINE HCL IN NACL 400 MCG/100ML IV SOLN
0.1000 ug/kg/h | INTRAVENOUS | Status: AC
  Administered 2019-04-01: 08:00:00 .4 ug/kg/h via INTRAVENOUS
  Filled 2019-03-31: qty 100

## 2019-03-31 MED ORDER — BISACODYL 5 MG PO TBEC
5.0000 mg | DELAYED_RELEASE_TABLET | Freq: Once | ORAL | Status: AC
  Administered 2019-03-31: 5 mg via ORAL
  Filled 2019-03-31: qty 1

## 2019-03-31 MED ORDER — METOPROLOL TARTRATE 12.5 MG HALF TABLET
12.5000 mg | ORAL_TABLET | Freq: Once | ORAL | Status: AC
  Administered 2019-04-01: 12.5 mg via ORAL
  Filled 2019-03-31: qty 1

## 2019-03-31 MED ORDER — SODIUM CHLORIDE 0.9 % IV SOLN
1.5000 g | INTRAVENOUS | Status: AC
  Administered 2019-04-01: 750 mg via INTRAVENOUS
  Administered 2019-04-01: 08:00:00 1.5 g via INTRAVENOUS
  Filled 2019-03-31: qty 1.5

## 2019-03-31 MED ORDER — MILRINONE LACTATE IN DEXTROSE 20-5 MG/100ML-% IV SOLN
0.3000 ug/kg/min | INTRAVENOUS | Status: DC
  Filled 2019-03-31: qty 100

## 2019-03-31 MED ORDER — PLASMA-LYTE 148 IV SOLN
INTRAVENOUS | Status: AC
  Administered 2019-04-01: 500 mL
  Filled 2019-03-31: qty 2.5

## 2019-03-31 MED ORDER — NOREPINEPHRINE 4 MG/250ML-% IV SOLN
0.0000 ug/min | INTRAVENOUS | Status: DC
  Filled 2019-03-31: qty 250

## 2019-03-31 MED ORDER — TRANEXAMIC ACID 1000 MG/10ML IV SOLN
1.5000 mg/kg/h | INTRAVENOUS | Status: AC
  Administered 2019-04-01: 1.5 mg/kg/h via INTRAVENOUS
  Filled 2019-03-31: qty 25

## 2019-03-31 MED ORDER — INSULIN REGULAR(HUMAN) IN NACL 100-0.9 UT/100ML-% IV SOLN
INTRAVENOUS | Status: AC
  Administered 2019-04-01: 5.5 [IU]/h via INTRAVENOUS
  Filled 2019-03-31: qty 100

## 2019-03-31 MED ORDER — NITROGLYCERIN IN D5W 200-5 MCG/ML-% IV SOLN
2.0000 ug/min | INTRAVENOUS | Status: AC
  Administered 2019-04-01: 09:00:00 5 ug/min via INTRAVENOUS
  Filled 2019-03-31: qty 250

## 2019-03-31 MED ORDER — CHLORHEXIDINE GLUCONATE 0.12 % MT SOLN
15.0000 mL | Freq: Once | OROMUCOSAL | Status: AC
  Administered 2019-04-01: 15 mL via OROMUCOSAL
  Filled 2019-03-31: qty 15

## 2019-03-31 MED ORDER — PHENYLEPHRINE HCL-NACL 20-0.9 MG/250ML-% IV SOLN
30.0000 ug/min | INTRAVENOUS | Status: AC
  Administered 2019-04-01: 08:00:00 35 ug/min via INTRAVENOUS
  Filled 2019-03-31: qty 250

## 2019-03-31 MED ORDER — TRANEXAMIC ACID (OHS) BOLUS VIA INFUSION
15.0000 mg/kg | INTRAVENOUS | Status: AC
  Administered 2019-04-01: 08:00:00 1071 mg via INTRAVENOUS
  Filled 2019-03-31: qty 1071

## 2019-03-31 MED ORDER — CHLORHEXIDINE GLUCONATE CLOTH 2 % EX PADS
6.0000 | MEDICATED_PAD | Freq: Once | CUTANEOUS | Status: AC
  Administered 2019-03-31: 6 via TOPICAL
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
## 2019-03-31 NOTE — Plan of Care (Signed)

## 2019-03-31 NOTE — Progress Notes (Signed)
Observed pt practicing IS. Now he can do 2100 mL, improved from 2 days ago. No other questions regarding surgery. Will need PT c/s post op. Sts his wife is buying a Metallurgist for home, which will be helpful since he can only use his left leg to stand. [phone] Ethelda Chick CES, ACSM 11:18 AM 03/31/2019

## 2019-04-01 ENCOUNTER — Inpatient Hospital Stay (HOSPITAL_COMMUNITY): Payer: Self-pay | Admitting: Certified Registered Nurse Anesthetist

## 2019-04-01 ENCOUNTER — Encounter (HOSPITAL_COMMUNITY)
Admission: AD | Disposition: A | Discharge status: Home Health | Payer: Self-pay | Source: Other Acute Inpatient Hospital | Attending: Cardiology

## 2019-04-01 ENCOUNTER — Inpatient Hospital Stay (HOSPITAL_COMMUNITY): Payer: Self-pay

## 2019-04-01 DIAGNOSIS — Z951 Presence of aortocoronary bypass graft: Secondary | ICD-10-CM

## 2019-04-01 HISTORY — PX: CORONARY ARTERY BYPASS GRAFT: SHX141

## 2019-04-01 HISTORY — PX: TEE WITHOUT CARDIOVERSION: SHX5443

## 2019-04-01 HISTORY — PX: RADIAL ARTERY HARVEST: SHX5067

## 2019-04-01 LAB — POCT I-STAT 7, (LYTES, BLD GAS, ICA,H+H)
Acid-Base Excess: 5 mmol/L — ABNORMAL HIGH (ref 0.0–2.0)
Acid-Base Excess: 5 mmol/L — ABNORMAL HIGH (ref 0.0–2.0)
Acid-Base Excess: 3 mmol/L — ABNORMAL HIGH (ref 0.0–2.0)
Bicarbonate: 30.1 mmol/L — ABNORMAL HIGH (ref 20.0–28.0)
Bicarbonate: 28.3 mmol/L — ABNORMAL HIGH (ref 20.0–28.0)
Bicarbonate: 28.5 mmol/L — ABNORMAL HIGH (ref 20.0–28.0)
Calcium, Ion: 1 mmol/L — ABNORMAL LOW (ref 1.15–1.40)
Calcium, Ion: 1.02 mmol/L — ABNORMAL LOW (ref 1.15–1.40)
Calcium, Ion: 1.45 mmol/L — ABNORMAL HIGH (ref 1.15–1.40)
HCT: 29 % — ABNORMAL LOW (ref 39.0–52.0)
HCT: 27 % — ABNORMAL LOW (ref 39.0–52.0)
HCT: 30 % — ABNORMAL LOW (ref 39.0–52.0)
Hemoglobin: 9.9 g/dL — ABNORMAL LOW (ref 13.0–17.0)
Hemoglobin: 9.2 g/dL — ABNORMAL LOW (ref 13.0–17.0)
Hemoglobin: 10.2 g/dL — ABNORMAL LOW (ref 13.0–17.0)
O2 Saturation: 100 %
O2 Saturation: 100 %
O2 Saturation: 100 %
Potassium: 4 mmol/L (ref 3.5–5.1)
Potassium: 4.6 mmol/L (ref 3.5–5.1)
Potassium: 3.8 mmol/L (ref 3.5–5.1)
Sodium: 138 mmol/L (ref 135–145)
Sodium: 134 mmol/L — ABNORMAL LOW (ref 135–145)
Sodium: 140 mmol/L (ref 135–145)
TCO2: 32 mmol/L (ref 22–32)
TCO2: 29 mmol/L (ref 22–32)
TCO2: 30 mmol/L (ref 22–32)
pCO2 arterial: 47.8 mmHg (ref 32.0–48.0)
pCO2 arterial: 36.5 mmHg (ref 32.0–48.0)
pCO2 arterial: 48.3 mmHg — ABNORMAL HIGH (ref 32.0–48.0)
pH, Arterial: 7.407 (ref 7.350–7.450)
pH, Arterial: 7.498 — ABNORMAL HIGH (ref 7.350–7.450)
pH, Arterial: 7.38 (ref 7.350–7.450)
pO2, Arterial: 340 mmHg — ABNORMAL HIGH (ref 83.0–108.0)
pO2, Arterial: 317 mmHg — ABNORMAL HIGH (ref 83.0–108.0)
pO2, Arterial: 513 mmHg — ABNORMAL HIGH (ref 83.0–108.0)

## 2019-04-01 LAB — BASIC METABOLIC PANEL
Anion gap: 11 (ref 5–15)
Anion gap: 8 (ref 5–15)
BUN: 17 mg/dL (ref 8–23)
BUN: 12 mg/dL (ref 8–23)
CO2: 28 mmol/L (ref 22–32)
CO2: 22 mmol/L (ref 22–32)
Calcium: 8.8 mg/dL — ABNORMAL LOW (ref 8.9–10.3)
Calcium: 8.3 mg/dL — ABNORMAL LOW (ref 8.9–10.3)
Chloride: 93 mmol/L — ABNORMAL LOW (ref 98–111)
Chloride: 108 mmol/L (ref 98–111)
Creatinine, Ser: 1.04 mg/dL (ref 0.61–1.24)
Creatinine, Ser: 0.94 mg/dL (ref 0.61–1.24)
GFR calc Af Amer: >60 mL/min (ref >60)
GFR calc Af Amer: >60 mL/min (ref >60)
GFR calc non Af Amer: >60 mL/min (ref >60)
GFR calc non Af Amer: >60 mL/min (ref >60)
Glucose, Bld: 255 mg/dL — ABNORMAL HIGH (ref 70–99)
Glucose, Bld: 148 mg/dL — ABNORMAL HIGH (ref 70–99)
Potassium: 3.9 mmol/L (ref 3.5–5.1)
Potassium: 5.7 mmol/L — ABNORMAL HIGH (ref 3.5–5.1)
Sodium: 132 mmol/L — ABNORMAL LOW (ref 135–145)
Sodium: 138 mmol/L (ref 135–145)

## 2019-04-01 LAB — CBC
HCT: 39.7 % (ref 39.0–52.0)
HCT: 32.6 % — ABNORMAL LOW (ref 39.0–52.0)
HCT: 31.9 % — ABNORMAL LOW (ref 39.0–52.0)
Hemoglobin: 12.6 g/dL — ABNORMAL LOW (ref 13.0–17.0)
Hemoglobin: 10.4 g/dL — ABNORMAL LOW (ref 13.0–17.0)
Hemoglobin: 10 g/dL — ABNORMAL LOW (ref 13.0–17.0)
MCH: 27.8 pg (ref 26.0–34.0)
MCH: 28.4 pg (ref 26.0–34.0)
MCH: 27.9 pg (ref 26.0–34.0)
MCHC: 31.7 g/dL (ref 30.0–36.0)
MCHC: 31.9 g/dL (ref 30.0–36.0)
MCHC: 31.3 g/dL (ref 30.0–36.0)
MCV: 87.6 fL (ref 80.0–100.0)
MCV: 89.1 fL (ref 80.0–100.0)
MCV: 89.1 fL (ref 80.0–100.0)
Platelets: 206 10*3/uL (ref 150–400)
Platelets: 147 10*3/uL — ABNORMAL LOW (ref 150–400)
Platelets: 154 10*3/uL (ref 150–400)
RBC: 4.53 MIL/uL (ref 4.22–5.81)
RBC: 3.66 MIL/uL — ABNORMAL LOW (ref 4.22–5.81)
RBC: 3.58 MIL/uL — ABNORMAL LOW (ref 4.22–5.81)
RDW: 13.6 % (ref 11.5–15.5)
RDW: 13.6 % (ref 11.5–15.5)
RDW: 13.9 % (ref 11.5–15.5)
WBC: 8.4 10*3/uL (ref 4.0–10.5)
WBC: 12.1 10*3/uL — ABNORMAL HIGH (ref 4.0–10.5)
WBC: 13.3 10*3/uL — ABNORMAL HIGH (ref 4.0–10.5)
nRBC: 0 % (ref 0.0–0.2)
nRBC: 0 % (ref 0.0–0.2)
nRBC: 0 % (ref 0.0–0.2)

## 2019-04-01 LAB — POCT I-STAT, CHEM 8
BUN: 19 mg/dL (ref 8–23)
BUN: 18 mg/dL (ref 8–23)
BUN: 15 mg/dL (ref 8–23)
BUN: 14 mg/dL (ref 8–23)
BUN: 13 mg/dL (ref 8–23)
Calcium, Ion: 1.14 mmol/L — ABNORMAL LOW (ref 1.15–1.40)
Calcium, Ion: 1.19 mmol/L (ref 1.15–1.40)
Calcium, Ion: 1.01 mmol/L — ABNORMAL LOW (ref 1.15–1.40)
Calcium, Ion: 1.01 mmol/L — ABNORMAL LOW (ref 1.15–1.40)
Calcium, Ion: 1.41 mmol/L — ABNORMAL HIGH (ref 1.15–1.40)
Chloride: 97 mmol/L — ABNORMAL LOW (ref 98–111)
Chloride: 98 mmol/L (ref 98–111)
Chloride: 95 mmol/L — ABNORMAL LOW (ref 98–111)
Chloride: 100 mmol/L (ref 98–111)
Chloride: 104 mmol/L (ref 98–111)
Creatinine, Ser: 0.9 mg/dL (ref 0.61–1.24)
Creatinine, Ser: 0.8 mg/dL (ref 0.61–1.24)
Creatinine, Ser: 0.8 mg/dL (ref 0.61–1.24)
Creatinine, Ser: 0.7 mg/dL (ref 0.61–1.24)
Creatinine, Ser: 0.7 mg/dL (ref 0.61–1.24)
Glucose, Bld: 188 mg/dL — ABNORMAL HIGH (ref 70–99)
Glucose, Bld: 128 mg/dL — ABNORMAL HIGH (ref 70–99)
Glucose, Bld: 97 mg/dL (ref 70–99)
Glucose, Bld: 115 mg/dL — ABNORMAL HIGH (ref 70–99)
Glucose, Bld: 147 mg/dL — ABNORMAL HIGH (ref 70–99)
HCT: 40 % (ref 39.0–52.0)
HCT: 35 % — ABNORMAL LOW (ref 39.0–52.0)
HCT: 27 % — ABNORMAL LOW (ref 39.0–52.0)
HCT: 26 % — ABNORMAL LOW (ref 39.0–52.0)
HCT: 30 % — ABNORMAL LOW (ref 39.0–52.0)
Hemoglobin: 13.6 g/dL (ref 13.0–17.0)
Hemoglobin: 11.9 g/dL — ABNORMAL LOW (ref 13.0–17.0)
Hemoglobin: 9.2 g/dL — ABNORMAL LOW (ref 13.0–17.0)
Hemoglobin: 8.8 g/dL — ABNORMAL LOW (ref 13.0–17.0)
Hemoglobin: 10.2 g/dL — ABNORMAL LOW (ref 13.0–17.0)
Potassium: 4.1 mmol/L (ref 3.5–5.1)
Potassium: 4.1 mmol/L (ref 3.5–5.1)
Potassium: 4.6 mmol/L (ref 3.5–5.1)
Potassium: 4.9 mmol/L (ref 3.5–5.1)
Potassium: 3.8 mmol/L (ref 3.5–5.1)
Sodium: 135 mmol/L (ref 135–145)
Sodium: 135 mmol/L (ref 135–145)
Sodium: 133 mmol/L — ABNORMAL LOW (ref 135–145)
Sodium: 136 mmol/L (ref 135–145)
Sodium: 139 mmol/L (ref 135–145)
TCO2: 30 mmol/L (ref 22–32)
TCO2: 29 mmol/L (ref 22–32)
TCO2: 28 mmol/L (ref 22–32)
TCO2: 28 mmol/L (ref 22–32)
TCO2: 29 mmol/L (ref 22–32)

## 2019-04-01 LAB — PROTIME-INR
INR: 1.4 — ABNORMAL HIGH (ref 0.8–1.2)
Prothrombin Time: 16.9 seconds — ABNORMAL HIGH (ref 11.4–15.2)

## 2019-04-01 LAB — GLUCOSE, CAPILLARY
Glucose-Capillary: 190 mg/dL — ABNORMAL HIGH (ref 70–99)
Glucose-Capillary: 132 mg/dL — ABNORMAL HIGH (ref 70–99)
Glucose-Capillary: 109 mg/dL — ABNORMAL HIGH (ref 70–99)
Glucose-Capillary: 129 mg/dL — ABNORMAL HIGH (ref 70–99)
Glucose-Capillary: 131 mg/dL — ABNORMAL HIGH (ref 70–99)
Glucose-Capillary: 129 mg/dL — ABNORMAL HIGH (ref 70–99)
Glucose-Capillary: 133 mg/dL — ABNORMAL HIGH (ref 70–99)
Glucose-Capillary: 151 mg/dL — ABNORMAL HIGH (ref 70–99)
Glucose-Capillary: 168 mg/dL — ABNORMAL HIGH (ref 70–99)
Glucose-Capillary: 168 mg/dL — ABNORMAL HIGH (ref 70–99)
Glucose-Capillary: 163 mg/dL — ABNORMAL HIGH (ref 70–99)

## 2019-04-01 LAB — APTT: aPTT: 36 seconds (ref 24–36)

## 2019-04-01 LAB — BLOOD GAS, ARTERIAL
Acid-Base Excess: 3.8 mmol/L — ABNORMAL HIGH (ref 0.0–2.0)
Bicarbonate: 27.6 mmol/L (ref 20.0–28.0)
Drawn by: 236041
FIO2: 21
O2 Saturation: 93.1 %
Patient temperature: 37
pCO2 arterial: 40.6 mmHg (ref 32.0–48.0)
pH, Arterial: 7.448 (ref 7.350–7.450)
pO2, Arterial: 67.9 mmHg — ABNORMAL LOW (ref 83.0–108.0)

## 2019-04-01 LAB — ECHO INTRAOPERATIVE TEE
Height: 68 in
Weight: 2515.01 oz

## 2019-04-01 LAB — HEMOGLOBIN AND HEMATOCRIT, BLOOD
HCT: 26 % — ABNORMAL LOW (ref 39.0–52.0)
Hemoglobin: 8.5 g/dL — ABNORMAL LOW (ref 13.0–17.0)

## 2019-04-01 LAB — COOXEMETRY PANEL
Carboxyhemoglobin: 1.5 % (ref 0.5–1.5)
Methemoglobin: 1 % (ref 0.0–1.5)
O2 Saturation: 60 %
Total hemoglobin: 13.5 g/dL (ref 12.0–16.0)

## 2019-04-01 LAB — MAGNESIUM: Magnesium: 3.3 mg/dL — ABNORMAL HIGH (ref 1.7–2.4)

## 2019-04-01 LAB — PLATELET COUNT: Platelets: 129 10*3/uL — ABNORMAL LOW (ref 150–400)

## 2019-04-01 SURGERY — CORONARY ARTERY BYPASS GRAFTING (CABG)
Anesthesia: General | Site: Chest

## 2019-04-01 MED ORDER — SODIUM CHLORIDE 0.9 % IV SOLN: INTRAVENOUS | Status: DC

## 2019-04-01 MED ORDER — EPINEPHRINE PF 1 MG/ML IJ SOLN
0.0000 ug/min | INTRAVENOUS | Status: DC
  Filled 2019-04-01: qty 4

## 2019-04-01 MED ORDER — POTASSIUM CHLORIDE 10 MEQ/50ML IV SOLN: 10.0000 meq | INTRAVENOUS | Status: AC

## 2019-04-01 MED ORDER — PROTAMINE SULFATE 10 MG/ML IV SOLN
INTRAVENOUS | Status: DC | PRN
  Administered 2019-04-01: 250 mg via INTRAVENOUS

## 2019-04-01 MED ORDER — HEMOSTATIC AGENTS (NO CHARGE) OPTIME
TOPICAL | Status: DC | PRN
  Administered 2019-04-01 (×4): 1 via TOPICAL

## 2019-04-01 MED ORDER — PANTOPRAZOLE SODIUM 40 MG PO TBEC
40.0000 mg | DELAYED_RELEASE_TABLET | Freq: Every day | ORAL | Status: DC
  Administered 2019-04-03 – 2019-04-07 (×5): 40 mg via ORAL
  Filled 2019-04-01 (×5): qty 1

## 2019-04-01 MED ORDER — SODIUM CHLORIDE (PF) 0.9 % IJ SOLN
INTRAMUSCULAR | Status: AC
  Filled 2019-04-01: qty 10

## 2019-04-01 MED ORDER — CALCIUM CHLORIDE 10 % IV SOLN
INTRAVENOUS | Status: DC | PRN
  Administered 2019-04-01: 500 mg via INTRAVENOUS

## 2019-04-01 MED ORDER — ASPIRIN 81 MG PO CHEW: 324.0000 mg | CHEWABLE_TABLET | Freq: Every day | ORAL | Status: DC

## 2019-04-01 MED ORDER — SODIUM CHLORIDE 0.9% FLUSH
10.0000 mL | Freq: Two times a day (BID) | INTRAVENOUS | Status: DC
  Administered 2019-04-01 – 2019-04-07 (×13): 10 mL

## 2019-04-01 MED ORDER — DEXMEDETOMIDINE HCL IN NACL 400 MCG/100ML IV SOLN: 0.0000 ug/kg/h | INTRAVENOUS | Status: DC

## 2019-04-01 MED ORDER — FENTANYL CITRATE (PF) 250 MCG/5ML IJ SOLN
INTRAMUSCULAR | Status: DC | PRN
  Administered 2019-04-01 (×2): 50 ug via INTRAVENOUS
  Administered 2019-04-01 (×2): 100 ug via INTRAVENOUS
  Administered 2019-04-01: 50 ug via INTRAVENOUS
  Administered 2019-04-01: 100 ug via INTRAVENOUS
  Administered 2019-04-01 (×10): 50 ug via INTRAVENOUS
  Administered 2019-04-01: 150 ug via INTRAVENOUS
  Administered 2019-04-01 (×3): 50 ug via INTRAVENOUS

## 2019-04-01 MED ORDER — ROCURONIUM BROMIDE 10 MG/ML (PF) SYRINGE
PREFILLED_SYRINGE | INTRAVENOUS | Status: AC
  Filled 2019-04-01: qty 10

## 2019-04-01 MED ORDER — CHLORHEXIDINE GLUCONATE 0.12 % MT SOLN
15.0000 mL | OROMUCOSAL | Status: AC
  Administered 2019-04-01: 15 mL via OROMUCOSAL

## 2019-04-01 MED ORDER — TRAMADOL HCL 50 MG PO TABS
50.0000 mg | ORAL_TABLET | ORAL | Status: DC | PRN
  Administered 2019-04-02: 50 mg via ORAL
  Administered 2019-04-03 – 2019-04-05 (×4): 100 mg via ORAL
  Administered 2019-04-06 – 2019-04-07 (×2): 50 mg via ORAL
  Filled 2019-04-01 (×3): qty 2
  Filled 2019-04-01: qty 1
  Filled 2019-04-01: qty 2
  Filled 2019-04-01: qty 1
  Filled 2019-04-01: qty 2

## 2019-04-01 MED ORDER — DOCUSATE SODIUM 100 MG PO CAPS
200.0000 mg | ORAL_CAPSULE | Freq: Every day | ORAL | Status: DC
  Administered 2019-04-02 – 2019-04-06 (×5): 200 mg via ORAL
  Filled 2019-04-01 (×6): qty 2

## 2019-04-01 MED ORDER — LACTATED RINGERS IV SOLN: 500.0000 mL | Freq: Once | INTRAVENOUS | Status: DC | PRN

## 2019-04-01 MED ORDER — PROTAMINE SULFATE 10 MG/ML IV SOLN
INTRAVENOUS | Status: AC
  Filled 2019-04-01: qty 25

## 2019-04-01 MED ORDER — CHLORHEXIDINE GLUCONATE CLOTH 2 % EX PADS
6.0000 | MEDICATED_PAD | Freq: Every day | CUTANEOUS | Status: DC
  Administered 2019-04-01 – 2019-04-07 (×7): 6 via TOPICAL

## 2019-04-01 MED ORDER — MIDAZOLAM HCL (PF) 10 MG/2ML IJ SOLN
INTRAMUSCULAR | Status: AC
  Filled 2019-04-01: qty 2

## 2019-04-01 MED ORDER — BUPIVACAINE LIPOSOME 1.3 % IJ SUSP
20.0000 mL | INTRAMUSCULAR | Status: AC
  Administered 2019-04-01: 20 mL
  Filled 2019-04-01: qty 20

## 2019-04-01 MED ORDER — DEXMEDETOMIDINE HCL IN NACL 400 MCG/100ML IV SOLN
0.4000 ug/kg/h | INTRAVENOUS | Status: DC
  Filled 2019-04-01: qty 100

## 2019-04-01 MED ORDER — DEXTROSE 50 % IV SOLN: 0.0000 mL | INTRAVENOUS | Status: DC | PRN

## 2019-04-01 MED ORDER — ROCURONIUM BROMIDE 10 MG/ML (PF) SYRINGE
PREFILLED_SYRINGE | INTRAVENOUS | Status: DC | PRN
  Administered 2019-04-01: 20 mg via INTRAVENOUS
  Administered 2019-04-01: 40 mg via INTRAVENOUS
  Administered 2019-04-01: 20 mg via INTRAVENOUS
  Administered 2019-04-01: 60 mg via INTRAVENOUS
  Administered 2019-04-01: 20 mg via INTRAVENOUS

## 2019-04-01 MED ORDER — ALBUMIN HUMAN 5 % IV SOLN
250.0000 mL | INTRAVENOUS | Status: AC | PRN
  Administered 2019-04-01 (×3): 12.5 g via INTRAVENOUS

## 2019-04-01 MED ORDER — BUPIVACAINE HCL (PF) 0.5 % IJ SOLN
INTRAMUSCULAR | Status: DC | PRN
  Administered 2019-04-01: 30 mL

## 2019-04-01 MED ORDER — SODIUM CHLORIDE 0.9 % IV SOLN: 250.0000 mL | INTRAVENOUS | Status: DC

## 2019-04-01 MED ORDER — 0.9 % SODIUM CHLORIDE (POUR BTL) OPTIME
TOPICAL | Status: DC | PRN
  Administered 2019-04-01: 5000 mL

## 2019-04-01 MED ORDER — PHENYLEPHRINE HCL-NACL 20-0.9 MG/250ML-% IV SOLN: 0.0000 ug/min | INTRAVENOUS | Status: DC

## 2019-04-01 MED ORDER — STERILE WATER FOR INJECTION IJ SOLN
INTRAMUSCULAR | Status: AC
  Filled 2019-04-01: qty 10

## 2019-04-01 MED ORDER — PROPOFOL 10 MG/ML IV BOLUS
INTRAVENOUS | Status: AC
  Filled 2019-04-01: qty 20

## 2019-04-01 MED ORDER — MIDAZOLAM HCL 5 MG/5ML IJ SOLN
INTRAMUSCULAR | Status: DC | PRN
  Administered 2019-04-01 (×6): 1 mg via INTRAVENOUS
  Administered 2019-04-01: 2 mg via INTRAVENOUS
  Administered 2019-04-01: 1 mg via INTRAVENOUS
  Administered 2019-04-01: 2 mg via INTRAVENOUS

## 2019-04-01 MED ORDER — ASPIRIN EC 325 MG PO TBEC: 325.0000 mg | DELAYED_RELEASE_TABLET | Freq: Every day | ORAL | Status: DC

## 2019-04-01 MED ORDER — OXYCODONE HCL 5 MG PO TABS
5.0000 mg | ORAL_TABLET | ORAL | Status: DC | PRN
  Filled 2019-04-01: qty 2

## 2019-04-01 MED ORDER — VANCOMYCIN HCL IN DEXTROSE 1-5 GM/200ML-% IV SOLN
1000.0000 mg | Freq: Once | INTRAVENOUS | Status: AC
  Administered 2019-04-01: 1000 mg via INTRAVENOUS
  Filled 2019-04-01: qty 200

## 2019-04-01 MED ORDER — BUPIVACAINE HCL (PF) 0.5 % IJ SOLN
INTRAMUSCULAR | Status: AC
  Filled 2019-04-01: qty 30

## 2019-04-01 MED ORDER — LACTATED RINGERS IV SOLN: INTRAVENOUS | Status: DC | PRN

## 2019-04-01 MED ORDER — BISACODYL 10 MG RE SUPP
10.0000 mg | Freq: Every day | RECTAL | Status: DC
  Filled 2019-04-01: qty 1

## 2019-04-01 MED ORDER — SODIUM CHLORIDE 0.9% FLUSH: 3.0000 mL | INTRAVENOUS | Status: DC | PRN

## 2019-04-01 MED ORDER — NITROGLYCERIN IN D5W 200-5 MCG/ML-% IV SOLN
7.0000 ug/min | INTRAVENOUS | Status: DC
  Administered 2019-04-02: 100 ug/min via INTRAVENOUS
  Filled 2019-04-01: qty 250

## 2019-04-01 MED ORDER — LACTATED RINGERS IV SOLN: INTRAVENOUS | Status: DC

## 2019-04-01 MED ORDER — FENTANYL CITRATE (PF) 100 MCG/2ML IJ SOLN
25.0000 ug | INTRAMUSCULAR | Status: DC | PRN
  Administered 2019-04-02: 50 ug via INTRAVENOUS
  Administered 2019-04-02: 25 ug via INTRAVENOUS
  Administered 2019-04-05: 50 ug via INTRAVENOUS
  Filled 2019-04-01 (×4): qty 2

## 2019-04-01 MED ORDER — METOCLOPRAMIDE HCL 5 MG/ML IJ SOLN
10.0000 mg | Freq: Four times a day (QID) | INTRAMUSCULAR | Status: AC
  Administered 2019-04-01 – 2019-04-02 (×4): 10 mg via INTRAVENOUS
  Filled 2019-04-01 (×4): qty 2

## 2019-04-01 MED ORDER — ACETAMINOPHEN 160 MG/5ML PO SOLN: 1000.0000 mg | Freq: Four times a day (QID) | ORAL | Status: AC

## 2019-04-01 MED ORDER — MIDAZOLAM HCL 2 MG/2ML IJ SOLN
INTRAMUSCULAR | Status: AC
  Filled 2019-04-01: qty 2

## 2019-04-01 MED ORDER — MORPHINE SULFATE (PF) 2 MG/ML IV SOLN: 1.0000 mg | INTRAVENOUS | Status: DC | PRN

## 2019-04-01 MED ORDER — PHENYLEPHRINE 40 MCG/ML (10ML) SYRINGE FOR IV PUSH (FOR BLOOD PRESSURE SUPPORT)
PREFILLED_SYRINGE | INTRAVENOUS | Status: DC | PRN
  Administered 2019-04-01 (×3): 40 ug via INTRAVENOUS
  Administered 2019-04-01: 200 ug via INTRAVENOUS

## 2019-04-01 MED ORDER — ALBUMIN HUMAN 5 % IV SOLN: INTRAVENOUS | Status: DC | PRN

## 2019-04-01 MED ORDER — FAMOTIDINE IN NACL 20-0.9 MG/50ML-% IV SOLN
20.0000 mg | Freq: Two times a day (BID) | INTRAVENOUS | Status: AC
  Administered 2019-04-01 (×2): 20 mg via INTRAVENOUS
  Filled 2019-04-01 (×2): qty 50

## 2019-04-01 MED ORDER — SODIUM CHLORIDE 0.45 % IV SOLN: INTRAVENOUS | Status: DC | PRN

## 2019-04-01 MED ORDER — SODIUM CHLORIDE 0.9 % IV SOLN
1.5000 g | Freq: Two times a day (BID) | INTRAVENOUS | Status: AC
  Administered 2019-04-01 – 2019-04-03 (×4): 1.5 g via INTRAVENOUS
  Filled 2019-04-01 (×4): qty 1.5

## 2019-04-01 MED ORDER — ONDANSETRON HCL 4 MG/2ML IJ SOLN: 4.0000 mg | Freq: Four times a day (QID) | INTRAMUSCULAR | Status: DC | PRN

## 2019-04-01 MED ORDER — FENTANYL CITRATE (PF) 250 MCG/5ML IJ SOLN
INTRAMUSCULAR | Status: AC
  Filled 2019-04-01: qty 25

## 2019-04-01 MED ORDER — NON FORMULARY
Status: DC | PRN
  Administered 2019-04-01: 25 ug via INTRAMUSCULAR

## 2019-04-01 MED ORDER — VASOPRESSIN 20 UNIT/ML IV SOLN
0.0100 [IU]/min | INTRAVENOUS | Status: DC
  Filled 2019-04-01: qty 2

## 2019-04-01 MED ORDER — KETOROLAC TROMETHAMINE 15 MG/ML IJ SOLN
15.0000 mg | Freq: Four times a day (QID) | INTRAMUSCULAR | Status: AC
  Administered 2019-04-01 – 2019-04-02 (×4): 15 mg via INTRAVENOUS
  Filled 2019-04-01 (×4): qty 1

## 2019-04-01 MED ORDER — MIDAZOLAM HCL 2 MG/2ML IJ SOLN: 2.0000 mg | INTRAMUSCULAR | Status: DC | PRN

## 2019-04-01 MED ORDER — CHLORHEXIDINE GLUCONATE 0.12% ORAL RINSE (MEDLINE KIT)
15.0000 mL | Freq: Two times a day (BID) | OROMUCOSAL | Status: DC
  Administered 2019-04-01: 15 mL via OROMUCOSAL

## 2019-04-01 MED ORDER — VANCOMYCIN HCL 1000 MG IV SOLR
INTRAVENOUS | Status: AC
  Filled 2019-04-01: qty 3000

## 2019-04-01 MED ORDER — INSULIN REGULAR(HUMAN) IN NACL 100-0.9 UT/100ML-% IV SOLN
INTRAVENOUS | Status: DC
  Administered 2019-04-02: 2.4 [IU]/h via INTRAVENOUS
  Filled 2019-04-01: qty 100

## 2019-04-01 MED ORDER — NICARDIPINE HCL IN NACL 20-0.86 MG/200ML-% IV SOLN
3.0000 mg/h | INTRAVENOUS | Status: DC
  Administered 2019-04-01 – 2019-04-02 (×2): 3 mg/h via INTRAVENOUS
  Filled 2019-04-01 (×3): qty 200

## 2019-04-01 MED ORDER — VANCOMYCIN HCL 1000 MG IV SOLR
INTRAVENOUS | Status: DC | PRN
  Administered 2019-04-01: 3 g via TOPICAL

## 2019-04-01 MED ORDER — ACETAMINOPHEN 500 MG PO TABS
1000.0000 mg | ORAL_TABLET | Freq: Four times a day (QID) | ORAL | Status: AC
  Administered 2019-04-01 – 2019-04-06 (×19): 1000 mg via ORAL
  Filled 2019-04-01 (×20): qty 2

## 2019-04-01 MED ORDER — SODIUM CHLORIDE 0.9% FLUSH
3.0000 mL | Freq: Two times a day (BID) | INTRAVENOUS | Status: DC
  Administered 2019-04-02 – 2019-04-07 (×8): 3 mL via INTRAVENOUS

## 2019-04-01 MED ORDER — HEPARIN SODIUM (PORCINE) 1000 UNIT/ML IJ SOLN
INTRAMUSCULAR | Status: AC
  Filled 2019-04-01: qty 1

## 2019-04-01 MED ORDER — MAGNESIUM SULFATE 4 GM/100ML IV SOLN
4.0000 g | Freq: Once | INTRAVENOUS | Status: AC
  Administered 2019-04-01: 4 g via INTRAVENOUS
  Filled 2019-04-01: qty 100

## 2019-04-01 MED ORDER — ARTIFICIAL TEARS OPHTHALMIC OINT
TOPICAL_OINTMENT | OPHTHALMIC | Status: AC
  Filled 2019-04-01: qty 3.5

## 2019-04-01 MED ORDER — HEPARIN SODIUM (PORCINE) 1000 UNIT/ML IJ SOLN
INTRAMUSCULAR | Status: DC | PRN
  Administered 2019-04-01: 25000 [IU] via INTRAVENOUS

## 2019-04-01 MED ORDER — ARTIFICIAL TEARS OPHTHALMIC OINT
TOPICAL_OINTMENT | OPHTHALMIC | Status: DC | PRN
  Administered 2019-04-01: 1 via OPHTHALMIC

## 2019-04-01 MED ORDER — SODIUM CHLORIDE 0.9% FLUSH
10.0000 mL | INTRAVENOUS | Status: DC | PRN
  Administered 2019-04-06: 10 mL

## 2019-04-01 MED ORDER — SODIUM CHLORIDE 0.9 % IV SOLN: INTRAVENOUS | Status: DC | PRN

## 2019-04-01 MED ORDER — ORAL CARE MOUTH RINSE
15.0000 mL | OROMUCOSAL | Status: DC
  Administered 2019-04-01 – 2019-04-02 (×7): 15 mL via OROMUCOSAL

## 2019-04-01 MED ORDER — PROPOFOL 10 MG/ML IV BOLUS
INTRAVENOUS | Status: DC | PRN
  Administered 2019-04-01: 80 mg via INTRAVENOUS

## 2019-04-01 MED ORDER — ACETAMINOPHEN 650 MG RE SUPP: 650.0000 mg | Freq: Once | RECTAL | Status: AC

## 2019-04-01 MED ORDER — BISACODYL 5 MG PO TBEC
10.0000 mg | DELAYED_RELEASE_TABLET | Freq: Every day | ORAL | Status: DC
  Administered 2019-04-02 – 2019-04-06 (×5): 10 mg via ORAL
  Filled 2019-04-01 (×6): qty 2

## 2019-04-01 MED ORDER — ACETAMINOPHEN 160 MG/5ML PO SOLN
650.0000 mg | Freq: Once | ORAL | Status: AC
  Administered 2019-04-01: 650 mg

## 2019-04-01 SURGICAL SUPPLY — 132 items
ADAPTER CARDIO PERF ANTE/RETRO (ADAPTER) ×4 IMPLANT
APPLICATOR TIP COSEAL (VASCULAR PRODUCTS) ×3 IMPLANT
APPLIER CLIP 9.375 SM OPEN (CLIP) ×4
BAG DECANTER FOR FLEXI CONT (MISCELLANEOUS) ×4 IMPLANT
BASKET HEART (ORDER IN 25'S) (MISCELLANEOUS) ×1
BASKET HEART (ORDER IN 25S) (MISCELLANEOUS) ×3 IMPLANT
BATTERY MAXDRIVER (MISCELLANEOUS) ×1 IMPLANT
BENZOIN TINCTURE PRP APPL 2/3 (GAUZE/BANDAGES/DRESSINGS) ×2 IMPLANT
BLADE CLIPPER SURG (BLADE) ×4 IMPLANT
BLADE STERNUM SYSTEM 6 (BLADE) ×4 IMPLANT
BLADE SURG 15 STRL LF DISP TIS (BLADE) ×3 IMPLANT
BLADE SURG 15 STRL SS (BLADE) ×2
BNDG ELASTIC 4X5.8 VLCR STR LF (GAUZE/BANDAGES/DRESSINGS) ×4 IMPLANT
BNDG ELASTIC 6X5.8 VLCR STR LF (GAUZE/BANDAGES/DRESSINGS) ×3 IMPLANT
BNDG GAUZE ELAST 4 BULKY (GAUZE/BANDAGES/DRESSINGS) ×4 IMPLANT
CANISTER SUCT 3000ML PPV (MISCELLANEOUS) ×4 IMPLANT
CANISTER WOUNDNEG PRESSURE 500 (CANNISTER) ×1 IMPLANT
CANNULA NON VENT 18FR 12 (CANNULA) ×1 IMPLANT
CATH CPB KIT HENDRICKSON (MISCELLANEOUS) ×4 IMPLANT
CATH RETROPLEGIA CORONARY 14FR (CATHETERS) ×1 IMPLANT
CATH ROBINSON RED A/P 18FR (CATHETERS) ×9 IMPLANT
CLIP APPLIE 9.375 SM OPEN (CLIP) ×3 IMPLANT
CLIP RETRACTION 3.0MM CORONARY (MISCELLANEOUS) ×3 IMPLANT
CLIP VESOCCLUDE MED 24/CT (CLIP) ×1 IMPLANT
CLIP VESOCCLUDE SM WIDE 24/CT (CLIP) ×1 IMPLANT
CONN ST 1/4X3/8  BEN (MISCELLANEOUS) ×2
CONN ST 1/4X3/8 BEN (MISCELLANEOUS) IMPLANT
COVER MAYO STAND STRL (DRAPES) ×6 IMPLANT
CUFF TOURN SGL QUICK 18X4 (TOURNIQUET CUFF) IMPLANT
CUFF TOURN SGL QUICK 24 (TOURNIQUET CUFF)
CUFF TRNQT CYL 24X4X16.5-23 (TOURNIQUET CUFF) IMPLANT
DERMABOND ADVANCED (GAUZE/BANDAGES/DRESSINGS) ×1
DERMABOND ADVANCED .7 DNX12 (GAUZE/BANDAGES/DRESSINGS) ×3 IMPLANT
DRAIN CHANNEL 28F RND 3/8 FF (WOUND CARE) ×12 IMPLANT
DRAPE CARDIOVASCULAR INCISE (DRAPES) ×1
DRAPE EXTREMITY T 121X128X90 (DISPOSABLE) ×2 IMPLANT
DRAPE HALF SHEET 40X57 (DRAPES) ×5 IMPLANT
DRAPE SLUSH/WARMER DISC (DRAPES) ×4 IMPLANT
DRAPE SRG 135X102X78XABS (DRAPES) ×3 IMPLANT
DRSG AQUACEL AG ADV 3.5X14 (GAUZE/BANDAGES/DRESSINGS) ×3 IMPLANT
ELECT CAUTERY BLADE 6.4 (BLADE) ×4 IMPLANT
ELECT REM PT RETURN 9FT ADLT (ELECTROSURGICAL) ×8
ELECTRODE REM PT RTRN 9FT ADLT (ELECTROSURGICAL) ×6 IMPLANT
FELT TEFLON 1X6 (MISCELLANEOUS) ×7 IMPLANT
FIBERTAPE STERNAL CLSR 2 36IN (Sternal Fixation) ×3 IMPLANT
FIBERTAPE STERNAL CLSR 2X36 (Sternal Fixation) ×2 IMPLANT
GAUZE SPONGE 4X4 12PLY STRL (GAUZE/BANDAGES/DRESSINGS) ×7 IMPLANT
GEL ULTRASOUND 20GR AQUASONIC (MISCELLANEOUS) ×4 IMPLANT
GLOVE BIO SURGEON STRL SZ 6 (GLOVE) ×2 IMPLANT
GLOVE BIO SURGEON STRL SZ 6.5 (GLOVE) ×3 IMPLANT
GLOVE BIO SURGEON STRL SZ7 (GLOVE) ×1 IMPLANT
GLOVE BIOGEL PI IND STRL 6 (GLOVE) IMPLANT
GLOVE BIOGEL PI IND STRL 6.5 (GLOVE) IMPLANT
GLOVE BIOGEL PI IND STRL 8 (GLOVE) IMPLANT
GLOVE BIOGEL PI IND STRL 9 (GLOVE) IMPLANT
GLOVE BIOGEL PI INDICATOR 6 (GLOVE) ×2
GLOVE BIOGEL PI INDICATOR 6.5 (GLOVE) ×3
GLOVE BIOGEL PI INDICATOR 8 (GLOVE) ×1
GLOVE BIOGEL PI INDICATOR 9 (GLOVE) ×1
GLOVE NEODERM STRL 7.5 LF PF (GLOVE) ×9 IMPLANT
GLOVE SURG NEODERM 7.5  LF PF (GLOVE) ×3
GOWN STRL REUS W/ TWL LRG LVL3 (GOWN DISPOSABLE) ×12 IMPLANT
GOWN STRL REUS W/TWL LRG LVL3 (GOWN DISPOSABLE) ×6
HEMOSTAT POWDER SURGIFOAM 1G (HEMOSTASIS) ×8 IMPLANT
INSERT FOGARTY XLG (MISCELLANEOUS) ×1 IMPLANT
KIT BASIN OR (CUSTOM PROCEDURE TRAY) ×4 IMPLANT
KIT PREVENA INCISION MGT20CM45 (CANNISTER) ×1 IMPLANT
KIT SUCTION CATH 14FR (SUCTIONS) ×4 IMPLANT
KIT TURNOVER KIT B (KITS) ×4 IMPLANT
KIT VASOVIEW HEMOPRO 2 VH 4000 (KITS) ×3 IMPLANT
MARKER GRAFT CORONARY BYPASS (MISCELLANEOUS) ×12 IMPLANT
NDL 18GX1X1/2 (RX/OR ONLY) (NEEDLE) ×3 IMPLANT
NEEDLE 18GX1X1/2 (RX/OR ONLY) (NEEDLE) ×4 IMPLANT
NS IRRIG 1000ML POUR BTL (IV SOLUTION) ×20 IMPLANT
PACK E OPEN HEART (SUTURE) ×4 IMPLANT
PACK OPEN HEART (CUSTOM PROCEDURE TRAY) ×4 IMPLANT
PACK SPY-PHI (KITS) ×1 IMPLANT
PAD ARMBOARD 7.5X6 YLW CONV (MISCELLANEOUS) ×8 IMPLANT
PAD ELECT DEFIB RADIOL ZOLL (MISCELLANEOUS) ×4 IMPLANT
PENCIL BUTTON HOLSTER BLD 10FT (ELECTRODE) ×6 IMPLANT
PLATE STERNAL 2.3X208 14H 2-PK (Plate) ×1 IMPLANT
POSITIONER HEAD DONUT 9IN (MISCELLANEOUS) ×4 IMPLANT
POWDER SURGICEL 3.0 GRAM (HEMOSTASIS) ×1 IMPLANT
SCREW LOCKING TI 2.3X11MM (Screw) ×6 IMPLANT
SCREW STERNAL 2.3X17MM (Screw) ×2 IMPLANT
SCREW STERNAL LOCK 2.3MM (Screw) ×4 IMPLANT
SEALANT SURG COSEAL 8ML (VASCULAR PRODUCTS) ×1 IMPLANT
SET CARDIOPLEGIA MPS 5001102 (MISCELLANEOUS) ×1 IMPLANT
SHEARS HARMONIC 9CM CVD (BLADE) IMPLANT
SHEARS HARMONIC STRL 23CM (MISCELLANEOUS) ×5 IMPLANT
SLEEVE SURGEON STRL (DRAPES) ×1 IMPLANT
SPONGE LAP 18X18 RF (DISPOSABLE) ×1 IMPLANT
STAPLER VISISTAT 35W (STAPLE) ×1 IMPLANT
SUT BONE WAX W31G (SUTURE) ×4 IMPLANT
SUT MNCRL AB 3-0 PS2 18 (SUTURE) ×8 IMPLANT
SUT MNCRL AB 4-0 PS2 18 (SUTURE) ×1 IMPLANT
SUT PDS AB 1 CTX 36 (SUTURE) ×8 IMPLANT
SUT PROLENE 3 0 SH DA (SUTURE) ×6 IMPLANT
SUT PROLENE 5 0 C 1 36 (SUTURE) IMPLANT
SUT PROLENE 6 0 C 1 30 (SUTURE) ×12 IMPLANT
SUT PROLENE 7 0 BV1 MDA (SUTURE) ×1 IMPLANT
SUT PROLENE 8 0 BV175 6 (SUTURE) ×2 IMPLANT
SUT PROLENE BLUE 7 0 (SUTURE) ×4 IMPLANT
SUT SILK  1 MH (SUTURE) ×1
SUT SILK 1 MH (SUTURE) IMPLANT
SUT SILK 2 0 SH CR/8 (SUTURE) IMPLANT
SUT SILK 3 0 SH CR/8 (SUTURE) IMPLANT
SUT STEEL 6MS V (SUTURE) ×3 IMPLANT
SUT STEEL SZ 6 DBL 3X14 BALL (SUTURE) ×3 IMPLANT
SUT VIC AB 2-0 CT1 27 (SUTURE) ×1
SUT VIC AB 2-0 CT1 TAPERPNT 27 (SUTURE) IMPLANT
SUT VIC AB 2-0 CTX 27 (SUTURE) IMPLANT
SUT VIC AB 3-0 SH 27 (SUTURE)
SUT VIC AB 3-0 SH 27X BRD (SUTURE) IMPLANT
SUT VIC AB 3-0 X1 27 (SUTURE) IMPLANT
SYR 10ML LL (SYRINGE) ×1 IMPLANT
SYR 20ML LL LF (SYRINGE) ×1 IMPLANT
SYR 30ML LL (SYRINGE) ×4 IMPLANT
SYR 3ML LL SCALE MARK (SYRINGE) ×4 IMPLANT
SYR 50ML SLIP (SYRINGE) IMPLANT
SYR CONTROL 10ML LL (SYRINGE) ×1 IMPLANT
SYSTEM SAHARA CHEST DRAIN ATS (WOUND CARE) ×4 IMPLANT
TAPE CLOTH SURG 4X10 WHT LF (GAUZE/BANDAGES/DRESSINGS) ×1 IMPLANT
TAPE PAPER 3X10 WHT MICROPORE (GAUZE/BANDAGES/DRESSINGS) ×1 IMPLANT
TOWEL GREEN STERILE (TOWEL DISPOSABLE) ×4 IMPLANT
TOWEL GREEN STERILE FF (TOWEL DISPOSABLE) ×4 IMPLANT
TRAY FOLEY SLVR 16FR TEMP STAT (SET/KITS/TRAYS/PACK) ×4 IMPLANT
TUBING ART PRESS 48 MALE/FEM (TUBING) ×2 IMPLANT
TUBING LAP HI FLOW INSUFFLATIO (TUBING) ×3 IMPLANT
UNDERPAD 30X30 (UNDERPADS AND DIAPERS) ×5 IMPLANT
WATER STERILE IRR 1000ML POUR (IV SOLUTION) ×8 IMPLANT
WATER STERILE IRR 1000ML UROMA (IV SOLUTION) IMPLANT

## 2019-04-01 NOTE — Anesthesia Procedure Notes (Signed)
Central Venous Catheter Insertion Performed by: Kipp Brood, MD, anesthesiologist Start/End3/12/2019 7:40 AM, 04/01/2019 7:50 AM Patient location: Pre-op. Preanesthetic checklist: patient identified, IV checked, site marked, risks and benefits discussed, surgical consent, monitors and equipment checked, pre-op evaluation, timeout performed and anesthesia consent Hand hygiene performed  and maximum sterile barriers used  PA cath was placed.Swan type:thermodilution Procedure performed without using ultrasound guided technique. Attempts: 1 Following insertion, line sutured, dressing applied and Biopatch. Post procedure assessment: blood return through all ports and free fluid flow  Patient tolerated the procedure well with no immediate complications.

## 2019-04-01 NOTE — Anesthesia Procedure Notes (Signed)
Arterial Line Insertion Start/End3/12/2019 7:05 AM Performed by: Audie Pinto, CRNA, CRNA  Patient location: Pre-op. Lidocaine 1% used for infiltration and patient sedated Right, radial was placed Catheter size: 20 G Hand hygiene performed  and maximum sterile barriers used  Allen's test indicative of satisfactory collateral circulation Attempts: 1 Procedure performed without using ultrasound guided technique. Following insertion, Biopatch and dressing applied. Post procedure assessment: normal  Additional procedure comments: Right radial aline per Atkins for left radial harvest for CABG.

## 2019-04-01 NOTE — Op Note (Signed)
CARDIOTHORACIC SURGERY OPERATIVE NOTE  Date of Procedure: 04/01/2019  Preoperative Diagnosis: Severe 3-vessel Coronary Artery Disease with reduced LV EF  Postoperative Diagnosis: Same  Procedure:    Coronary Artery Bypass Grafting x 5  Left Internal Mammary Artery to Distal Left Anterior Descending Coronary Artery Pedicled RIMA Posterior Descending Coronary Artery; left radial artery  to 1st and 2nd Obtuse Marginal Branch of Left Circumflex Coronary Artery and to 1st Diagonal Branch Coronary Artery as s sequenced graft. Open left radial artery harvesting Completion graft surveillance with indocyanine green fluorescence imaging (SPY) Multilevel rib block with Exparel solution Rigid sternal reconstruction with linear plating system  Surgeon: B. Lorayne Marek, MD  Assistant: Raford Pitcher, E PA-C Anesthesia: get  Operative Findings:  reduced left ventricular systolic function  good quality  internal mammary artery conduits  Good quality radial conduit  good quality target vessels for grafting    BRIEF CLINICAL NOTE AND INDICATIONS FOR SURGERY  64 yo man has had progressive signs/sx of heart failure for past several weeks. He was admitted this week for decompensated HF and LHC showed severe multivessel CAD. He is taken to the OR for CABG.    DETAILS OF THE OPERATIVE PROCEDURE  Preparation:  The patient is brought to the operating room on the above mentioned date and central monitoring was established by the anesthesia team including placement of Swan-Ganz catheter and radial arterial line. The patient is placed in the supine position on the operating table.  Intravenous antibiotics are administered. General endotracheal anesthesia is induced uneventfully. A Foley catheter is placed.  Baseline transesophageal echocardiogram was performed.  Findings were notable for severely reduced LV function  The patient's chest, abdomen, both groins, left upper extremity and both lower extremities  are prepared and draped in a sterile manner. A time out procedure is performed.   Surgical Approach and Conduit Harvest:  A median sternotomy incision was performed and the left internal mammary artery is dissected from the chest wall and prepared for bypass grafting. The left internal mammary artery is notably good quality conduit. Simultaneously, the open left radial artery harvesting is performed. After removing the radial artery,  the surgical incision in the upper extremity is closed with absorbable suture and the arm is tucked at the side. Attention is turned to the right chest wall where the RIMA is mobilized in a standard fashion. Following systemic heparinization, the mammary artery grafts were transected distally noted to have excellent flow. They were both treated with papaverine. Intercostal block with exparel solution was performed on each side of the chest.    Extracorporeal Cardiopulmonary Bypass and Myocardial Protection:  The pericardium is opened. The ascending aorta is normal in appearance. The ascending aorta and the right atrium are cannulated for cardiopulmonary bypass.  Adequate heparinization is verified.     The entire pre-bypass portion of the operation was notable for stable hemodynamics.  Cardiopulmonary bypass was begun and the surface of the heart is inspected. Distal target vessels are selected for coronary artery bypass grafting. A cardioplegia cannula is placed in the ascending aorta.    The patient is allowed to cool passively to [facility] systemic temperature.  The aortic cross clamp is applied and cold blood cardioplegia is delivered initially in an antegrade fashion through the aortic root.    Iced saline slush is applied for topical hypothermia.  The initial cardioplegic arrest is rapid with early diastolic arrest.  Repeat doses of cardioplegia are administered intermittently throughout the entire cross clamp portion of the operation through  the aortic root and  through subsequently placed vein grafts in order to maintain completely flat electrocardiogram.   Coronary Artery Bypass Grafting:   The 2nd obtuse marginal branch of the left circumflex coronary artery was grafted usingthe left radial artery graft in an end-to-side fashion.  At the site of distal anastomosis the target vessel was good quality and measured approximately 1.5  mm in diameter. Next, the 1st obtuse marginal branch of the left circumflex coronary artery was grafted with the radial artery graft in a side-to-side fashion to create a sequenced formation.  At the site of distal anastomosis the target vessel was good quality and measured approximately 1.5 mm in diameter. The 1st diagonal branch of the left anterior descending coronary artery was grafted using a reversed saphenous vein graft in an side-to-side fashion, creating another sequenced touch down.  At the site of distal anastomosis the target vessel was good quality and measured approximately 1.5 mm in diameter. Anastomotic patency and runoff of these three distal anastomoses was confirmed with indocyanine green fluorescence imaging (SPY).  The distal left anterior coronary artery was grafted with the left internal mammary artery in an end-to-side fashion.  At the site of distal anastomosis the target vessel was good quality and measured approximately 1.5 mm in diameter.Anastomotic patency and runoff was confirmed with indocyanine green fluorescence imaging (SPY).  The posterior descending branch of the right coronary artery was grafted using the pedicled RIMA graft in an end-to-side fashion.  At the site of distal anastomosis the target vessel was good quality and measured approximately 1.5 mm in diameter.Anastomotic patency and runoff was confirmed with indocyanine green fluorescence imaging (SPY).  The anastomosis pf the radial graft was placedto the proximal LIMA graft as a t-configuration prior to removal of the aortic cross clamp.   Deairing procedures were performed and the aortic cross clamp was removed.    Procedure Completion:  All proximal and distal coronary anastomoses were inspected for hemostasis and appropriate graft orientation. Epicardial pacing wires are fixed to the right ventricular outflow tract and to the right atrial appendage. The patient is rewarmed to 37C temperature. The patient is weaned and disconnected from cardiopulmonary bypass.  The patient's rhythm at separation from bypass was sinus bradycardia.  The patient was weaned from cardiopulmonary bypass with moderate inotropic support.   Followup transesophageal echocardiogram performed after separation from bypass revealed no changes from the preoperative exam.  The aortic and venous cannula were removed uneventfully. Protamine was administered to reverse the anticoagulation. The mediastinum and pleural space were inspected for hemostasis and irrigated with saline solution. The mediastinum and bilatearl pleural spaces were drained using fluted chest tubes placed through separate stab incisions inferiorly.  The soft tissues anterior to the aorta were reapproximated loosely. The sternum is closed in a rigid sternal fashion with linear plating system and with double strength sternal wire. The soft tissues anterior to the sternum were closed in multiple layers and the skin is closed with a running subcuticular skin closure.  The post-bypass portion of the operation was notable for stable rhythm and hemodynamics.  No blood products were administered during the operation.   Disposition:  The patient tolerated the procedure well and is transported to the surgical intensive care in stable condition. There are no intraoperative complications. All sponge instrument and needle counts are verified correct at completion of the operation.    Jayme Cloud, MD 04/01/2019 8:37 PM

## 2019-04-01 NOTE — Anesthesia Procedure Notes (Signed)
Central Venous Catheter Insertion Performed by: Kipp Brood, MD, anesthesiologist Start/End3/12/2019 7:40 AM, 04/01/2019 7:50 AM Patient location: Pre-op. Preanesthetic checklist: patient identified, IV checked, site marked, risks and benefits discussed, surgical consent, monitors and equipment checked, pre-op evaluation, timeout performed and anesthesia consent Lidocaine 1% used for infiltration and patient sedated Hand hygiene performed  and maximum sterile barriers used  Catheter size: 9 Fr Sheath introducer Procedure performed using ultrasound guided technique. Ultrasound Notes:anatomy identified, needle tip was noted to be adjacent to the nerve/plexus identified, no ultrasound evidence of intravascular and/or intraneural injection and image(s) printed for medical record Attempts: 1 Following insertion, line sutured and dressing applied. Post procedure assessment: blood return through all ports, free fluid flow and no air  Patient tolerated the procedure well with no immediate complications.

## 2019-04-01 NOTE — Procedures (Signed)
Extubation Procedure Note  Patient Details:   Name: Curtis [name] DOB: 1955/11/03 MRN: [id number]   Airway Documentation:  Airway 8 mm (Active)  Secured at (cm) 20 cm 04/01/19 2000  Measured From Lips 04/01/19 2000  Secured Location Right 04/01/19 2000  Secured By Pink Tape 04/01/19 2000  Site Condition Dry 04/01/19 2000   Vent end date: (not recorded) Vent end time: (not recorded)   Evaluation  O2 sats: stable throughout Complications: No apparent complications Patient did tolerate procedure well. Bilateral Breath Sounds: Diminished, Rhonchi   Yes   Weaning mechanics done prior to extubation NIF -26, VC 1.2 L  And with great pt effort. Pt voice hoarse after extubation and placed on 4L NCAN. Pt tolerating well. RT to cont to monitor.   Alexia Freestone F 04/01/2019, 10:00 PM

## 2019-04-01 NOTE — H&P (Signed)
History and Physical Interval Note:  04/01/2019 6:48 AM  Cherre Robins  has presented today for surgery, with the diagnosis of CAD.  The various methods of treatment have been discussed with the patient and family. After consideration of risks, benefits and other options for treatment, the patient has consented to  Procedure(s) with comments: CORONARY ARTERY BYPASS GRAFTING (CABG) (N/A) - BILATERAL IMA RADIAL ARTERY HARVEST (Bilateral) TRANSESOPHAGEAL ECHOCARDIOGRAM (TEE) (N/A) INDOCYANINE GREEN FLUORESCENCE IMAGING (ICG) (N/A) as a surgical intervention.  The patient's history has been reviewed, patient examined, no change in status, stable for surgery.  I have reviewed the patient's chart and labs.  Questions were answered to the patient's satisfaction.     Linden Dolin

## 2019-04-01 NOTE — Anesthesia Preprocedure Evaluation (Signed)
Anesthesia Evaluation  Patient identified by MRN, date of birth, ID band Patient awake    Reviewed: Allergy & Precautions, NPO status , Patient's Chart, lab work & pertinent test results  Airway Mallampati: II  TM Distance: >3 FB Neck ROM: Full    Dental  (+) Edentulous Upper, Edentulous Lower   Pulmonary Patient abstained from smoking., former smoker,    breath sounds clear to auscultation       Cardiovascular hypertension,  Rhythm:Regular Rate:Normal     Neuro/Psych    GI/Hepatic   Endo/Other  diabetes  Renal/GU      Musculoskeletal   Abdominal   Peds  Hematology   Anesthesia Other Findings   Reproductive/Obstetrics                             Anesthesia Physical Anesthesia Plan  ASA: III  Anesthesia Plan: General   Post-op Pain Management:    Induction: Intravenous  PONV Risk Score and Plan: Ondansetron  Airway Management Planned:   Additional Equipment: Arterial line, PA Cath, 3D TEE and Ultrasound Guidance Line Placement  Intra-op Plan:   Post-operative Plan: Post-operative intubation/ventilation  Informed Consent: I have reviewed the patients History and Physical, chart, labs and discussed the procedure including the risks, benefits and alternatives for the proposed anesthesia with the patient or authorized representative who has indicated his/her understanding and acceptance.       Plan Discussed with: CRNA and Anesthesiologist  Anesthesia Plan Comments:         Anesthesia Quick Evaluation

## 2019-04-01 NOTE — Discharge Summary (Addendum)
Physician Discharge Summary  Patient ID: TORIEN RAMROOP MRN: [id number] DOB/AGE: 07-10-1955 64 y.o.  Admit date: 03/28/2019 Discharge date: 04/07/2019  Admission Diagnoses:  Patient Active Problem List   Diagnosis Date Noted  . Ischemic cardiomyopathy 03/28/2019  . 3-vessel coronary artery disease 03/28/2019  . HFrEF (heart failure with reduced ejection fraction) (HCC) 03/28/2019  . Anemia 03/28/2019  . AKI (acute kidney injury) (HCC) 03/28/2019  . Acute systolic (congestive) heart failure (HCC) 03/28/2019  . Acute CHF (congestive heart failure) (HCC) 03/25/2019  . NSTEMI (non-ST elevated myocardial infarction) (HCC) 03/25/2019  . Wheelchair bound 03/25/2019  . Hyperlipidemia LDL goal <70 12/27/2018  . Type 2 diabetes mellitus with complication, without long-term current use of insulin (HCC) 08/27/2018  . Microalbuminuria 08/27/2018  . Dyslipidemia 08/27/2018  . Chronic pain of lower extremity 01/23/2015  . History of kidney stones 01/23/2015  . ED (erectile dysfunction) 01/23/2015   Discharge Diagnoses:  Patient Active Problem List   Diagnosis Date Noted  . S/P CABG x 5 04/01/2019  . Ischemic cardiomyopathy 03/28/2019  . 3-vessel coronary artery disease 03/28/2019  . HFrEF (heart failure with reduced ejection fraction) (HCC) 03/28/2019  . Anemia 03/28/2019  . AKI (acute kidney injury) (HCC) 03/28/2019  . Acute systolic (congestive) heart failure (HCC) 03/28/2019  . Acute CHF (congestive heart failure) (HCC) 03/25/2019  . NSTEMI (non-ST elevated myocardial infarction) (HCC) 03/25/2019  . Wheelchair bound 03/25/2019  . Hyperlipidemia LDL goal <70 12/27/2018  . Type 2 diabetes mellitus with complication, without long-term current use of insulin (HCC) 08/27/2018  . Microalbuminuria 08/27/2018  . Dyslipidemia 08/27/2018  . Chronic pain of lower extremity 01/23/2015  . History of kidney stones 01/23/2015  . ED (erectile dysfunction) 01/23/2015   Discharged Condition:  good  History of Present Illness:  Mr.  Kilker is a 64 yo white male with DM with a work related injury with residual right sided deficits.  Due to his work related injury the patient is chair bound due to immobility of his RLE.  He began noticing increasing edema.  This progressed up to his knees prompting him to be evaluated in [place].  Workup showed the patient to be in CHF.  He also underwent cardiac catheterization which showed multivessel CAD with with reduced EF.  He was started on a Milrinone drip and transferred to [facility] for further care.   Hospital Course:   The patient was aggressively diuresed.  He was feeling much better and was now able to lie flat without difficulty.  He was medically treated by the Advanced heart failure team.  It was felt he would benefit from coronary bypass procedure and TCTS consult was requested.  He was evaluated by Dr. Vickey Sages who felt the patient would benefit from coronary bypass grafting.  The risks and benefits of the procedure were explained to the patient and he was agreeable to proceed.  He was taken to the operating room on 04/01/2019.  He underwent CABG x 5 utilizing LIMA to LAD, RIMA to PLVB, and sequential radial artery to OM1, OM2, and Diagonal.  He also underwent open harvest of left radial artery.  He tolerated the procedure and was taken to the SICU in stable condition. His lines and tubes were removed. We began the mobilize the patient. Heart failure was consulted for assistance.  Low-dose carvedilol was started but then discontinued for unclear reasons.  POD 3 he was placed back on milrinone for a low co-ox. His foley was removed POD 4 and PT/OT was  ordered for resources for home since the patient is wheelchair bound. We continued to wean oxygen as tolerated. He was transferred to the stepdown unit for continued care. He was weaned off milrinone.   Addendum: Heart failure started Milrinone drip 03/16. Co ox went up to 59.6 and has increased to  67.5 03/17. He is also on Digoxin 0.125 mg daily, Losartan 12.5 mg bid, Plavix and baby ec asa. Foley was removed 03/16. PT and OT were ordered. Of note, he was wheel chair bound prior to admission, although fairly independent with ADLs. He still has a Pravena vac on sternal wound, which will be removed on 03/18/201. His diabetes is under good control. His pre op HGA1C was 7.7 and he will need follow up with his medical doctor after discharge. He has been on room air. He is tolerating a diet and has had a bowel movement. All incisions are clean, dry, and continuing to heal.  Heart failure medications have been adjusted.  He is medically stable for discharge home today.   Consults: cardiology  Significant Diagnostic Studies: angiography:    Ost LM lesion is 40% stenosed.  Ost Cx to Prox Cx lesion is 90% stenosed.  Mid Cx to Dist Cx lesion is 90% stenosed.  Prox LAD to Mid LAD lesion is 85% stenosed.  2nd Diag lesion is 90% stenosed.  1st Mrg lesion is 80% stenosed.  Mid RCA lesion is 100% stenosed.   1.  Severe moderately calcified diffuse three-vessel coronary artery disease with at least moderate ostial left main stenosis. 2.  Right heart catheterization showed moderately to severely elevated filling pressures with RA pressure of 12 mmHg, PCW of 27 mmHg, PA pressure of 62/26 with a mean of 40 mmHg.  Severely reduced cardiac output at 3.63 with a cardiac index of 1.94. 3.  Left ventricular angiography was not performed due to renal dysfunction.  EF was 25 to 30% by echo.  Treatments:  CARDIOTHORACIC SURGERY OPERATIVE NOTE  Date of Procedure:    04/01/2019  Preoperative Diagnosis:      Severe 3-vessel Coronary Artery Disease with reduced LV EF  Postoperative Diagnosis:    Same  Procedure:        Coronary Artery Bypass Grafting x 5             Left Internal Mammary Artery to Distal Left Anterior Descending Coronary Artery Pedicled RIMA Posterior Descending Coronary Artery;  left radial artery  to 1st and 2nd Obtuse Marginal Branch of Left Circumflex Coronary Artery and to 1st Diagonal Branch Coronary Artery as s sequenced graft. Open left radial artery harvesting Completion graft surveillance with indocyanine green fluorescence imaging (SPY) Multilevel rib block with Exparel solution Rigid sternal reconstruction with linear plating system  Surgeon:        B. Lorayne Marek, MD  Assistant:       Raford Pitcher, E PA-C Anesthesia:    get  Operative Findings: ? reduced left ventricular systolic function ? good quality  internal mammary artery conduits ? Good quality radial conduit ? good quality target vessels for grafting   Discharge Exam: Blood pressure (!) 145/74, pulse 97, temperature 98.9 F (37.2 C), temperature source Oral, resp. rate 20, height 5\' 8"  (1.727 m), weight 72.8 kg, SpO2 94 %.   General appearance: alert, cooperative and no distress Heart: regular rate and rhythm Lungs: clear to auscultation bilaterally Abdomen: soft, non-tender; bowel sounds normal; no masses,  no organomegaly Extremities: edema trace-1+, improved Wound: pravena removed, mild fat necrosis present  at bottom of sternotomy, otherwise clean and dry surgical sites  Discharge Medications:    Discharge Instructions    Amb Referral to Cardiac Rehabilitation   Complete by: As directed    Diagnosis: CABG   CABG X ___: 5   After initial evaluation and assessments completed: Virtual Based Care may be provided alone or in conjunction with Phase 2 Cardiac Rehab based on patient barriers.: Yes     Allergies as of 04/07/2019      Reactions   Morphine And Related    UNSPECIFIED REACTION    Codeine Nausea And Vomiting      Medication List    STOP taking these medications   HYDROcodone-acetaminophen 5-325 MG tablet Commonly known as: NORCO/VICODIN   insulin aspart 100 UNIT/ML injection Commonly known as: novoLOG   milrinone 20 MG/100 ML Soln infusion Commonly known as:  PRIMACOR   nitroGLYCERIN 0.4 MG SL tablet Commonly known as: NITROSTAT     TAKE these medications   acetaminophen 500 MG tablet Commonly known as: TYLENOL Take 2 tablets (1,000 mg total) by mouth every 6 (six) hours.   aspirin 81 MG chewable tablet Chew 1 tablet (81 mg total) by mouth daily.   atorvastatin 40 MG tablet Commonly known as: LIPITOR Take 1 tablet (40 mg total) by mouth daily at 6 PM. What changed:   medication strength  how much to take  when to take this   clopidogrel 75 MG tablet Commonly known as: PLAVIX Take 1 tablet (75 mg total) by mouth daily.   digoxin 0.125 MG tablet Commonly known as: LANOXIN Take 1 tablet (0.125 mg total) by mouth daily.   furosemide 20 MG tablet Commonly known as: LASIX Take 1 tablet (20 mg total) by mouth daily. Start taking on: April 08, 2019   isosorbide mononitrate 30 MG 24 hr tablet Commonly known as: IMDUR Take 1 tablet (30 mg total) by mouth daily.   Lancet Devices Misc Use to check blood sugar twice a day   oxyCODONE 5 MG immediate release tablet Commonly known as: Oxy IR/ROXICODONE Take 1 tablet (5 mg total) by mouth every 6 (six) hours as needed for severe pain.   sacubitril-valsartan 49-51 MG Commonly known as: ENTRESTO Take 1 tablet by mouth 2 (two) times daily.   spironolactone 25 MG tablet Commonly known as: ALDACTONE Take 1 tablet (25 mg total) by mouth daily. Start taking on: April 08, 2019      Follow-up Information    Birdie Sons, MD. Go on 05/24/2019.   Specialty: Family Medicine Why: for a follow up appointment regarding further diabetes management and surveillance of HGA1C 7.7. Appointment time is at 10:20 am Contact information: [address] [place] [address] [phone]        Wonda Olds, MD. Go on 04/18/2019.   Specialty: Cardiothoracic Surgery Why: Appointment is at 12:00, please get CXR at 11:30 at [facility] on first floor of our office  building Contact information: [address] [place] [address] [phone]        [facility] Follow up on 04/21/2019.   Specialty: Cardiology Why: at 1:30  Contact information: [address] [id number] [place] [phone]         The patient has been discharged on:   1.Beta Blocker:  Yes [   ]  No   [ no  ]                              If No, reason: low cardiac output  2.Ace Inhibitor/ARB: Yes [ yes  ]                                     No  [    ]                                     If No, reason:  3.Statin:   Yes [  yes ]                  No  [   ]                  If No, reason:  4.Ecasa:  Yes  [ yes  ]                  No   [   ]                  If No, reason:  Signed: Daysia Vandenboom PA-C 04/07/2019, 3:41 PM

## 2019-04-01 NOTE — Brief Op Note (Signed)
03/28/2019 - 04/01/2019  1:28 PM  PATIENT:  Curtis [name]  64 y.o. male  PRE-OPERATIVE DIAGNOSIS:  CAD  POST-OPERATIVE DIAGNOSIS:  CAD  PROCEDURE:  Procedure(s) with comments:  CORONARY ARTERY BYPASS GRAFTING x 5 -LIMA to LAD -RIMA to PLVB -SEQUENTIAL RADIAL ARTERY to OM1, OM2, and DIAGONAL   RADIAL ARTERY HARVEST (OPEN HARVEST)  -left  TRANSESOPHAGEAL ECHOCARDIOGRAM (TEE) (N/A)  INDOCYANINE GREEN FLUORESCENCE IMAGING (ICG) (N/A)  SURGEON:  Surgeon(s) and Role:    * Linden Dolin, MD - Primary  PHYSICIAN ASSISTANT: Lowella Dandy PA-C  ANESTHESIA:   general  EBL:  413 mL   BLOOD ADMINISTERED: CELLSAVER  DRAINS: Left and Right Pleural Chest Tubes, Mediastinal Drains   LOCAL MEDICATIONS USED:  MARCAINE     SPECIMEN:  No Specimen  DISPOSITION OF SPECIMEN:  N/A  COUNTS:  YES  TOURNIQUET:  * No tourniquets in log *  DICTATION: .Dragon Dictation  PLAN OF CARE: Admit to inpatient   PATIENT DISPOSITION:  ICU - intubated and hemodynamically stable.   Delay start of Pharmacological VTE agent (>24hrs) due to surgical blood loss or risk of bleeding: yes

## 2019-04-01 NOTE — Anesthesia Procedure Notes (Signed)
Procedure Name: Intubation Date/Time: 04/01/2019 7:56 AM Performed by: Janene Harvey, CRNA Pre-anesthesia Checklist: Patient identified, Emergency Drugs available, Suction available and Patient being monitored Patient Re-evaluated:Patient Re-evaluated prior to induction Oxygen Delivery Method: Circle system utilized Preoxygenation: Pre-oxygenation with 100% oxygen Induction Type: IV induction Ventilation: Mask ventilation without difficulty Laryngoscope Size: Mac and 4 Grade View: Grade I Tube type: Oral Tube size: 8.0 mm Number of attempts: 1 Airway Equipment and Method: Stylet and Oral airway Placement Confirmation: ETT inserted through vocal cords under direct vision,  positive ETCO2 and breath sounds checked- equal and bilateral Secured at: 22 cm Tube secured with: Tape Dental Injury: Teeth and Oropharynx as per pre-operative assessment

## 2019-04-01 NOTE — Transfer of Care (Signed)
Immediate Anesthesia Transfer of Care Note  Patient: Curtis [name]  Procedure(s) Performed: CORONARY ARTERY BYPASS GRAFTING (CABG) X 5 USING BILATERAL IMA  AND LEFT RADIAL ARTERY (N/A Chest) RADIAL ARTERY HARVEST (OPEN HARVEST) (Left Arm Lower) TRANSESOPHAGEAL ECHOCARDIOGRAM (TEE) (N/A ) INDOCYANINE GREEN FLUORESCENCE IMAGING (ICG) (N/A )  Patient Location: ICU  Anesthesia Type:General  Level of Consciousness: Patient remains intubated per anesthesia plan  Airway & Oxygen Therapy: Patient placed on Ventilator (see vital sign flow sheet for setting)  Post-op Assessment: Report given to RN and Post -op Vital signs reviewed and stable  Post vital signs: Reviewed  Last Vitals:  Vitals Value Taken Time  BP 175/67 04/01/19 1345  Temp    Pulse 83 04/01/19 1345  Resp 12 04/01/19 1345  SpO2 99 % 04/01/19 1345    Last Pain:  Vitals:   04/01/19 0355  TempSrc: Oral  PainSc: 0-No pain      Patients Stated Pain Goal: 3 (03/29/19 1700)  Complications: No apparent anesthesia complications

## 2019-04-02 ENCOUNTER — Inpatient Hospital Stay (HOSPITAL_COMMUNITY): Payer: Self-pay

## 2019-04-02 LAB — GLUCOSE, CAPILLARY
Glucose-Capillary: 125 mg/dL — ABNORMAL HIGH (ref 70–99)
Glucose-Capillary: 129 mg/dL — ABNORMAL HIGH (ref 70–99)
Glucose-Capillary: 139 mg/dL — ABNORMAL HIGH (ref 70–99)
Glucose-Capillary: 150 mg/dL — ABNORMAL HIGH (ref 70–99)
Glucose-Capillary: 153 mg/dL — ABNORMAL HIGH (ref 70–99)
Glucose-Capillary: 157 mg/dL — ABNORMAL HIGH (ref 70–99)
Glucose-Capillary: 161 mg/dL — ABNORMAL HIGH (ref 70–99)
Glucose-Capillary: 165 mg/dL — ABNORMAL HIGH (ref 70–99)
Glucose-Capillary: 169 mg/dL — ABNORMAL HIGH (ref 70–99)
Glucose-Capillary: 181 mg/dL — ABNORMAL HIGH (ref 70–99)
Glucose-Capillary: 182 mg/dL — ABNORMAL HIGH (ref 70–99)
Glucose-Capillary: 70 mg/dL (ref 70–99)
Glucose-Capillary: 79 mg/dL (ref 70–99)
Glucose-Capillary: 95 mg/dL (ref 70–99)

## 2019-04-02 LAB — COOXEMETRY PANEL
Carboxyhemoglobin: 1.5 % (ref 0.5–1.5)
Carboxyhemoglobin: 1.4 % (ref 0.5–1.5)
Methemoglobin: 1.5 % (ref 0.0–1.5)
Methemoglobin: 1.3 % (ref 0.0–1.5)
O2 Saturation: 95.6 %
O2 Saturation: 61.2 %
Total hemoglobin: 9.8 g/dL — ABNORMAL LOW (ref 12.0–16.0)
Total hemoglobin: 9.5 g/dL — ABNORMAL LOW (ref 12.0–16.0)

## 2019-04-02 LAB — POCT I-STAT 7, (LYTES, BLD GAS, ICA,H+H)
Acid-Base Excess: 2 mmol/L (ref 0.0–2.0)
Acid-Base Excess: 1 mmol/L (ref 0.0–2.0)
Bicarbonate: 28.3 mmol/L — ABNORMAL HIGH (ref 20.0–28.0)
Bicarbonate: 25.1 mmol/L (ref 20.0–28.0)
Bicarbonate: 25.7 mmol/L (ref 20.0–28.0)
Calcium, Ion: 1.26 mmol/L (ref 1.15–1.40)
Calcium, Ion: 1.16 mmol/L (ref 1.15–1.40)
Calcium, Ion: 1.16 mmol/L (ref 1.15–1.40)
HCT: 32 % — ABNORMAL LOW (ref 39.0–52.0)
HCT: 28 % — ABNORMAL LOW (ref 39.0–52.0)
HCT: 29 % — ABNORMAL LOW (ref 39.0–52.0)
Hemoglobin: 10.9 g/dL — ABNORMAL LOW (ref 13.0–17.0)
Hemoglobin: 9.5 g/dL — ABNORMAL LOW (ref 13.0–17.0)
Hemoglobin: 9.9 g/dL — ABNORMAL LOW (ref 13.0–17.0)
O2 Saturation: 99 %
O2 Saturation: 99 %
O2 Saturation: 100 %
Patient temperature: 35.8
Potassium: 4.2 mmol/L (ref 3.5–5.1)
Potassium: 5.3 mmol/L — ABNORMAL HIGH (ref 3.5–5.1)
Potassium: 5.4 mmol/L — ABNORMAL HIGH (ref 3.5–5.1)
Sodium: 141 mmol/L (ref 135–145)
Sodium: 139 mmol/L (ref 135–145)
Sodium: 139 mmol/L (ref 135–145)
TCO2: 30 mmol/L (ref 22–32)
TCO2: 26 mmol/L (ref 22–32)
TCO2: 27 mmol/L (ref 22–32)
pCO2 arterial: 48.4 mmHg — ABNORMAL HIGH (ref 32.0–48.0)
pCO2 arterial: 41.3 mmHg (ref 32.0–48.0)
pCO2 arterial: 40.9 mmHg (ref 32.0–48.0)
pH, Arterial: 7.369 (ref 7.350–7.450)
pH, Arterial: 7.391 (ref 7.350–7.450)
pH, Arterial: 7.406 (ref 7.350–7.450)
pO2, Arterial: 139 mmHg — ABNORMAL HIGH (ref 83.0–108.0)
pO2, Arterial: 157 mmHg — ABNORMAL HIGH (ref 83.0–108.0)
pO2, Arterial: 174 mmHg — ABNORMAL HIGH (ref 83.0–108.0)

## 2019-04-02 LAB — BASIC METABOLIC PANEL
Anion gap: 10 (ref 5–15)
Anion gap: 11 (ref 5–15)
BUN: 13 mg/dL (ref 8–23)
BUN: 16 mg/dL (ref 8–23)
CO2: 23 mmol/L (ref 22–32)
CO2: 20 mmol/L — ABNORMAL LOW (ref 22–32)
Calcium: 8.1 mg/dL — ABNORMAL LOW (ref 8.9–10.3)
Calcium: 8.4 mg/dL — ABNORMAL LOW (ref 8.9–10.3)
Chloride: 106 mmol/L (ref 98–111)
Chloride: 105 mmol/L (ref 98–111)
Creatinine, Ser: 1.08 mg/dL (ref 0.61–1.24)
Creatinine, Ser: 1.36 mg/dL — ABNORMAL HIGH (ref 0.61–1.24)
GFR calc Af Amer: >60 mL/min (ref >60)
GFR calc Af Amer: >60 mL/min (ref >60)
GFR calc non Af Amer: >60 mL/min (ref >60)
GFR calc non Af Amer: 55 mL/min — ABNORMAL LOW (ref >60)
Glucose, Bld: 178 mg/dL — ABNORMAL HIGH (ref 70–99)
Glucose, Bld: 94 mg/dL (ref 70–99)
Potassium: 5.1 mmol/L (ref 3.5–5.1)
Potassium: 4.7 mmol/L (ref 3.5–5.1)
Sodium: 139 mmol/L (ref 135–145)
Sodium: 136 mmol/L (ref 135–145)

## 2019-04-02 LAB — MAGNESIUM
Magnesium: 2.6 mg/dL — ABNORMAL HIGH (ref 1.7–2.4)
Magnesium: 2.6 mg/dL — ABNORMAL HIGH (ref 1.7–2.4)

## 2019-04-02 LAB — CBC
HCT: 29.8 % — ABNORMAL LOW (ref 39.0–52.0)
HCT: 30.5 % — ABNORMAL LOW (ref 39.0–52.0)
Hemoglobin: 9.2 g/dL — ABNORMAL LOW (ref 13.0–17.0)
Hemoglobin: 9.6 g/dL — ABNORMAL LOW (ref 13.0–17.0)
MCH: 28 pg (ref 26.0–34.0)
MCH: 28.2 pg (ref 26.0–34.0)
MCHC: 30.9 g/dL (ref 30.0–36.0)
MCHC: 31.5 g/dL (ref 30.0–36.0)
MCV: 90.9 fL (ref 80.0–100.0)
MCV: 89.7 fL (ref 80.0–100.0)
Platelets: 139 10*3/uL — ABNORMAL LOW (ref 150–400)
Platelets: 137 10*3/uL — ABNORMAL LOW (ref 150–400)
RBC: 3.28 MIL/uL — ABNORMAL LOW (ref 4.22–5.81)
RBC: 3.4 MIL/uL — ABNORMAL LOW (ref 4.22–5.81)
RDW: 14 % (ref 11.5–15.5)
RDW: 14.4 % (ref 11.5–15.5)
WBC: 12.7 10*3/uL — ABNORMAL HIGH (ref 4.0–10.5)
WBC: 14.2 10*3/uL — ABNORMAL HIGH (ref 4.0–10.5)
nRBC: 0 % (ref 0.0–0.2)
nRBC: 0 % (ref 0.0–0.2)

## 2019-04-02 MED ORDER — ISOSORBIDE DINITRATE 10 MG PO TABS
10.0000 mg | ORAL_TABLET | Freq: Three times a day (TID) | ORAL | Status: AC
  Administered 2019-04-02 – 2019-04-05 (×12): 10 mg via ORAL
  Filled 2019-04-02 (×12): qty 1

## 2019-04-02 MED ORDER — ORAL CARE MOUTH RINSE
15.0000 mL | Freq: Two times a day (BID) | OROMUCOSAL | Status: DC
  Administered 2019-04-02 – 2019-04-07 (×10): 15 mL via OROMUCOSAL

## 2019-04-02 MED ORDER — DIGOXIN 125 MCG PO TABS
0.1250 mg | ORAL_TABLET | Freq: Every day | ORAL | Status: DC
  Administered 2019-04-02 – 2019-04-07 (×6): 0.125 mg via ORAL
  Filled 2019-04-02 (×6): qty 1

## 2019-04-02 MED ORDER — ASPIRIN 81 MG PO CHEW
81.0000 mg | CHEWABLE_TABLET | Freq: Every day | ORAL | Status: DC
  Administered 2019-04-02 – 2019-04-07 (×6): 81 mg via ORAL
  Filled 2019-04-02 (×6): qty 1

## 2019-04-02 MED ORDER — CLOPIDOGREL BISULFATE 75 MG PO TABS
75.0000 mg | ORAL_TABLET | Freq: Every day | ORAL | Status: DC
  Administered 2019-04-02 – 2019-04-07 (×6): 75 mg via ORAL
  Filled 2019-04-02 (×6): qty 1
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
## 2019-04-02 NOTE — Anesthesia Postprocedure Evaluation (Signed)
Anesthesia Post Note  Patient: Curtis [name]  Procedure(s) Performed: CORONARY ARTERY BYPASS GRAFTING (CABG) X 5 USING BILATERAL IMA  AND LEFT RADIAL ARTERY (N/A Chest) RADIAL ARTERY HARVEST (OPEN HARVEST) (Left Arm Lower) TRANSESOPHAGEAL ECHOCARDIOGRAM (TEE) (N/A ) INDOCYANINE GREEN FLUORESCENCE IMAGING (ICG) (N/A )     Patient location during evaluation: SICU Anesthesia Type: General Level of consciousness: sedated and patient remains intubated per anesthesia plan Pain management: pain level controlled Vital Signs Assessment: post-procedure vital signs reviewed and stable Respiratory status: patient remains intubated per anesthesia plan and patient on ventilator - see flowsheet for VS Cardiovascular status: stable Anesthetic complications: no    Last Vitals:  Vitals:   04/02/19 1500 04/02/19 1600  BP:    Pulse: 82 85  Resp: (!) 21 19  Temp:    SpO2: 98% 99%    Last Pain:  Vitals:   04/02/19 1600  TempSrc:   PainSc: 0-No pain                 Nakaila Freeze COKER

## 2019-04-02 NOTE — Progress Notes (Signed)
1 Day Post-Op Procedure(s) (LRB): CORONARY ARTERY BYPASS GRAFTING (CABG) X 5 USING BILATERAL IMA  AND LEFT RADIAL ARTERY (N/A) RADIAL ARTERY HARVEST (OPEN HARVEST) (Left) TRANSESOPHAGEAL ECHOCARDIOGRAM (TEE) (N/A) INDOCYANINE GREEN FLUORESCENCE IMAGING (ICG) (N/A) Subjective: No complaints Objective: Vital signs in last 24 hours: Temp:  [96.6 F (35.9 C)-99.9 F (37.7 C)] 98.4 F (36.9 C) (03/13 1200) Pulse Rate:  [84-112] 84 (03/13 1200) Cardiac Rhythm: Atrial paced (03/13 0800) Resp:  [13-34] 31 (03/13 1200) BP: (106-166)/(57-89) 130/65 (03/13 0700) SpO2:  [94 %-100 %] 96 % (03/13 1200) Arterial Line BP: (99-193)/(37-97) 127/56 (03/13 1200) FiO2 (%):  [40 %-50 %] 40 % (03/12 2120) Weight:  [74.5 kg] 74.5 kg (03/13 0500)  Hemodynamic parameters for last 24 hours: PAP: (22-47)/(7-26) 47/26 CVP:  [8 mmHg-15 mmHg] 8 mmHg CO:  [3.7 L/min-5.7 L/min] 5.5 L/min CI:  [2 L/min/m2-3.1 L/min/m2] 3 L/min/m2  Intake/Output from previous day: 03/12 0701 - 03/13 0700 In: 6425.1 [I.V.:3492.4; Blood:275; IV Piggyback:2657.8] Out: 5923 [Urine:5290; Blood:413; Chest Tube:220] Intake/Output this shift: Total I/O In: 984.4 [I.V.:984.4] Out: 345 [Urine:305; Chest Tube:40]  General appearance: confused Neurologic: intact Heart: regular rate and rhythm, S1, S2 normal, no murmur, click, rub or gallop Lungs: clear to auscultation bilaterally Abdomen: soft, non-tender; bowel sounds normal; no masses,  no organomegaly Extremities: extremities normal, atraumatic, no cyanosis or edema Wound: dressed, dry  Lab Results: Recent Labs    04/01/19 1953 04/02/19 0312  WBC 13.3* 12.7*  HGB 10.0* 9.2*  HCT 31.9* 29.8*  PLT 154 139*   BMET:  Recent Labs    04/01/19 1953 04/02/19 0312  NA 138 139  K 5.7* 5.1  CL 108 106  CO2 22 23  GLUCOSE 148* 178*  BUN 12 13  CREATININE 0.94 1.08  CALCIUM 8.3* 8.1*    PT/INR:  Recent Labs    04/01/19 1414  LABPROT 16.9*  INR 1.4*   ABG     Component Value Date/Time   PHART 7.380 04/01/2019 1248   HCO3 28.5 (H) 04/01/2019 1248   TCO2 29 04/01/2019 1252   O2SAT 61.2 04/02/2019 1315   CBG (last 3)  Recent Labs    04/02/19 1228 04/02/19 1325 04/02/19 1441  GLUCAP 150* 139* 153*    Assessment/Plan: S/P Procedure(s) (LRB): CORONARY ARTERY BYPASS GRAFTING (CABG) X 5 USING BILATERAL IMA  AND LEFT RADIAL ARTERY (N/A) RADIAL ARTERY HARVEST (OPEN HARVEST) (Left) TRANSESOPHAGEAL ECHOCARDIOGRAM (TEE) (N/A) INDOCYANINE GREEN FLUORESCENCE IMAGING (ICG) (N/A) Mobilize oob to chair  Titrate gtts.   LOS: 5 days    Linden Dolin 04/02/2019

## 2019-04-02 NOTE — Plan of Care (Signed)
Pt is alert and oriented, on 2L [place], breathing is even and unlabored. Pt verbalizes understanding of education on plan of care and medications. Pt is able to move all extremities but reports right side baseline weakness. Pt's heart rate has maintained in the 90s. Pt's blood pressure increases significantly when pt is awake but decreases when pt is resting. Pt's neo, levo, epi, and cardene have been weaned accordingly (see MAR). Pt complains of mild pain at times. Pt is resting in bed with eyes closed, breathing is even and unlabored, call bell is within reach and bed is in lowest posititon. Problem: Education: Goal: Ability to demonstrate management of disease process will improve Outcome: Progressing   Problem: Cardiac: Goal: Ability to achieve and maintain adequate cardiopulmonary perfusion will improve Outcome: Progressing   Problem: Metabolic: Goal: Ability to maintain appropriate glucose levels will improve Outcome: Progressing

## 2019-04-03 ENCOUNTER — Inpatient Hospital Stay (HOSPITAL_COMMUNITY): Payer: Self-pay

## 2019-04-03 LAB — COOXEMETRY PANEL
Carboxyhemoglobin: 1.6 % — ABNORMAL HIGH (ref 0.5–1.5)
Carboxyhemoglobin: 1.1 % (ref 0.5–1.5)
Methemoglobin: 1.2 % (ref 0.0–1.5)
Methemoglobin: 0.7 % (ref 0.0–1.5)
O2 Saturation: 98.5 %
O2 Saturation: 55.8 %
Total hemoglobin: 9.4 g/dL — ABNORMAL LOW (ref 12.0–16.0)
Total hemoglobin: 8.9 g/dL — ABNORMAL LOW (ref 12.0–16.0)

## 2019-04-03 LAB — BASIC METABOLIC PANEL
Anion gap: 8 (ref 5–15)
BUN: 17 mg/dL (ref 8–23)
CO2: 23 mmol/L (ref 22–32)
Calcium: 8.1 mg/dL — ABNORMAL LOW (ref 8.9–10.3)
Chloride: 105 mmol/L (ref 98–111)
Creatinine, Ser: 1.17 mg/dL (ref 0.61–1.24)
GFR calc Af Amer: >60 mL/min (ref >60)
GFR calc non Af Amer: >60 mL/min (ref >60)
Glucose, Bld: 139 mg/dL — ABNORMAL HIGH (ref 70–99)
Potassium: 5.1 mmol/L (ref 3.5–5.1)
Sodium: 136 mmol/L (ref 135–145)

## 2019-04-03 LAB — CBC
HCT: 29.3 % — ABNORMAL LOW (ref 39.0–52.0)
Hemoglobin: 9.1 g/dL — ABNORMAL LOW (ref 13.0–17.0)
MCH: 28.1 pg (ref 26.0–34.0)
MCHC: 31.1 g/dL (ref 30.0–36.0)
MCV: 90.4 fL (ref 80.0–100.0)
Platelets: 133 10*3/uL — ABNORMAL LOW (ref 150–400)
RBC: 3.24 MIL/uL — ABNORMAL LOW (ref 4.22–5.81)
RDW: 14.3 % (ref 11.5–15.5)
WBC: 13.1 10*3/uL — ABNORMAL HIGH (ref 4.0–10.5)
nRBC: 0 % (ref 0.0–0.2)

## 2019-04-03 LAB — GLUCOSE, CAPILLARY
Glucose-Capillary: 105 mg/dL — ABNORMAL HIGH (ref 70–99)
Glucose-Capillary: 112 mg/dL — ABNORMAL HIGH (ref 70–99)
Glucose-Capillary: 115 mg/dL — ABNORMAL HIGH (ref 70–99)
Glucose-Capillary: 121 mg/dL — ABNORMAL HIGH (ref 70–99)
Glucose-Capillary: 129 mg/dL — ABNORMAL HIGH (ref 70–99)
Glucose-Capillary: 140 mg/dL — ABNORMAL HIGH (ref 70–99)
Glucose-Capillary: 152 mg/dL — ABNORMAL HIGH (ref 70–99)
Glucose-Capillary: 172 mg/dL — ABNORMAL HIGH (ref 70–99)
Glucose-Capillary: 176 mg/dL — ABNORMAL HIGH (ref 70–99)
Glucose-Capillary: 86 mg/dL (ref 70–99)

## 2019-04-03 MED ORDER — INSULIN DETEMIR 100 UNIT/ML ~~LOC~~ SOLN
15.0000 [IU] | Freq: Every day | SUBCUTANEOUS | Status: DC
  Administered 2019-04-03 – 2019-04-06 (×4): 15 [IU] via SUBCUTANEOUS
  Filled 2019-04-03 (×5): qty 0.15

## 2019-04-03 MED ORDER — CARVEDILOL 3.125 MG PO TABS
3.1250 mg | ORAL_TABLET | Freq: Two times a day (BID) | ORAL | Status: DC
  Administered 2019-04-03: 3.125 mg via ORAL
  Filled 2019-04-03: qty 1

## 2019-04-03 MED ORDER — PRO-STAT SUGAR FREE PO LIQD
30.0000 mL | Freq: Two times a day (BID) | ORAL | Status: DC
  Administered 2019-04-03 – 2019-04-06 (×8): 30 mL via ORAL
  Filled 2019-04-03 (×9): qty 30

## 2019-04-03 MED ORDER — FUROSEMIDE 10 MG/ML IJ SOLN
60.0000 mg | Freq: Once | INTRAMUSCULAR | Status: AC
  Administered 2019-04-03: 60 mg via INTRAVENOUS
  Filled 2019-04-03: qty 6

## 2019-04-03 MED ORDER — INSULIN ASPART 100 UNIT/ML ~~LOC~~ SOLN
0.0000 [IU] | SUBCUTANEOUS | Status: DC
  Administered 2019-04-03: 2 [IU] via SUBCUTANEOUS
  Administered 2019-04-03: 4 [IU] via SUBCUTANEOUS
  Administered 2019-04-03 – 2019-04-05 (×9): 2 [IU] via SUBCUTANEOUS
  Administered 2019-04-06: 4 [IU] via SUBCUTANEOUS
  Administered 2019-04-06 (×3): 2 [IU] via SUBCUTANEOUS
  Administered 2019-04-07: 4 [IU] via SUBCUTANEOUS
  Administered 2019-04-07: 2 [IU] via SUBCUTANEOUS

## 2019-04-03 NOTE — Progress Notes (Signed)
Coralee North RN aware of order to d/c CVC/introducer.  PICC line to stay in place.

## 2019-04-03 NOTE — Progress Notes (Signed)
Anesthesiology Follow-up:  64 year old male presented with low EF CHF with diffuse CAD, now two days S/P CABG X 5, awake and alert, neuro intact, hemodynamically stable off pressors.  VS: T-  37.1 BP- 143/48 RR- 19 HR- 95 (SR) O2 Sat- 96%  K-5.2 BUN/Cr.- 17/1.17 glucose- 139 H/H- 9.1/29.3 Platelets- 133,000  Extubated 10 hours post-op. Doing well, overall, stable post-op course.

## 2019-04-03 NOTE — Addendum Note (Signed)
Addendum  created 04/03/19 1625 by Kipp Brood, MD   Clinical Note Signed

## 2019-04-03 NOTE — Plan of Care (Signed)
Pt is alert and oriented, now on room air, breathing is even and unlabored. Pt's blood pressure has been a lot more stable throughout the night, have not needed to restart any drips. Pt continues on milrinone, insulin per MD order, and LR KVO. CBG's have been WNL. Pt's UOP been a steady 60m/hr. Pt has received pain medication a couple times throughout the night due to discomfort. Pt attempts to sit all the way up and scoot himself back in the bed, he has been reminded to use heart pillow for splinting incision. No significant bleeding noted from chest tubes. CVP has been 8-9 throughout the night. Call bell is within reach. Problem: Activity: Goal: Capacity to carry out activities will improve Outcome: Progressing   Problem: Metabolic: Goal: Ability to maintain appropriate glucose levels will improve Outcome: Completed/Met   Problem: Cardiac: Goal: Ability to achieve and maintain adequate cardiopulmonary perfusion will improve Outcome: Progressing   Problem: Tissue Perfusion: Goal: Adequacy of tissue perfusion will improve Outcome: Progressing
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
## 2019-04-03 NOTE — Progress Notes (Signed)
2 Days Post-Op Procedure(s) (LRB): CORONARY ARTERY BYPASS GRAFTING (CABG) X 5 USING BILATERAL IMA  AND LEFT RADIAL ARTERY (N/A) RADIAL ARTERY HARVEST (OPEN HARVEST) (Left) TRANSESOPHAGEAL ECHOCARDIOGRAM (TEE) (N/A) INDOCYANINE GREEN FLUORESCENCE IMAGING (ICG) (N/A) Subjective: No complaints Objective: Vital signs in last 24 hours: Temp:  [98.2 F (36.8 C)-99 F (37.2 C)] 98.8 F (37.1 C) (03/14 0800) Pulse Rate:  [80-96] 91 (03/14 0700) Cardiac Rhythm: (P) Normal sinus rhythm (03/14 0800) Resp:  [14-31] 16 (03/14 0700) SpO2:  [89 %-100 %] 95 % (03/14 0700) Arterial Line BP: (99-175)/(36-66) 170/55 (03/14 0700) Weight:  [77.7 kg] 77.7 kg (03/14 0600)  Hemodynamic parameters for last 24 hours: PAP: (22-47)/(7-26) 47/26 CVP:  [6 mmHg-41 mmHg] 7 mmHg  Intake/Output from previous day: 03/13 0701 - 03/14 0700 In: 2213.7 [P.O.:540; I.V.:1673.7] Out: 1250 [Urine:880; Chest Tube:370] Intake/Output this shift: No intake/output data recorded.  General appearance: alert and cooperative Neurologic: intact Heart: regular rate and rhythm, S1, S2 normal, no murmur, click, rub or gallop Lungs: clear to auscultation bilaterally Abdomen: soft, non-tender; bowel sounds normal; no masses,  no organomegaly Extremities: edema 1+ Wound: dressed with Prevena  Lab Results: Recent Labs    04/02/19 1738 04/03/19 0341  WBC 14.2* 13.1*  HGB 9.6* 9.1*  HCT 30.5* 29.3*  PLT 137* 133*   BMET:  Recent Labs    04/02/19 1738 04/03/19 0341  NA 136 136  K 4.7 5.1  CL 105 105  CO2 20* 23  GLUCOSE 94 139*  BUN 16 17  CREATININE 1.36* 1.17  CALCIUM 8.4* 8.1*    PT/INR:  Recent Labs    04/01/19 1414  LABPROT 16.9*  INR 1.4*   ABG    Component Value Date/Time   PHART 7.406 04/01/2019 2307   HCO3 25.7 04/01/2019 2307   TCO2 27 04/01/2019 2307   O2SAT 55.8 04/03/2019 0510   CBG (last 3)  Recent Labs    04/03/19 0738 04/03/19 0820 04/03/19 0855  GLUCAP 129* 176* 140*     Assessment/Plan: S/P Procedure(s) (LRB): CORONARY ARTERY BYPASS GRAFTING (CABG) X 5 USING BILATERAL IMA  AND LEFT RADIAL ARTERY (N/A) RADIAL ARTERY HARVEST (OPEN HARVEST) (Left) TRANSESOPHAGEAL ECHOCARDIOGRAM (TEE) (N/A) INDOCYANINE GREEN FLUORESCENCE IMAGING (ICG) (N/A) Mobilize Diuresis remove arterial/central lines  carvedilol   LOS: 6 days    Linden Dolin 04/03/2019

## 2019-04-04 ENCOUNTER — Ambulatory Visit: Payer: Self-pay | Admitting: Family

## 2019-04-04 ENCOUNTER — Encounter: Payer: Self-pay | Admitting: *Deleted

## 2019-04-04 ENCOUNTER — Inpatient Hospital Stay (HOSPITAL_COMMUNITY): Payer: Self-pay

## 2019-04-04 LAB — BASIC METABOLIC PANEL
Anion gap: 11 (ref 5–15)
BUN: 22 mg/dL (ref 8–23)
CO2: 26 mmol/L (ref 22–32)
Calcium: 8.5 mg/dL — ABNORMAL LOW (ref 8.9–10.3)
Chloride: 100 mmol/L (ref 98–111)
Creatinine, Ser: 1.06 mg/dL (ref 0.61–1.24)
GFR calc Af Amer: >60 mL/min (ref >60)
GFR calc non Af Amer: >60 mL/min (ref >60)
Glucose, Bld: 88 mg/dL (ref 70–99)
Potassium: 4.3 mmol/L (ref 3.5–5.1)
Sodium: 137 mmol/L (ref 135–145)

## 2019-04-04 LAB — COOXEMETRY PANEL
Carboxyhemoglobin: 1.1 % (ref 0.5–1.5)
Carboxyhemoglobin: 1 % (ref 0.5–1.5)
Carboxyhemoglobin: 0.9 % (ref 0.5–1.5)
Methemoglobin: 0.6 % (ref 0.0–1.5)
Methemoglobin: 0.7 % (ref 0.0–1.5)
Methemoglobin: 0.4 % (ref 0.0–1.5)
O2 Saturation: 39.7 %
O2 Saturation: 31.7 %
O2 Saturation: 44.8 %
Total hemoglobin: 10.9 g/dL — ABNORMAL LOW (ref 12.0–16.0)
Total hemoglobin: 12.2 g/dL (ref 12.0–16.0)
Total hemoglobin: 15.6 g/dL (ref 12.0–16.0)

## 2019-04-04 LAB — CBC
HCT: 28.7 % — ABNORMAL LOW (ref 39.0–52.0)
Hemoglobin: 8.9 g/dL — ABNORMAL LOW (ref 13.0–17.0)
MCH: 27.7 pg (ref 26.0–34.0)
MCHC: 31 g/dL (ref 30.0–36.0)
MCV: 89.4 fL (ref 80.0–100.0)
Platelets: 176 10*3/uL (ref 150–400)
RBC: 3.21 MIL/uL — ABNORMAL LOW (ref 4.22–5.81)
RDW: 14.3 % (ref 11.5–15.5)
WBC: 13.7 10*3/uL — ABNORMAL HIGH (ref 4.0–10.5)
nRBC: 0 % (ref 0.0–0.2)

## 2019-04-04 LAB — GLUCOSE, CAPILLARY
Glucose-Capillary: 106 mg/dL — ABNORMAL HIGH (ref 70–99)
Glucose-Capillary: 126 mg/dL — ABNORMAL HIGH (ref 70–99)
Glucose-Capillary: 128 mg/dL — ABNORMAL HIGH (ref 70–99)
Glucose-Capillary: 134 mg/dL — ABNORMAL HIGH (ref 70–99)
Glucose-Capillary: 134 mg/dL — ABNORMAL HIGH (ref 70–99)
Glucose-Capillary: 142 mg/dL — ABNORMAL HIGH (ref 70–99)
Glucose-Capillary: 146 mg/dL — ABNORMAL HIGH (ref 70–99)
Glucose-Capillary: 79 mg/dL (ref 70–99)

## 2019-04-04 MED ORDER — LOSARTAN POTASSIUM 25 MG PO TABS
12.5000 mg | ORAL_TABLET | Freq: Every day | ORAL | Status: DC
  Administered 2019-04-04 – 2019-04-06 (×3): 12.5 mg via ORAL
  Filled 2019-04-04 (×3): qty 1

## 2019-04-04 MED ORDER — CARVEDILOL 6.25 MG PO TABS: 6.2500 mg | ORAL_TABLET | Freq: Two times a day (BID) | ORAL | Status: DC

## 2019-04-04 MED ORDER — FUROSEMIDE 10 MG/ML IJ SOLN
40.0000 mg | Freq: Two times a day (BID) | INTRAMUSCULAR | Status: DC
  Administered 2019-04-04 – 2019-04-05 (×3): 40 mg via INTRAVENOUS
  Filled 2019-04-04 (×3): qty 4

## 2019-04-04 MED ORDER — MILRINONE LACTATE IN DEXTROSE 20-5 MG/100ML-% IV SOLN
0.1250 ug/kg/min | INTRAVENOUS | Status: DC
  Administered 2019-04-04 – 2019-04-05 (×2): 0.125 ug/kg/min via INTRAVENOUS
  Filled 2019-04-04: qty 100

## 2019-04-04 MED ORDER — CARVEDILOL 3.125 MG PO TABS
3.1250 mg | ORAL_TABLET | Freq: Two times a day (BID) | ORAL | Status: DC
  Administered 2019-04-04 – 2019-04-05 (×2): 3.125 mg via ORAL
  Filled 2019-04-04 (×2): qty 1

## 2019-04-04 MED ORDER — SORBITOL 70 % SOLN
30.0000 mL | Freq: Once | Status: AC
  Administered 2019-04-04: 30 mL via ORAL
  Filled 2019-04-04: qty 30

## 2019-04-04 MED FILL — Potassium Chloride Inj 2 mEq/ML: INTRAVENOUS | Qty: 40 | Status: AC

## 2019-04-04 MED FILL — Magnesium Sulfate Inj 50%: INTRAMUSCULAR | Qty: 10 | Status: AC

## 2019-04-04 MED FILL — Heparin Sodium (Porcine) Inj 1000 Unit/ML: INTRAMUSCULAR | Qty: 30 | Status: AC

## 2019-04-04 NOTE — Plan of Care (Signed)
Pt is alert and oriented, but did have intermittent confusion an hour after tramadol administration. Pt was easily reoriented. Pt continues on room air, blood pressure has maintained WNL. Pt is able to move all extremities, with right side at baseline, and is able to turn himself in the bed. Pt has been reminded to use heart pillow and abide by sternal precautions. UOP has been adequate. Pt has complained of minimal sternal pain this morning. Call bell is within reach, bed alarm is on, and bed is in lowest position. Problem: Education: Goal: Ability to verbalize understanding of medication therapies will improve Outcome: Progressing   Problem: Cardiac: Goal: Ability to achieve and maintain adequate cardiopulmonary perfusion will improve Outcome: Progressing   Problem: Coping: Goal: Ability to adjust to condition or change in health will improve Outcome: Progressing   Problem: Clinical Measurements: Goal: Ability to maintain clinical measurements within normal limits will improve Outcome: Progressing
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
## 2019-04-04 NOTE — Progress Notes (Signed)
   CO-OX 40% this morning. Repeat CO-OX 32%   Add milrinone 0.125 mcg.   Eiley Mcginnity NP-C  12:14 PM

## 2019-04-04 NOTE — Progress Notes (Signed)
Inpatient Rehabilitation Admissions Coordinator  Inpatient rehab consult received. Please order PT and OT evals to assist with planning rehab needs and then I will follow up.  Ottie Glazier, RN, MSN Rehab Admissions Coordinator [phone] 04/04/2019 4:49 PM

## 2019-04-04 NOTE — Addendum Note (Signed)
Addendum  created 04/04/19 0913 by Adair Laundry, CRNA   Order list changed

## 2019-04-05 LAB — GLUCOSE, CAPILLARY
Glucose-Capillary: 103 mg/dL — ABNORMAL HIGH (ref 70–99)
Glucose-Capillary: 118 mg/dL — ABNORMAL HIGH (ref 70–99)
Glucose-Capillary: 138 mg/dL — ABNORMAL HIGH (ref 70–99)
Glucose-Capillary: 143 mg/dL — ABNORMAL HIGH (ref 70–99)
Glucose-Capillary: 150 mg/dL — ABNORMAL HIGH (ref 70–99)
Glucose-Capillary: 92 mg/dL (ref 70–99)

## 2019-04-05 LAB — COOXEMETRY PANEL
Carboxyhemoglobin: 1.6 % — ABNORMAL HIGH (ref 0.5–1.5)
Methemoglobin: 1.1 % (ref 0.0–1.5)
O2 Saturation: 59.6 %
Total hemoglobin: 9.5 g/dL — ABNORMAL LOW (ref 12.0–16.0)

## 2019-04-05 LAB — BASIC METABOLIC PANEL
Anion gap: 10 (ref 5–15)
BUN: 26 mg/dL — ABNORMAL HIGH (ref 8–23)
CO2: 28 mmol/L (ref 22–32)
Calcium: 8.3 mg/dL — ABNORMAL LOW (ref 8.9–10.3)
Chloride: 98 mmol/L (ref 98–111)
Creatinine, Ser: 1.01 mg/dL (ref 0.61–1.24)
GFR calc Af Amer: >60 mL/min (ref >60)
GFR calc non Af Amer: >60 mL/min (ref >60)
Glucose, Bld: 116 mg/dL — ABNORMAL HIGH (ref 70–99)
Potassium: 3.8 mmol/L (ref 3.5–5.1)
Sodium: 136 mmol/L (ref 135–145)

## 2019-04-05 MED ORDER — CLOPIDOGREL BISULFATE 75 MG PO TABS: 75.0000 mg | ORAL_TABLET | Freq: Every day | ORAL | 1 refills | Status: DC

## 2019-04-05 MED ORDER — LOSARTAN POTASSIUM 25 MG PO TABS: 12.5000 mg | ORAL_TABLET | Freq: Every day | ORAL | 1 refills | Status: DC

## 2019-04-05 MED ORDER — ASPIRIN 81 MG PO CHEW: 81.0000 mg | CHEWABLE_TABLET | Freq: Every day | ORAL | 1 refills | Status: DC

## 2019-04-05 MED ORDER — DIGOXIN 125 MCG PO TABS: 0.1250 mg | ORAL_TABLET | Freq: Every day | ORAL | 1 refills | Status: DC

## 2019-04-05 MED ORDER — POTASSIUM CHLORIDE CRYS ER 20 MEQ PO TBCR
40.0000 meq | EXTENDED_RELEASE_TABLET | Freq: Once | ORAL | Status: AC
  Administered 2019-04-05: 40 meq via ORAL
  Filled 2019-04-05: qty 2

## 2019-04-05 MED ORDER — ACETAMINOPHEN 500 MG PO TABS: 1000.0000 mg | ORAL_TABLET | Freq: Four times a day (QID) | ORAL | 0 refills | Status: AC

## 2019-04-05 MED ORDER — ISOSORBIDE MONONITRATE ER 30 MG PO TB24: 30.0000 mg | ORAL_TABLET | Freq: Every day | ORAL | 3 refills | Status: DC

## 2019-04-05 MED ORDER — ISOSORBIDE MONONITRATE ER 30 MG PO TB24
30.0000 mg | ORAL_TABLET | Freq: Every day | ORAL | Status: DC
  Administered 2019-04-06 – 2019-04-07 (×2): 30 mg via ORAL
  Filled 2019-04-05 (×3): qty 1

## 2019-04-05 MED ORDER — ATORVASTATIN CALCIUM 40 MG PO TABS: 40.0000 mg | ORAL_TABLET | Freq: Every day | ORAL | 1 refills | Status: DC

## 2019-04-05 MED ORDER — OXYCODONE HCL 5 MG PO TABS: 5.0000 mg | ORAL_TABLET | Freq: Four times a day (QID) | ORAL | 0 refills | Status: DC | PRN

## 2019-04-05 MED ORDER — ISOSORBIDE DINITRATE 10 MG PO TABS: 10.0000 mg | ORAL_TABLET | Freq: Three times a day (TID) | ORAL | 1 refills | Status: DC

## 2019-04-05 MED FILL — DIGOXIN 0.125 MG TABLET: 125 | 30 days supply | Qty: 30 | Fill #0

## 2019-04-05 MED FILL — ISOSORBIDE MN ER 30 MG TAB: 30 | 30 days supply | Qty: 30 | Fill #0

## 2019-04-05 MED FILL — CLOPIDOGREL 75 MG TABLET: 75 | 30 days supply | Qty: 30 | Fill #0

## 2019-04-05 MED FILL — ACETAMINOPHEN 500MG XT STRE: 500 | 4 days supply | Qty: 30 | Fill #0

## 2019-04-05 MED FILL — ASPIRIN LOW DOSE 81 MG CHEW: 81 | 30 days supply | Qty: 30 | Fill #0

## 2019-04-05 MED FILL — ATORVASTATIN CALCIUM 40 MG: 40 | 30 days supply | Qty: 30 | Fill #0

## 2019-04-05 NOTE — Progress Notes (Signed)
[address]       [place] [address]             [phone]      4 Days Post-Op Procedure(s) (LRB): CORONARY ARTERY BYPASS GRAFTING (CABG) X 5 USING BILATERAL IMA  AND LEFT RADIAL ARTERY (N/A) RADIAL ARTERY HARVEST (OPEN HARVEST) (Left) TRANSESOPHAGEAL ECHOCARDIOGRAM (TEE) (N/A) INDOCYANINE GREEN FLUORESCENCE IMAGING (ICG) (N/A) Subjective: Back on milrinone yesterday for low Co-Ox in 30-40's range  Objective: Vital signs in last 24 hours: Temp:  [97.7 F (36.5 C)-98.7 F (37.1 C)] 98.6 F (37 C) (03/16 0727) Pulse Rate:  [82-89] 89 (03/16 0819) Cardiac Rhythm: Normal sinus rhythm (03/16 0700) Resp:  [17-26] 19 (03/16 0452) BP: (90-148)/(63-112) 148/75 (03/16 0819) SpO2:  [96 %-99 %] 98 % (03/16 0300) Weight:  [80.6 kg] 80.6 kg (03/16 0300)  Hemodynamic parameters for last 24 hours: CVP:  [4 mmHg-9 mmHg] 4 mmHg  Intake/Output from previous day: 03/15 0701 - 03/16 0700 In: 898.2 [P.O.:880; I.V.:18.2] Out: 2202 [Urine:2200; Stool:2] Intake/Output this shift: No intake/output data recorded.  General appearance: alert, cooperative and no distress Heart: regular rate and rhythm Lungs: fair air exchange, dim in bases Abdomen: nontender Extremities: + edema Wound: incis left arm healing well, N/V intact- chest dressing in place  Lab Results: Recent Labs    04/03/19 0341 04/04/19 0502  WBC 13.1* 13.7*  HGB 9.1* 8.9*  HCT 29.3* 28.7*  PLT 133* 176   BMET:  Recent Labs    04/04/19 0502 04/05/19 0503  NA 137 136  K 4.3 3.8  CL 100 98  CO2 26 28  GLUCOSE 88 116*  BUN 22 26*  CREATININE 1.06 1.01  CALCIUM 8.5* 8.3*    PT/INR: No results for input(s): LABPROT, INR in the last 72 hours. ABG    Component Value Date/Time   PHART 7.406 04/01/2019 2307   HCO3 25.7 04/01/2019 2307   TCO2 27 04/01/2019 2307   O2SAT 59.6 04/05/2019 0530   CBG (last 3)  Recent Labs    04/04/19 2358 04/05/19 0438 04/05/19 0723  GLUCAP 126* 138* 92      Meds Scheduled Meds: . acetaminophen  1,000 mg Oral Q6H   Or  . acetaminophen (TYLENOL) oral liquid 160 mg/5 mL  1,000 mg Per Tube Q6H  . aspirin  81 mg Oral Daily  . atorvastatin  40 mg Oral q1800  . bisacodyl  10 mg Oral Daily   Or  . bisacodyl  10 mg Rectal Daily  . Chlorhexidine Gluconate Cloth  6 each Topical Daily  . clopidogrel  75 mg Oral Daily  . digoxin  0.125 mg Oral Daily  . docusate sodium  200 mg Oral Daily  . feeding supplement (PRO-STAT SUGAR FREE 64)  30 mL Oral BID  . insulin aspart  0-24 Units Subcutaneous Q4H  . insulin detemir  15 Units Subcutaneous Daily  . isosorbide dinitrate  10 mg Oral TID  . losartan  12.5 mg Oral Daily  . mouth rinse  15 mL Mouth Rinse BID  . pantoprazole  40 mg Oral Daily  . sodium chloride flush  10-40 mL Intracatheter Q12H  . sodium chloride flush  3 mL Intravenous Q12H   Continuous Infusions: . sodium chloride Stopped (04/01/19 2114)  . sodium chloride    . sodium chloride 10 mL/hr at 04/01/19 1345  . lactated ringers    . lactated ringers    . lactated ringers Stopped (04/03/19 1123)  .  milrinone 0.125 mcg/kg/min (04/04/19 1221)   PRN Meds:.sodium chloride, dextrose, fentaNYL (SUBLIMAZE) injection, lactated ringers, ondansetron (ZOFRAN) IV, oxyCODONE, sodium chloride flush, sodium chloride flush, traMADol  Xrays DG Chest Port 1 View  Result Date: 04/04/2019 CLINICAL DATA:  Status post median sternotomy. Chest soreness EXAM: PORTABLE CHEST 1 VIEW COMPARISON:  04/03/2019 FINDINGS: Removal of right IJ central line. A right-sided PICC line remains. Bilateral chest tubes have also been removed. Mild cardiomegaly. Atherosclerosis in the transverse aorta. No pleural fluid. 5% right apical pneumothorax is similar, with the visceral pleural line 5 mm from the chest wall. Persistent lower lung predominant atelectasis bilaterally. IMPRESSION: 1. Removal of bilateral chest tubes and right IJ central line. 2. Similar 5% right apical  pneumothorax. 3.  Aortic Atherosclerosis (ICD10-I70.0). Electronically Signed   By: Abigail Miyamoto M.D.   On: 04/04/2019 08:00    Assessment/Plan: S/P Procedure(s) (LRB): CORONARY ARTERY BYPASS GRAFTING (CABG) X 5 USING BILATERAL IMA  AND LEFT RADIAL ARTERY (N/A) RADIAL ARTERY HARVEST (OPEN HARVEST) (Left) TRANSESOPHAGEAL ECHOCARDIOGRAM (TEE) (N/A) INDOCYANINE GREEN FLUORESCENCE IMAGING (ICG) (N/A)  1 placed back on milrinone, CO-OX 59 this am- hoping to stop tomorrow per AHF team - they are guiding systolic HF therapies including diuretics 2 foley out this am 3 OT/PT ordered- wheelchair bound preop 4 cont pulm toilet for atx  LOS: 8 days    Curtis [name] 04/05/2019

## 2019-04-05 NOTE — Progress Notes (Signed)
Inpatient Rehabilitation Admissions Coordinator  Inpatient rehab consult received. I met with patient with physical therapist at bedside. Pt close to baseline for transfers and not in need of an inpt rehab admit at this time. Home with HH and his wife is recommended. We will sign off at this time.  Barbara Boyette, RN, MSN Rehab Admissions Coordinator [phone] 04/05/2019 1:15 PM  

## 2019-04-05 NOTE — Progress Notes (Signed)
Pt does not ambulate. Will need PT/OT. Please order. We will f/u for education. Ethelda Chick CES, ACSM 7:43 AM 04/05/2019
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
## 2019-04-05 NOTE — Plan of Care (Signed)
  Problem: Education: Goal: Ability to demonstrate management of disease process will improve Outcome: Progressing   Problem: Activity: Goal: Capacity to carry out activities will improve Outcome: Progressing   Problem: Cardiac: Goal: Ability to achieve and maintain adequate cardiopulmonary perfusion will improve Outcome: Progressing   Problem: Coping: Goal: Ability to adjust to condition or change in health will improve Outcome: Progressing   Problem: Fluid Volume: Goal: Ability to maintain a balanced intake and output will improve Outcome: Progressing   Problem: Health Behavior/Discharge Planning: Goal: Ability to manage health-related needs will improve Outcome: Progressing   Problem: Skin Integrity: Goal: Risk for impaired skin integrity will decrease Outcome: Progressing   Problem: Health Behavior/Discharge Planning: Goal: Ability to manage health-related needs will improve Outcome: Progressing

## 2019-04-06 LAB — CBC
HCT: 20.2 % — ABNORMAL LOW (ref 39.0–52.0)
HCT: 29 % — ABNORMAL LOW (ref 39.0–52.0)
Hemoglobin: 6.2 g/dL — CL (ref 13.0–17.0)
Hemoglobin: 9 g/dL — ABNORMAL LOW (ref 13.0–17.0)
MCH: 27.8 pg (ref 26.0–34.0)
MCH: 27.5 pg (ref 26.0–34.0)
MCHC: 30.7 g/dL (ref 30.0–36.0)
MCHC: 31 g/dL (ref 30.0–36.0)
MCV: 90.6 fL (ref 80.0–100.0)
MCV: 88.7 fL (ref 80.0–100.0)
Platelets: 198 10*3/uL (ref 150–400)
Platelets: 305 10*3/uL (ref 150–400)
RBC: 2.23 MIL/uL — ABNORMAL LOW (ref 4.22–5.81)
RBC: 3.27 MIL/uL — ABNORMAL LOW (ref 4.22–5.81)
RDW: 14.1 % (ref 11.5–15.5)
RDW: 13.9 % (ref 11.5–15.5)
WBC: 5.8 10*3/uL (ref 4.0–10.5)
WBC: 8.4 10*3/uL (ref 4.0–10.5)
nRBC: 0 % (ref 0.0–0.2)
nRBC: 0 % (ref 0.0–0.2)

## 2019-04-06 LAB — GLUCOSE, CAPILLARY
Glucose-Capillary: 122 mg/dL — ABNORMAL HIGH (ref 70–99)
Glucose-Capillary: 111 mg/dL — ABNORMAL HIGH (ref 70–99)
Glucose-Capillary: 152 mg/dL — ABNORMAL HIGH (ref 70–99)
Glucose-Capillary: 86 mg/dL (ref 70–99)
Glucose-Capillary: 168 mg/dL — ABNORMAL HIGH (ref 70–99)
Glucose-Capillary: 146 mg/dL — ABNORMAL HIGH (ref 70–99)

## 2019-04-06 LAB — DIGOXIN LEVEL: Digoxin Level: 0.3 ng/mL — ABNORMAL LOW (ref 0.8–2.0)

## 2019-04-06 LAB — COOXEMETRY PANEL
Carboxyhemoglobin: 1.6 % — ABNORMAL HIGH (ref 0.5–1.5)
Methemoglobin: 1 % (ref 0.0–1.5)
O2 Saturation: 67.5 %
Total hemoglobin: 11.2 g/dL — ABNORMAL LOW (ref 12.0–16.0)

## 2019-04-06 LAB — TYPE AND SCREEN
ABO/RH(D): A POS
Antibody Screen: NEGATIVE

## 2019-04-06 LAB — BASIC METABOLIC PANEL
Anion gap: 11 (ref 5–15)
BUN: 20 mg/dL (ref 8–23)
CO2: 25 mmol/L (ref 22–32)
Calcium: 7.7 mg/dL — ABNORMAL LOW (ref 8.9–10.3)
Chloride: 104 mmol/L (ref 98–111)
Creatinine, Ser: 0.81 mg/dL (ref 0.61–1.24)
GFR calc Af Amer: >60 mL/min (ref >60)
GFR calc non Af Amer: >60 mL/min (ref >60)
Glucose, Bld: 124 mg/dL — ABNORMAL HIGH (ref 70–99)
Potassium: 3.4 mmol/L — ABNORMAL LOW (ref 3.5–5.1)
Sodium: 140 mmol/L (ref 135–145)

## 2019-04-06 MED ORDER — SPIRONOLACTONE 12.5 MG HALF TABLET
12.5000 mg | ORAL_TABLET | Freq: Every day | ORAL | Status: DC
  Administered 2019-04-06: 12.5 mg via ORAL
  Filled 2019-04-06 (×2): qty 1

## 2019-04-06 MED ORDER — SACUBITRIL-VALSARTAN 24-26 MG PO TABS
1.0000 | ORAL_TABLET | Freq: Two times a day (BID) | ORAL | Status: DC
  Administered 2019-04-06: 1 via ORAL
  Filled 2019-04-06 (×2): qty 1

## 2019-04-06 MED ORDER — POTASSIUM CHLORIDE CRYS ER 20 MEQ PO TBCR
40.0000 meq | EXTENDED_RELEASE_TABLET | Freq: Two times a day (BID) | ORAL | Status: AC
  Administered 2019-04-06 (×2): 40 meq via ORAL
  Filled 2019-04-06 (×2): qty 2

## 2019-04-06 NOTE — Plan of Care (Signed)
Problem: Education: Goal: Ability to demonstrate management of disease process will improve 04/06/2019 0740 by Luna Kitchens, RN Outcome: Progressing 04/06/2019 0740 by Luna Kitchens, RN Outcome: Progressing Goal: Ability to verbalize understanding of medication therapies will improve 04/06/2019 0740 by Luna Kitchens, RN Outcome: Progressing 04/06/2019 0740 by Luna Kitchens, RN Outcome: Progressing Goal: Individualized Educational Video(s) 04/06/2019 0740 by Luna Kitchens, RN Outcome: Progressing 04/06/2019 0740 by Luna Kitchens, RN Outcome: Progressing   Problem: Activity: Goal: Capacity to carry out activities will improve 04/06/2019 0740 by Luna Kitchens, RN Outcome: Progressing 04/06/2019 0740 by Luna Kitchens, RN Outcome: Progressing   Problem: Cardiac: Goal: Ability to achieve and maintain adequate cardiopulmonary perfusion will improve 04/06/2019 0740 by Luna Kitchens, RN Outcome: Progressing 04/06/2019 0740 by Luna Kitchens, RN Outcome: Progressing   Problem: Education: Goal: Ability to describe self-care measures that may prevent or decrease complications (Diabetes Survival Skills Education) will improve 04/06/2019 0740 by Luna Kitchens, RN Outcome: Progressing 04/06/2019 0740 by Luna Kitchens, RN Outcome: Progressing Goal: Individualized Educational Video(s) 04/06/2019 0740 by Luna Kitchens, RN Outcome: Progressing 04/06/2019 0740 by Luna Kitchens, RN Outcome: Progressing   Problem: Coping: Goal: Ability to adjust to condition or change in health will improve 04/06/2019 0740 by Luna Kitchens, RN Outcome: Progressing 04/06/2019 0740 by Luna Kitchens, RN Outcome: Progressing   Problem: Fluid Volume: Goal: Ability to maintain a balanced intake and output will improve 04/06/2019 0740 by Luna Kitchens, RN Outcome: Progressing 04/06/2019 0740 by Luna Kitchens, RN Outcome: Progressing   Problem: Health Behavior/Discharge Planning: Goal: Ability to identify and utilize  available resources and services will improve 04/06/2019 0740 by Luna Kitchens, RN Outcome: Progressing 04/06/2019 0740 by Luna Kitchens, RN Outcome: Progressing Goal: Ability to manage health-related needs will improve 04/06/2019 0740 by Luna Kitchens, RN Outcome: Progressing 04/06/2019 0740 by Luna Kitchens, RN Outcome: Progressing   Problem: Nutritional: Goal: Maintenance of adequate nutrition will improve 04/06/2019 0740 by Luna Kitchens, RN Outcome: Progressing 04/06/2019 0740 by Luna Kitchens, RN Outcome: Progressing Goal: Progress toward achieving an optimal weight will improve 04/06/2019 0740 by Luna Kitchens, RN Outcome: Progressing 04/06/2019 0740 by Luna Kitchens, RN Outcome: Progressing   Problem: Skin Integrity: Goal: Risk for impaired skin integrity will decrease 04/06/2019 0740 by Luna Kitchens, RN Outcome: Progressing 04/06/2019 0740 by Luna Kitchens, RN Outcome: Progressing   Problem: Tissue Perfusion: Goal: Adequacy of tissue perfusion will improve 04/06/2019 0740 by Luna Kitchens, RN Outcome: Progressing 04/06/2019 0740 by Luna Kitchens, RN Outcome: Progressing   Problem: Education: Goal: Knowledge of General Education information will improve Description: Including pain rating scale, medication(s)/side effects and non-pharmacologic comfort measures 04/06/2019 0740 by Luna Kitchens, RN Outcome: Progressing 04/06/2019 0740 by Luna Kitchens, RN Outcome: Progressing   Problem: Health Behavior/Discharge Planning: Goal: Ability to manage health-related needs will improve 04/06/2019 0740 by Luna Kitchens, RN Outcome: Progressing 04/06/2019 0740 by Luna Kitchens, RN Outcome: Progressing   Problem: Clinical Measurements: Goal: Ability to maintain clinical measurements within normal limits will improve 04/06/2019 0740 by Luna Kitchens, RN Outcome: Progressing 04/06/2019 0740 by Luna Kitchens, RN Outcome: Progressing Goal: Will remain free from infection 04/06/2019  0740 by Luna Kitchens, RN Outcome: Progressing 04/06/2019 0740 by Luna Kitchens, RN Outcome: Progressing Goal: Diagnostic test results will improve 04/06/2019 0740 by Luna Kitchens, RN Outcome: Progressing 04/06/2019 0740 by Luna Kitchens, RN Outcome: Progressing Goal: Respiratory complications will improve 04/06/2019 0740 by Luna Kitchens, RN Outcome: Progressing 04/06/2019 0740 by Luna Kitchens, RN Outcome: Progressing Goal: Cardiovascular complication will be avoided 04/06/2019 0740 by Pennie Rushing,  Vinnie Level, RN Outcome: Progressing 04/06/2019 0740 by Shanon Ace, RN Outcome: Progressing   Problem: Activity: Goal: Risk for activity intolerance will decrease 04/06/2019 0740 by Shanon Ace, RN Outcome: Progressing 04/06/2019 0740 by Shanon Ace, RN Outcome: Progressing   Problem: Nutrition: Goal: Adequate nutrition will be maintained 04/06/2019 0740 by Shanon Ace, RN Outcome: Progressing 04/06/2019 0740 by Shanon Ace, RN Outcome: Progressing   Problem: Coping: Goal: Level of anxiety will decrease 04/06/2019 0740 by Shanon Ace, RN Outcome: Progressing 04/06/2019 0740 by Shanon Ace, RN Outcome: Progressing   Problem: Elimination: Goal: Will not experience complications related to bowel motility 04/06/2019 0740 by Shanon Ace, RN Outcome: Progressing 04/06/2019 0740 by Shanon Ace, RN Outcome: Progressing Goal: Will not experience complications related to urinary retention 04/06/2019 0740 by Shanon Ace, RN Outcome: Progressing 04/06/2019 0740 by Shanon Ace, RN Outcome: Progressing   Problem: Pain Managment: Goal: General experience of comfort will improve 04/06/2019 0740 by Shanon Ace, RN Outcome: Progressing 04/06/2019 0740 by Shanon Ace, RN Outcome: Progressing   Problem: Safety: Goal: Ability to remain free from injury will improve 04/06/2019 0740 by Shanon Ace, RN Outcome: Progressing 04/06/2019 0740 by Shanon Ace, RN Outcome: Progressing    Problem: Skin Integrity: Goal: Risk for impaired skin integrity will decrease 04/06/2019 0740 by Shanon Ace, RN Outcome: Progressing 04/06/2019 0740 by Shanon Ace, RN Outcome: Progressing

## 2019-04-06 NOTE — Progress Notes (Signed)
CRITICAL VALUE ALER  Critical Value:  Henoglobin 6.2  Date & Time Notied:  04/06/19 @ 0640  Provider Notified: HENDRICKSON  Orders Received/Actions taken: repeat CBC, Type and Cross

## 2019-04-06 NOTE — Discharge Instructions (Signed)
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
## 2019-04-06 NOTE — Progress Notes (Addendum)
      [address]       [place] [address]             [phone]      5 Days Post-Op Procedure(s) (LRB): CORONARY ARTERY BYPASS GRAFTING (CABG) X 5 USING BILATERAL IMA  AND LEFT RADIAL ARTERY (N/A) RADIAL ARTERY HARVEST (OPEN HARVEST) (Left) TRANSESOPHAGEAL ECHOCARDIOGRAM (TEE) (N/A) INDOCYANINE GREEN FLUORESCENCE IMAGING (ICG) (N/A)   Subjective:  Patient up on side of bed eating breakfast.  States he is doing well. Has no complaints.  Objective: Vital signs in last 24 hours: Temp:  [98.2 F (36.8 C)-99.1 F (37.3 C)] 98.3 F (36.8 C) (03/17 0738) Pulse Rate:  [83-92] 89 (03/17 0738) Cardiac Rhythm: Normal sinus rhythm (03/17 0702) Resp:  [18-22] 20 (03/17 0738) BP: (107-151)/(60-79) 140/73 (03/17 0738) SpO2:  [91 %-94 %] 94 % (03/17 0738) Weight:  [72.7 kg] 72.7 kg (03/17 0500)  Hemodynamic parameters for last 24 hours: CVP:  [2 mmHg-7 mmHg] 2 mmHg  Intake/Output from previous day: 03/16 0701 - 03/17 0700 In: 470 [P.O.:470] Out: 650 [Urine:650]  General appearance: alert, cooperative and no distress Heart: regular rate and rhythm Lungs: clear to auscultation bilaterally Abdomen: soft, non-tender; bowel sounds normal; no masses,  no organomegaly Extremities: edema 3+ pitting, especially in feet, however he is dangling them down on side of bed Wound: clean and dry radial artery harvest site... pravena on sternum  Lab Results: Recent Labs    04/06/19 0550 04/06/19 0657  WBC 5.8 8.4  HGB 6.2* 9.0*  HCT 20.2* 29.0*  PLT 198 305   BMET:  Recent Labs    04/05/19 0503 04/06/19 0550  NA 136 140  K 3.8 3.4*  CL 98 104  CO2 28 25  GLUCOSE 116* 124*  BUN 26* 20  CREATININE 1.01 0.81  CALCIUM 8.3* 7.7*    PT/INR: No results for input(s): LABPROT, INR in the last 72 hours. ABG    Component Value Date/Time   PHART 7.406 04/01/2019 2307   HCO3 25.7 04/01/2019 2307   TCO2 27 04/01/2019 2307   O2SAT 67.5 04/06/2019 0540   CBG (last 3)   Recent Labs    04/05/19 2352 04/06/19 0320 04/06/19 0759  GLUCAP 118* 122* 111*    Assessment/Plan: S/P Procedure(s) (LRB): CORONARY ARTERY BYPASS GRAFTING (CABG) X 5 USING BILATERAL IMA  AND LEFT RADIAL ARTERY (N/A) RADIAL ARTERY HARVEST (OPEN HARVEST) (Left) TRANSESOPHAGEAL ECHOCARDIOGRAM (TEE) (N/A) INDOCYANINE GREEN FLUORESCENCE IMAGING (ICG) (N/A)  1. CV- NSR- on Milrinone per AHF 2. Pulm- no acute issues, continue IS 3. Renal- creatinine stable, continue diuretics, will place TED hose 4. Expected post operative blood loss anemia, critical result of 6.2 this morning, repeat was 9.0.. no need for transfusion, signs of active bleeding 5. Deconditioning- home health, CIR not felt to be needed 6. Dispo- patient stable, wean Milrinone per AHF, will discuss removal of Pravena with Dr. Vickey Sages, home once off Milrinone   LOS: 9 days    Lowella Dandy, PA-C  04/06/2019 Pt seen and examined; agree with PA Barrett note.  Daylyn Christine Z. Vickey Sages, MD [phone]

## 2019-04-07 LAB — COOXEMETRY PANEL
Carboxyhemoglobin: 1.6 % — ABNORMAL HIGH (ref 0.5–1.5)
Methemoglobin: 1 % (ref 0.0–1.5)
O2 Saturation: 61.6 %
Total hemoglobin: 10.1 g/dL — ABNORMAL LOW (ref 12.0–16.0)

## 2019-04-07 LAB — BASIC METABOLIC PANEL
Anion gap: 12 (ref 5–15)
BUN: 18 mg/dL (ref 8–23)
CO2: 26 mmol/L (ref 22–32)
Calcium: 8.9 mg/dL (ref 8.9–10.3)
Chloride: 101 mmol/L (ref 98–111)
Creatinine, Ser: 0.86 mg/dL (ref 0.61–1.24)
GFR calc Af Amer: >60 mL/min (ref >60)
GFR calc non Af Amer: >60 mL/min (ref >60)
Glucose, Bld: 89 mg/dL (ref 70–99)
Potassium: 4.4 mmol/L (ref 3.5–5.1)
Sodium: 139 mmol/L (ref 135–145)

## 2019-04-07 LAB — GLUCOSE, CAPILLARY
Glucose-Capillary: 82 mg/dL (ref 70–99)
Glucose-Capillary: 166 mg/dL — ABNORMAL HIGH (ref 70–99)
Glucose-Capillary: 151 mg/dL — ABNORMAL HIGH (ref 70–99)

## 2019-04-07 MED ORDER — SACUBITRIL-VALSARTAN 49-51 MG PO TABS
1.0000 | ORAL_TABLET | Freq: Two times a day (BID) | ORAL | Status: DC
  Administered 2019-04-07: 1 via ORAL
  Filled 2019-04-07 (×2): qty 1

## 2019-04-07 MED ORDER — FUROSEMIDE 20 MG PO TABS: 20.0000 mg | ORAL_TABLET | Freq: Every day | ORAL | Status: DC

## 2019-04-07 MED ORDER — FUROSEMIDE 20 MG PO TABS: 20.0000 mg | ORAL_TABLET | Freq: Every day | ORAL | 3 refills | Status: DC

## 2019-04-07 MED ORDER — SPIRONOLACTONE 25 MG PO TABS: 25.0000 mg | ORAL_TABLET | Freq: Every day | ORAL | 3 refills | Status: DC

## 2019-04-07 MED ORDER — SACUBITRIL-VALSARTAN 49-51 MG PO TABS: 1.0000 | ORAL_TABLET | Freq: Two times a day (BID) | ORAL | 3 refills | Status: DC

## 2019-04-07 MED ORDER — SPIRONOLACTONE 25 MG PO TABS
25.0000 mg | ORAL_TABLET | Freq: Every day | ORAL | Status: DC
  Administered 2019-04-07: 12.5 mg via ORAL

## 2019-04-07 MED FILL — Sodium Bicarbonate IV Soln 8.4%: INTRAVENOUS | Qty: 50 | Status: AC

## 2019-04-07 MED FILL — Calcium Chloride Inj 10%: INTRAVENOUS | Qty: 10 | Status: AC

## 2019-04-07 MED FILL — Heparin Sodium (Porcine) Inj 1000 Unit/ML: INTRAMUSCULAR | Qty: 20 | Status: AC

## 2019-04-07 MED FILL — Mannitol IV Soln 20%: INTRAVENOUS | Qty: 500 | Status: AC

## 2019-04-07 MED FILL — Sodium Chloride IV Soln 0.9%: INTRAVENOUS | Qty: 4000 | Status: AC

## 2019-04-07 MED FILL — Electrolyte-R (PH 7.4) Solution: INTRAVENOUS | Qty: 5000 | Status: AC

## 2019-04-07 MED FILL — ENTRESTO 49 MG-51 MG TABLET: 49-51 | 30 days supply | Qty: 60 | Fill #0

## 2019-04-07 MED FILL — SPIRONOLACTONE 25 MG TABLET: 25 | 30 days supply | Qty: 30 | Fill #0

## 2019-04-07 MED FILL — FUROSEMIDE 20 MG TAB: 20 | 30 days supply | Qty: 30 | Fill #0
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
## 2019-04-07 NOTE — TOC Transition Note (Addendum)
Transition of Care [facility]) - CM/SW Discharge Note   Patient Details  Name: Curtis [name] MRN: [id number] Date of Birth: 07-02-1955  Transition of Care [facility]) CM/SW Contact:  Leone Haven, RN Phone Number: 04/07/2019, 10:11 AM   Clinical Narrative:    Patient for possible dc today, will need HHRN, HHPT for charity,  NCM made referral to Lupita Leash with [facility] for charity, awaiting to hear back,  She will contact patient to see if he is eligible for charity. His PCP is Mila Merry.  He is also on Entresto,  Heart Failure pharmacy will be assisting him with  patient assistance for the [facility] Lupita Leash with  [facility] states he does not qualify for charity for [facility], HHPT, he and wife makes to much money.  NCM asked patient if he could pay privately for HHPT  Which is 195.00 per visit, he states no he can not, he has to pay bills and he has to eat , so he said not to worry about the HHPT.  TOC brought medications to patient room and he paid for his meds.      Final next level of care: Home w Home Health Services Barriers to Discharge: No Barriers Identified   Patient Goals and CMS Choice Patient states their goals for this hospitalization and ongoing recovery are:: get better      Discharge Placement                       Discharge Plan and Services                          HH Arranged: RN, PT, Disease Management HH Agency: [facility] Date HH Agency Contacted: 04/07/19 Time HH Agency Contacted: 1011 Representative spoke with at [facility] Agency: Lupita Leash  Social Determinants of Health (SDOH) Interventions     Readmission Risk Interventions No flowsheet data found.

## 2019-04-07 NOTE — Progress Notes (Signed)
Discussed sternal precautions, IS, diet, HF management, and CRPII with pt and wife. Receptive. Helped clarify sternal precautions. She brought her truck so she plans to go home and change to his handicap Zenaida Niece to assist getting him in at d/c. Discussed HF booklet with good understanding. Also discussed DM diet. Will refer to [facility] CRPII (modified exercise).  [phone] Ethelda Chick CES, ACSM 12:25 PM 04/07/2019

## 2019-04-07 NOTE — Progress Notes (Signed)
      [address]       [place] [address]             [phone]      6 Days Post-Op Procedure(s) (LRB): CORONARY ARTERY BYPASS GRAFTING (CABG) X 5 USING BILATERAL IMA  AND LEFT RADIAL ARTERY (N/A) RADIAL ARTERY HARVEST (OPEN HARVEST) (Left) TRANSESOPHAGEAL ECHOCARDIOGRAM (TEE) (N/A) INDOCYANINE GREEN FLUORESCENCE IMAGING (ICG) (N/A)   Subjective:  Has some sternal pain.  Worse on awakening.Marland Kitchen of note patient was laying on side which will aggravate sternal discomfort.  Objective: Vital signs in last 24 hours: Temp:  [98.1 F (36.7 C)-98.6 F (37 C)] 98.2 F (36.8 C) (03/18 0335) Pulse Rate:  [86-97] 89 (03/18 0335) Cardiac Rhythm: Normal sinus rhythm (03/18 0700) Resp:  [19-23] 23 (03/18 0335) BP: (127-165)/(77-79) 165/78 (03/18 0335) SpO2:  [90 %-98 %] 94 % (03/18 0335) Weight:  [72.8 kg] 72.8 kg (03/18 0344)  Hemodynamic parameters for last 24 hours: CVP:  [2 mmHg-4 mmHg] 4 mmHg  Intake/Output from previous day: 03/17 0701 - 03/18 0700 In: 261.6 [P.O.:120; I.V.:141.6] Out: 1300 [Urine:1300]  General appearance: alert, cooperative and no distress Heart: regular rate and rhythm Lungs: clear to auscultation bilaterally Abdomen: soft, non-tender; bowel sounds normal; no masses,  no organomegaly Extremities: edema trace-1+, improved Wound: pravena removed, mild fat necrosis present at bottom of sternotomy, otherwise clean and dry surgical sites  Lab Results: Recent Labs    04/06/19 0550 04/06/19 0657  WBC 5.8 8.4  HGB 6.2* 9.0*  HCT 20.2* 29.0*  PLT 198 305   BMET:  Recent Labs    04/06/19 0550 04/07/19 0348  NA 140 139  K 3.4* 4.4  CL 104 101  CO2 25 26  GLUCOSE 124* 89  BUN 20 18  CREATININE 0.81 0.86  CALCIUM 7.7* 8.9    PT/INR: No results for input(s): LABPROT, INR in the last 72 hours. ABG    Component Value Date/Time   PHART 7.406 04/01/2019 2307   HCO3 25.7 04/01/2019 2307   TCO2 27 04/01/2019 2307   O2SAT 61.6  04/07/2019 0340   CBG (last 3)  Recent Labs    04/06/19 2008 04/06/19 2315 04/07/19 0333  GLUCAP 168* 146* 82    Assessment/Plan: S/P Procedure(s) (LRB): CORONARY ARTERY BYPASS GRAFTING (CABG) X 5 USING BILATERAL IMA  AND LEFT RADIAL ARTERY (N/A) RADIAL ARTERY HARVEST (OPEN HARVEST) (Left) TRANSESOPHAGEAL ECHOCARDIOGRAM (TEE) (N/A) INDOCYANINE GREEN FLUORESCENCE IMAGING (ICG) (N/A)  1. CV- NSR, Milrinone stopped yesterday, Coox is 61 this morning, started on Entresto.. BP is a little elevated- medications per AHF 2. Pulm-  No acute issues, continue IS 3. Renal- creatinine stable, diuresing well, TED hose in place.. edema is improved 4. Dispo- patient stable, medications per AHF, will await there recommendations, but can possibly d/c patient home today   LOS: 10 days    Lowella Dandy, PA-C  04/07/2019

## 2019-04-11 ENCOUNTER — Ambulatory Visit: Payer: Self-pay | Admitting: Cardiothoracic Surgery

## 2019-04-15 ENCOUNTER — Other Ambulatory Visit: Payer: Self-pay | Admitting: Cardiothoracic Surgery

## 2019-04-15 DIAGNOSIS — Z951 Presence of aortocoronary bypass graft: Secondary | ICD-10-CM

## 2019-04-18 ENCOUNTER — Other Ambulatory Visit: Payer: Self-pay | Admitting: *Deleted

## 2019-04-18 ENCOUNTER — Ambulatory Visit
Admission: RE | Admit: 2019-04-18 | Discharge: 2019-04-18 | Disposition: A | Discharge status: Home / Self Care | Payer: No Typology Code available for payment source | Source: Ambulatory Visit | Attending: Cardiothoracic Surgery | Admitting: Cardiothoracic Surgery

## 2019-04-18 ENCOUNTER — Ambulatory Visit (INDEPENDENT_AMBULATORY_CARE_PROVIDER_SITE_OTHER): Payer: Self-pay | Admitting: Cardiothoracic Surgery

## 2019-04-18 ENCOUNTER — Other Ambulatory Visit: Payer: Self-pay | Admitting: Cardiothoracic Surgery

## 2019-04-18 ENCOUNTER — Other Ambulatory Visit: Payer: Self-pay

## 2019-04-18 VITALS — BP 128/73 | HR 90 | Temp 97.6°F | Resp 20 | Ht 68.0 in

## 2019-04-18 DIAGNOSIS — Z951 Presence of aortocoronary bypass graft: Secondary | ICD-10-CM

## 2019-04-18 DIAGNOSIS — R062 Wheezing: Secondary | ICD-10-CM

## 2019-04-18 DIAGNOSIS — I2510 Atherosclerotic heart disease of native coronary artery without angina pectoris: Secondary | ICD-10-CM

## 2019-04-18 MED ORDER — IPRATROPIUM-ALBUTEROL 20-100 MCG/ACT IN AERS: 1.0000 | INHALATION_SPRAY | Freq: Four times a day (QID) | RESPIRATORY_TRACT | Status: DC | PRN

## 2019-04-18 MED ORDER — IPRATROPIUM-ALBUTEROL 20-100 MCG/ACT IN AERS: 1.0000 | INHALATION_SPRAY | Freq: Four times a day (QID) | RESPIRATORY_TRACT | 0 refills | Status: DC | PRN

## 2019-04-18 MED ORDER — METOPROLOL TARTRATE 25 MG PO TABS: 12.5000 mg | ORAL_TABLET | Freq: Two times a day (BID) | ORAL | 11 refills | Status: DC

## 2019-04-18 NOTE — Progress Notes (Signed)
[address]       [place] [address]             [phone]     CARDIOTHORACIC SURGERY OFFICE NOTE  Referring Provider is Iran Ouch, MD Primary Cardiologist is Lorine Bears, MD PCP is Malva Limes, MD   HPI:  64 yo man s/p CABG on 3/12 for low EF and multivessel CAD. He did well after surgery despite pre-existing mobility difficulties. He presents now for f/u assessment as outpatient. No complaints of CP or palpitations. Denies f/c.    Current Outpatient Medications  Medication Sig Dispense Refill  . acetaminophen (TYLENOL) 500 MG tablet Take 2 tablets (1,000 mg total) by mouth every 6 (six) hours. 30 tablet 0  . aspirin 81 MG chewable tablet Chew 1 tablet (81 mg total) by mouth daily. 30 tablet 1  . atorvastatin (LIPITOR) 40 MG tablet Take 1 tablet (40 mg total) by mouth daily at 6 PM. 30 tablet 1  . clopidogrel (PLAVIX) 75 MG tablet Take 1 tablet (75 mg total) by mouth daily. 30 tablet 1  . digoxin (LANOXIN) 0.125 MG tablet Take 1 tablet (0.125 mg total) by mouth daily. 30 tablet 1  . furosemide (LASIX) 20 MG tablet Take 1 tablet (20 mg total) by mouth daily. 30 tablet 3  . HYDROcodone-acetaminophen (NORCO/VICODIN) 5-325 MG tablet Take 1 tablet by mouth every 6 (six) hours as needed for moderate pain.    . isosorbide mononitrate (IMDUR) 30 MG 24 hr tablet Take 1 tablet (30 mg total) by mouth daily. 30 tablet 3  . Lancet Devices MISC Use to check blood sugar twice a day 100 each 5  . sacubitril-valsartan (ENTRESTO) 49-51 MG Take 1 tablet by mouth 2 (two) times daily. 60 tablet 3  . spironolactone (ALDACTONE) 25 MG tablet Take 1 tablet (25 mg total) by mouth daily. 30 tablet 3  . metoprolol tartrate (LOPRESSOR) 25 MG tablet Take 0.5 tablets (12.5 mg total) by mouth 2 (two) times daily. 60 tablet 11  . oxyCODONE (OXY IR/ROXICODONE) 5 MG immediate release tablet Take 1 tablet (5 mg total) by mouth every 6 (six) hours as needed for severe pain.  (Patient not taking: Reported on 04/18/2019) 30 tablet 0   Current Facility-Administered Medications  Medication Dose Route Frequency Provider Last Rate Last Admin  . Ipratropium-Albuterol (COMBIVENT) respimat 1 puff  1 puff Inhalation Q6H PRN Linden Dolin, MD          Physical Exam:   BP 128/73   Pulse 90   Temp 97.6 F (36.4 C) (Skin)   Resp 20   Ht 5\' 8"  (1.727 m)   SpO2 98%   BMI 24.40 kg/m   General:  Well-appearing, NAD  Chest:   cta  CV:   rrr  Incisions:  C/d/i  Abdomen:  sntnd  Extremities:  No edema  Diagnostic Tests:  cXR with clear lung fields   Impression:  Doing well after CABG  Plan:  Doing well after surgery Ok to liberalize activity to include use of upper extremities for lifting F/u in 2 weeks with repeat echo to assess LV ejection fraction  I spent in excess of 20 minutes during the conduct of this office consultation and >50% of this time involved direct face-to-face encounter with the patient for counseling and/or coordination of their care.  Level 2                 10 minutes  Level 3                 15 minutes Level 4                 25 minutes Level 5                 40 minutes  B. Murvin Natal, MD 04/18/2019 3:43 PM

## 2019-04-21 ENCOUNTER — Ambulatory Visit (HOSPITAL_COMMUNITY)
Admit: 2019-04-21 | Discharge: 2019-04-21 | Disposition: A | Discharge status: Home / Self Care | Payer: Self-pay | Source: Ambulatory Visit | Attending: Adult Health | Admitting: Adult Health

## 2019-04-21 ENCOUNTER — Encounter (HOSPITAL_COMMUNITY): Payer: Self-pay

## 2019-04-21 ENCOUNTER — Other Ambulatory Visit: Payer: Self-pay

## 2019-04-21 VITALS — BP 132/60 | HR 90 | Ht 68.0 in | Wt 151.0 lb

## 2019-04-21 DIAGNOSIS — Z87891 Personal history of nicotine dependence: Secondary | ICD-10-CM | POA: Insufficient documentation

## 2019-04-21 DIAGNOSIS — I5022 Chronic systolic (congestive) heart failure: Secondary | ICD-10-CM | POA: Insufficient documentation

## 2019-04-21 DIAGNOSIS — I2510 Atherosclerotic heart disease of native coronary artery without angina pectoris: Secondary | ICD-10-CM | POA: Insufficient documentation

## 2019-04-21 DIAGNOSIS — G8929 Other chronic pain: Secondary | ICD-10-CM | POA: Insufficient documentation

## 2019-04-21 DIAGNOSIS — Z7982 Long term (current) use of aspirin: Secondary | ICD-10-CM | POA: Insufficient documentation

## 2019-04-21 DIAGNOSIS — Z7902 Long term (current) use of antithrombotics/antiplatelets: Secondary | ICD-10-CM | POA: Insufficient documentation

## 2019-04-21 DIAGNOSIS — Z993 Dependence on wheelchair: Secondary | ICD-10-CM | POA: Insufficient documentation

## 2019-04-21 DIAGNOSIS — M25551 Pain in right hip: Secondary | ICD-10-CM | POA: Insufficient documentation

## 2019-04-21 DIAGNOSIS — I110 Hypertensive heart disease with heart failure: Secondary | ICD-10-CM | POA: Insufficient documentation

## 2019-04-21 DIAGNOSIS — Z951 Presence of aortocoronary bypass graft: Secondary | ICD-10-CM | POA: Insufficient documentation

## 2019-04-21 DIAGNOSIS — E119 Type 2 diabetes mellitus without complications: Secondary | ICD-10-CM | POA: Insufficient documentation

## 2019-04-21 DIAGNOSIS — E785 Hyperlipidemia, unspecified: Secondary | ICD-10-CM | POA: Insufficient documentation

## 2019-04-21 DIAGNOSIS — Z79899 Other long term (current) drug therapy: Secondary | ICD-10-CM | POA: Insufficient documentation

## 2019-04-21 DIAGNOSIS — Z7984 Long term (current) use of oral hypoglycemic drugs: Secondary | ICD-10-CM | POA: Insufficient documentation

## 2019-04-21 DIAGNOSIS — Z7901 Long term (current) use of anticoagulants: Secondary | ICD-10-CM | POA: Insufficient documentation

## 2019-04-21 LAB — BASIC METABOLIC PANEL
Anion gap: 11 (ref 5–15)
BUN: 21 mg/dL (ref 8–23)
CO2: 27 mmol/L (ref 22–32)
Calcium: 9.6 mg/dL (ref 8.9–10.3)
Chloride: 99 mmol/L (ref 98–111)
Creatinine, Ser: 0.95 mg/dL (ref 0.61–1.24)
GFR calc Af Amer: >60 mL/min (ref >60)
GFR calc non Af Amer: >60 mL/min (ref >60)
Glucose, Bld: 134 mg/dL — ABNORMAL HIGH (ref 70–99)
Potassium: 4.6 mmol/L (ref 3.5–5.1)
Sodium: 137 mmol/L (ref 135–145)

## 2019-04-21 LAB — DIGOXIN LEVEL: Digoxin Level: 0.5 ng/mL — ABNORMAL LOW (ref 0.8–2.0)

## 2019-04-21 MED ORDER — CLOPIDOGREL BISULFATE 75 MG PO TABS: 75.0000 mg | ORAL_TABLET | Freq: Every day | ORAL | 1 refills | Status: DC

## 2019-04-21 MED ORDER — FARXIGA 10 MG PO TABS: 10.0000 mg | ORAL_TABLET | Freq: Every day | ORAL | 6 refills | Status: DC

## 2019-04-21 MED ORDER — ATORVASTATIN CALCIUM 40 MG PO TABS: 40.0000 mg | ORAL_TABLET | Freq: Every day | ORAL | 1 refills | Status: DC

## 2019-04-21 MED ORDER — SPIRONOLACTONE 25 MG PO TABS: 25.0000 mg | ORAL_TABLET | Freq: Every day | ORAL | 1 refills | Status: DC

## 2019-04-21 MED ORDER — DIGOXIN 125 MCG PO TABS: 0.1250 mg | ORAL_TABLET | Freq: Every day | ORAL | 1 refills | Status: DC

## 2019-04-21 MED ORDER — FUROSEMIDE 20 MG PO TABS: 20.0000 mg | ORAL_TABLET | Freq: Every day | ORAL | 1 refills | Status: DC

## 2019-04-21 MED ORDER — ASPIRIN 81 MG PO CHEW: 81.0000 mg | CHEWABLE_TABLET | Freq: Every day | ORAL | 1 refills | Status: AC

## 2019-04-21 MED ORDER — METOPROLOL TARTRATE 25 MG PO TABS: 12.5000 mg | ORAL_TABLET | Freq: Two times a day (BID) | ORAL | 1 refills | Status: DC

## 2019-04-21 MED ORDER — FARXIGA 10 MG PO TABS: 10.0000 mg | ORAL_TABLET | Freq: Every day | ORAL | 1 refills | Status: DC

## 2019-04-21 MED FILL — FARXIGA 10 MG TABLET: 10 | 30 days supply | Qty: 30 | Fill #0

## 2019-04-21 MED FILL — METOPROLOL TARTRATE 25 MG T: 25 | 30 days supply | Qty: 30 | Fill #0

## 2019-04-21 NOTE — Patient Instructions (Addendum)
INCREASE Lasix to 40 mg daily for 1 day, then resume 20 mg daily thereafter START Farxiga 10 mg, one tab daily  Labs today We will only contact you if something comes back abnormal or we need to make some changes. Otherwise no news is good news!  Please wear your compression hose daily, place them on as soon as you get up in the morning and remove before you go to bed at night.   Your physician recommends that you schedule a follow-up appointment in: 2-3 weeks with Leotis Shames, PharmD  Do the following things EVERYDAY: 1) Weigh yourself in the morning before breakfast. Write it down and keep it in a log. 2) Take your medicines as prescribed 3) Eat low salt foods-Limit salt (sodium) to 2000 mg per day.  4) Stay as active as you can everyday 5) Limit all fluids for the day to less than 2 liters At the [facility], you and your health needs are our priority. As part of our continuing mission to provide you with exceptional heart care, we have created designated Provider Care Teams. These Care Teams include your primary Cardiologist (physician) and Advanced Practice Providers (APPs- Physician Assistants and Nurse Practitioners) who all work together to provide you with the care you need, when you need it.   You may see any of the following providers on your designated Care Team at your next follow up: Marland Kitchen Dr Arvilla Meres . Dr Marca Ancona . Tonye Becket, NP . Robbie Lis, PA . Karle Plumber, PharmD   Please be sure to bring in all your medications bottles to every appointment.
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
## 2019-04-21 NOTE — Progress Notes (Signed)
Medication Samples have been provided to the patient.  Drug name: entresto       Strength: 49/51        Qty: 28  LOT: [id number]   Exp.Date: 06/2020  Dosing instructions: one tab twice daily  The patient has been instructed regarding the correct time, dose, and frequency of taking this medication, including desired effects and most common side effects.   Magda Bernheim M 2:12 PM 04/21/2019
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
## 2019-04-25 ENCOUNTER — Telehealth (HOSPITAL_COMMUNITY): Payer: Self-pay | Admitting: Licensed Clinical Social Worker

## 2019-04-25 ENCOUNTER — Other Ambulatory Visit: Payer: Self-pay

## 2019-04-25 ENCOUNTER — Ambulatory Visit (HOSPITAL_COMMUNITY)
Admission: RE | Admit: 2019-04-25 | Discharge: 2019-04-25 | Disposition: A | Discharge status: Home / Self Care | Payer: Self-pay | Source: Ambulatory Visit | Attending: Cardiothoracic Surgery | Admitting: Cardiothoracic Surgery

## 2019-04-25 ENCOUNTER — Ambulatory Visit (HOSPITAL_COMMUNITY)
Admission: RE | Admit: 2019-04-25 | Discharge: 2019-04-25 | Disposition: A | Discharge status: Home / Self Care | Payer: Self-pay | Source: Ambulatory Visit | Attending: Cardiology | Admitting: Cardiology

## 2019-04-25 ENCOUNTER — Encounter (HOSPITAL_COMMUNITY): Payer: Self-pay

## 2019-04-25 DIAGNOSIS — E119 Type 2 diabetes mellitus without complications: Secondary | ICD-10-CM | POA: Insufficient documentation

## 2019-04-25 DIAGNOSIS — I509 Heart failure, unspecified: Secondary | ICD-10-CM

## 2019-04-25 DIAGNOSIS — Z951 Presence of aortocoronary bypass graft: Secondary | ICD-10-CM | POA: Insufficient documentation

## 2019-04-25 DIAGNOSIS — I110 Hypertensive heart disease with heart failure: Secondary | ICD-10-CM | POA: Insufficient documentation

## 2019-04-25 NOTE — Telephone Encounter (Signed)
Prescriber portion of [employer] for [employer] assistance completed- faxed to [employer] for review- fax confirmation received.  Burna Sis, LCSW Clinical Social Worker [facility] Desk#: [phone] Cell#: [phone]

## 2019-04-25 NOTE — Addendum Note (Signed)
Encounter addended by: Modesta Messing, CMA on: 04/25/2019 3:35 PM  Actions taken: Order list changed

## 2019-04-25 NOTE — Progress Notes (Signed)
  Echocardiogram 2D Echocardiogram has been performed.  Leta Jungling M 04/25/2019, 3:23 PM

## 2019-04-26 ENCOUNTER — Ambulatory Visit: Payer: Self-pay | Admitting: Physician Assistant

## 2019-04-28 ENCOUNTER — Telehealth (HOSPITAL_COMMUNITY): Payer: Self-pay | Admitting: Pharmacist

## 2019-04-28 NOTE — Telephone Encounter (Signed)
[facility] Patient Advocate Encounter   Patient was approved to receive Entresto from [employer].  Patient ID: [id number] Effective dates: 04/28/19 through 04/27/20  Karle Plumber, PharmD, BCPS, BCCP, CPP [facility] Pharmacist [phone]

## 2019-04-29 MED FILL — CLOPIDOGREL 75 MG TABLET: 75 | 30 days supply | Qty: 30 | Fill #0

## 2019-04-29 MED FILL — SPIRONOLACTONE 25 MG TABS: 25 | 30 days supply | Qty: 30 | Fill #0

## 2019-04-29 MED FILL — ATORVASTATIN 40 MG TABLET: 40 | 30 days supply | Qty: 30 | Fill #0

## 2019-04-29 MED FILL — DIGOXIN 0.125 MG TABLET: 125 | 30 days supply | Qty: 30 | Fill #0

## 2019-04-29 MED FILL — FUROSEMIDE 20 MG TABS: 20 | 30 days supply | Qty: 30 | Fill #0
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
## 2019-05-02 ENCOUNTER — Encounter: Payer: Self-pay | Admitting: Cardiothoracic Surgery

## 2019-05-02 ENCOUNTER — Ambulatory Visit (INDEPENDENT_AMBULATORY_CARE_PROVIDER_SITE_OTHER): Payer: Self-pay | Admitting: Cardiothoracic Surgery

## 2019-05-02 ENCOUNTER — Other Ambulatory Visit: Payer: Self-pay

## 2019-05-02 VITALS — BP 115/74 | HR 73 | Temp 98.4°F | Resp 16 | Ht 68.0 in | Wt 152.0 lb

## 2019-05-02 DIAGNOSIS — Z951 Presence of aortocoronary bypass graft: Secondary | ICD-10-CM

## 2019-05-02 DIAGNOSIS — I2510 Atherosclerotic heart disease of native coronary artery without angina pectoris: Secondary | ICD-10-CM

## 2019-05-02 MED ORDER — CEPHALEXIN 500 MG PO CAPS: 500.0000 mg | ORAL_CAPSULE | Freq: Four times a day (QID) | ORAL | 0 refills | Status: AC

## 2019-05-02 NOTE — Progress Notes (Signed)
[address]       [name] [address]             [phone]     CARDIOTHORACIC SURGERY OFFICE NOTE  Referring Provider is Iran Ouch, MD Primary Cardiologist is Lorine Bears, MD PCP is Malva Limes, MD   HPI:  64 yo man approximtely one month s/p CABG for ICM. Doing well overall with improved energy level, no chest pain or shortness of breath. Denies f/c. Good appetite.  Does note new swelling of sternal incision   Current Outpatient Medications  Medication Sig Dispense Refill  . acetaminophen (TYLENOL) 500 MG tablet Take 2 tablets (1,000 mg total) by mouth every 6 (six) hours. 30 tablet 0  . aspirin 81 MG chewable tablet Chew 1 tablet (81 mg total) by mouth daily. 30 tablet 1  . atorvastatin (LIPITOR) 40 MG tablet Take 1 tablet (40 mg total) by mouth daily at 6 PM. 30 tablet 1  . clopidogrel (PLAVIX) 75 MG tablet Take 1 tablet (75 mg total) by mouth daily. 30 tablet 1  . dapagliflozin propanediol (FARXIGA) 10 MG TABS tablet Take 10 mg by mouth daily before breakfast. 30 tablet 1  . digoxin (LANOXIN) 0.125 MG tablet Take 1 tablet (0.125 mg total) by mouth daily. 30 tablet 1  . furosemide (LASIX) 20 MG tablet Take 1 tablet (20 mg total) by mouth daily. 30 tablet 1  . glipiZIDE (GLUCOTROL) 5 MG tablet Take 5 mg by mouth daily before breakfast.    . HYDROcodone-acetaminophen (NORCO/VICODIN) 5-325 MG tablet Take 1 tablet by mouth every 6 (six) hours as needed for moderate pain.    . Ipratropium-Albuterol (COMBIVENT) 20-100 MCG/ACT AERS respimat Inhale 1 puff into the lungs every 6 (six) hours as needed for wheezing. 4 g 0  . Lancet Devices MISC Use to check blood sugar twice a day 100 each 5  . metoprolol tartrate (LOPRESSOR) 25 MG tablet Take 0.5 tablets (12.5 mg total) by mouth 2 (two) times daily. 60 tablet 1  . sacubitril-valsartan (ENTRESTO) 49-51 MG Take 1 tablet by mouth 2 (two) times daily. 60 tablet 3  . spironolactone (ALDACTONE) 25 MG  tablet Take 1 tablet (25 mg total) by mouth daily. 30 tablet 1  . cephALEXin (KEFLEX) 500 MG capsule Take 1 capsule (500 mg total) by mouth 4 (four) times daily for 10 days. 40 capsule 0   No current facility-administered medications for this visit.      Physical Exam:   BP 115/74 (BP Location: Left Arm, Patient Position: Sitting, Cuff Size: Normal)   Pulse 73   Temp 98.4 F (36.9 C)   Resp 16   Ht 5\' 8"  (1.727 m)   Wt 68.9 kg   SpO2 96% Comment: RA  BMI 23.11 kg/m   General:  Well-appearing, NAD  Chest:   cta  CV:   rrr  Incisions:  4x1 cm fluctuence at mid-sternum, expressed thick purulent material ; cleansed and packed with moist saline gauze    Extremities:  No edema  Diagnostic Tests: No new information   Impression:  64 yo man s/p CABG for ischemic cardiomyopathy doing well but now with mid-sternal soft tissue defect  Plan:  Instructed wife on how to pack wound with wet-to-dry dressing changes BID Prescrption for Keflex given F/u in one week for wound check  I spent in excess of 20 minutes during the conduct of this office consultation and >50% of this time involved direct  face-to-face encounter with the patient for counseling and/or coordination of their care.  Level 2                 10 minutes Level 3                 15 minutes Level 4                 25 minutes Level 5                 40 minutes  B. Murvin Natal, MD 05/02/2019 2:39 PM

## 2019-05-09 ENCOUNTER — Ambulatory Visit: Payer: Self-pay | Admitting: Cardiothoracic Surgery

## 2019-05-09 ENCOUNTER — Other Ambulatory Visit: Payer: Self-pay

## 2019-05-09 VITALS — BP 137/75 | HR 69 | Temp 97.9°F | Resp 20 | Ht 68.0 in | Wt 152.0 lb

## 2019-05-09 DIAGNOSIS — I2510 Atherosclerotic heart disease of native coronary artery without angina pectoris: Secondary | ICD-10-CM

## 2019-05-09 DIAGNOSIS — Z951 Presence of aortocoronary bypass graft: Secondary | ICD-10-CM

## 2019-05-09 NOTE — Progress Notes (Signed)
[address]       [place] [address]             [phone]     CARDIOTHORACIC SURGERY OFFICE NOTE  Referring Provider is Wellington Hampshire, MD Primary Cardiologist is Kathlyn Sacramento, MD PCP is Birdie Sons, MD   HPI:  64 yo man presents for wound check. He has had mid sternal incision packed with we-to-dry gauze for one week. Making significant progress. No f/c. Denies sternal pain.  Current Outpatient Medications  Medication Sig Dispense Refill  . acetaminophen (TYLENOL) 500 MG tablet Take 2 tablets (1,000 mg total) by mouth every 6 (six) hours. 30 tablet 0  . aspirin 81 MG chewable tablet Chew 1 tablet (81 mg total) by mouth daily. 30 tablet 1  . atorvastatin (LIPITOR) 40 MG tablet Take 1 tablet (40 mg total) by mouth daily at 6 PM. 30 tablet 1  . cephALEXin (KEFLEX) 500 MG capsule Take 1 capsule (500 mg total) by mouth 4 (four) times daily for 10 days. 40 capsule 0  . clopidogrel (PLAVIX) 75 MG tablet Take 1 tablet (75 mg total) by mouth daily. 30 tablet 1  . dapagliflozin propanediol (FARXIGA) 10 MG TABS tablet Take 10 mg by mouth daily before breakfast. 30 tablet 1  . digoxin (LANOXIN) 0.125 MG tablet Take 1 tablet (0.125 mg total) by mouth daily. 30 tablet 1  . furosemide (LASIX) 20 MG tablet Take 1 tablet (20 mg total) by mouth daily. 30 tablet 1  . glipiZIDE (GLUCOTROL) 5 MG tablet Take 5 mg by mouth daily before breakfast.    . HYDROcodone-acetaminophen (NORCO/VICODIN) 5-325 MG tablet Take 1 tablet by mouth every 6 (six) hours as needed for moderate pain.    Elmore Guise Devices MISC Use to check blood sugar twice a day 100 each 5  . metoprolol tartrate (LOPRESSOR) 25 MG tablet Take 0.5 tablets (12.5 mg total) by mouth 2 (two) times daily. 60 tablet 1  . sacubitril-valsartan (ENTRESTO) 49-51 MG Take 1 tablet by mouth 2 (two) times daily. 60 tablet 3  . spironolactone (ALDACTONE) 25 MG tablet Take 1 tablet (25 mg total) by mouth daily. 30 tablet 1     No current facility-administered medications for this visit.      Physical Exam:   BP 137/75   Pulse 69   Temp 97.9 F (36.6 C) (Skin)   Resp 20   Ht 5\' 8"  (1.727 m)   Wt 68.9 kg   SpO2 96% Comment: RA  BMI 23.11 kg/m   General:  Well-appearing, NAD  Chest:   cta  CV:   rrr  Incisions:  Small mid-sternal defect, packed with 1" nu-gauze  Abdomen:  sntnd  Extremities:  No edema  Diagnostic Tests:  none   Impression:  Much improved  Plan:  Pack small defect with 1" nu-gauze moist-to-dry. When too small to pack, place band-aid over incision.  F/u as needed.   I spent in excess of 15 minutes during the conduct of this office consultation and >50% of this time involved direct face-to-face encounter with the patient for counseling and/or coordination of their care.  Level 2                 10 minutes Level 3                 15 minutes Level 4  25 minutes Level 5                 40 minutes  B. Lorayne Marek, MD 05/09/2019 1:21 PM

## 2019-05-11 ENCOUNTER — Ambulatory Visit (HOSPITAL_COMMUNITY)
Admission: RE | Admit: 2019-05-11 | Discharge: 2019-05-11 | Disposition: A | Discharge status: Home / Self Care | Payer: Self-pay | Source: Ambulatory Visit | Attending: Internal Medicine | Admitting: Internal Medicine

## 2019-05-11 ENCOUNTER — Other Ambulatory Visit: Payer: Self-pay

## 2019-05-11 VITALS — BP 118/68 | HR 73 | Wt 155.2 lb

## 2019-05-11 DIAGNOSIS — G8929 Other chronic pain: Secondary | ICD-10-CM | POA: Insufficient documentation

## 2019-05-11 DIAGNOSIS — I255 Ischemic cardiomyopathy: Secondary | ICD-10-CM | POA: Insufficient documentation

## 2019-05-11 DIAGNOSIS — E785 Hyperlipidemia, unspecified: Secondary | ICD-10-CM | POA: Insufficient documentation

## 2019-05-11 DIAGNOSIS — E119 Type 2 diabetes mellitus without complications: Secondary | ICD-10-CM | POA: Insufficient documentation

## 2019-05-11 DIAGNOSIS — I5020 Unspecified systolic (congestive) heart failure: Secondary | ICD-10-CM

## 2019-05-11 DIAGNOSIS — Z7902 Long term (current) use of antithrombotics/antiplatelets: Secondary | ICD-10-CM | POA: Insufficient documentation

## 2019-05-11 DIAGNOSIS — Z993 Dependence on wheelchair: Secondary | ICD-10-CM | POA: Insufficient documentation

## 2019-05-11 DIAGNOSIS — I5022 Chronic systolic (congestive) heart failure: Secondary | ICD-10-CM | POA: Insufficient documentation

## 2019-05-11 DIAGNOSIS — Z7984 Long term (current) use of oral hypoglycemic drugs: Secondary | ICD-10-CM | POA: Insufficient documentation

## 2019-05-11 DIAGNOSIS — Z7982 Long term (current) use of aspirin: Secondary | ICD-10-CM | POA: Insufficient documentation

## 2019-05-11 DIAGNOSIS — Z79899 Other long term (current) drug therapy: Secondary | ICD-10-CM | POA: Insufficient documentation

## 2019-05-11 DIAGNOSIS — I2510 Atherosclerotic heart disease of native coronary artery without angina pectoris: Secondary | ICD-10-CM | POA: Insufficient documentation

## 2019-05-11 DIAGNOSIS — Z951 Presence of aortocoronary bypass graft: Secondary | ICD-10-CM | POA: Insufficient documentation

## 2019-05-11 LAB — BASIC METABOLIC PANEL
Anion gap: 12 (ref 5–15)
BUN: 22 mg/dL (ref 8–23)
CO2: 23 mmol/L (ref 22–32)
Calcium: 9.2 mg/dL (ref 8.9–10.3)
Chloride: 102 mmol/L (ref 98–111)
Creatinine, Ser: 1.15 mg/dL (ref 0.61–1.24)
GFR calc Af Amer: >60 mL/min (ref >60)
GFR calc non Af Amer: >60 mL/min (ref >60)
Glucose, Bld: 168 mg/dL — ABNORMAL HIGH (ref 70–99)
Potassium: 4.3 mmol/L (ref 3.5–5.1)
Sodium: 137 mmol/L (ref 135–145)

## 2019-05-11 LAB — BRAIN NATRIURETIC PEPTIDE: B Natriuretic Peptide: 267.4 pg/mL — ABNORMAL HIGH (ref 0.0–100.0)

## 2019-05-11 MED ORDER — SACUBITRIL-VALSARTAN 97-103 MG PO TABS: 1.0000 | ORAL_TABLET | Freq: Two times a day (BID) | ORAL | 3 refills | Status: DC

## 2019-05-11 MED ORDER — FUROSEMIDE 20 MG PO TABS: 10.0000 mg | ORAL_TABLET | Freq: Every day | ORAL | 6 refills | Status: DC

## 2019-05-11 NOTE — Patient Instructions (Signed)
It was a pleasure seeing you today!  MEDICATIONS: -We are changing your medications today -Increase Entresto to 97/103 mg (1 tablet) twice daily. You may take 2 tablets of the 49/51 mg strength until you receive the new strength.  -Call if you have questions about your medications.  LABS: -We will call you if your labs need attention.  NEXT APPOINTMENT: Return to clinic in 3 weeks with Pharmacy Clinic.  In general, to take care of your heart failure: -Limit your fluid intake to 2 Liters (half-gallon) per day.   -Limit your salt intake to ideally 2-3 grams (2000-3000 mg) per day. -Weigh yourself daily and record, and bring that "weight diary" to your next appointment.  (Weight gain of 2-3 pounds in 1 day typically means fluid weight.) -The medications for your heart are to help your heart and help you live longer.   -Please contact us before stopping any of your heart medications.  Call the clinic at [phone] with questions or to reschedule future appointments.

## 2019-05-23 MED FILL — FARXIGA 10 MG TABLET: 10 | 30 days supply | Qty: 30 | Fill #1

## 2019-05-23 NOTE — Progress Notes (Signed)
I,[name],acting as a scribe for [name], MD.,have documented all relevant documentation on the behalf of [name], MD,as directed by  [name], MD while in the presence of [name], MD.  Established patient visit   Patient: Curtis [name]   DOB: [birth year]   64 y.o. Male  MRN: [id number] Visit Date: 05/24/2019  Today's healthcare provider: [name], MD   Chief Complaint  Patient presents with  . Diabetes  . Hyperlipidemia   Subjective    HPI Diabetes Mellitus Type II, Follow-up  Lab Results  Component Value Date   HGBA1C 7.3 (A) 05/24/2019   HGBA1C 7.7 (H) 03/25/2019   HGBA1C 7.0 (H) 12/27/2018   Last seen for diabetes 5 months ago.  Management since then includes continuing same medication. He reports good compliance with treatment. He is not having side effects.  Symptoms: No fatigue No foot ulcerations No appetite changes No nausea No paresthesia (numbness or tingling) of the feet  No polydipsia (excessive thirst) No polyuria (frequent urination) Yes visual disturbances  No vomiting  Home blood sugar records: fasting range: 140's  Episodes of hypoglycemia? No    Current insulin regiment: none Most Recent Eye Exam: not UTD Current exercise: none Current diet habits: in general, a "healthy" diet    Pertinent Labs: Lab Results  Component Value Date   CHOL 149 03/26/2019   HDL 21 (L) 03/26/2019   LDLCALC 69 03/26/2019   TRIG 296 (H) 03/26/2019   CHOLHDL 7.1 03/26/2019   Lab Results  Component Value Date   NA 137 05/11/2019   K 4.3 05/11/2019   CO2 23 05/11/2019   GLUCOSE 168 (H) 05/11/2019   BUN 22 05/11/2019   CREATININE 1.15 05/11/2019   CALCIUM 9.2 05/11/2019   GFRNONAA >60 05/11/2019   GFRAA >60 05/11/2019     Wt Readings from Last 3 Encounters:  05/11/19 155 lb 3.2 oz (70.4 kg)  05/09/19 152 lb (68.9 kg)  05/02/19 152 lb (68.9 kg)     --------------------------------------------------------------------------------------------------- Lipid/Cholesterol, Follow-up  Last lipid panel Other pertinent labs  Lab Results  Component Value Date   CHOL 149 03/26/2019   HDL 21 (L) 03/26/2019   LDLCALC 69 03/26/2019   TRIG 296 (H) 03/26/2019   CHOLHDL 7.1 03/26/2019   Lab Results  Component Value Date   ALT 17 03/31/2019   AST 20 03/31/2019   PLT 305 04/06/2019   TSH 5.230 (H) 09/06/2018     He was last seen for this 5 months ago.  Management since that visit includes starting Atorvastatin 20mg  daily.  He reports good compliance with treatment. He is not having side effects.  Symptoms: Yes chest pain (since cardiac surgery) Yes chest pressure/discomfort (since cardiac surgery) No dyspnea Yes lower extremity edema No numbness or tingling of extremity No orthopnea No palpitations No paroxysmal nocturnal dyspnea No speech difficulty No syncope  Current diet: in general, a "healthy" diet   Current exercise: none  Wt Readings from Last 3 Encounters:  05/11/19 155 lb 3.2 oz (70.4 kg)  05/09/19 152 lb (68.9 kg)  05/02/19 152 lb (68.9 kg)   The ASCVD Risk score Mikey Bussing DC Jr., et al., 2013) failed to calculate for the following reasons:   The patient has a prior MI or stroke diagnosis  -----------------------------------------------------------------------------------------    Medications: Outpatient Medications Prior to Visit  Medication Sig  . acetaminophen (TYLENOL) 500 MG tablet Take 2 tablets (1,000 mg total) by mouth every 6 (six) hours.  Marland Kitchen  aspirin 81 MG chewable tablet Chew 1 tablet (81 mg total) by mouth daily.  Marland Kitchen atorvastatin (LIPITOR) 40 MG tablet Take 1 tablet (40 mg total) by mouth daily at 6 PM.  . clopidogrel (PLAVIX) 75 MG tablet Take 1 tablet (75 mg total) by mouth daily.  . dapagliflozin propanediol (FARXIGA) 10 MG TABS tablet Take 10 mg by mouth daily before breakfast.  . digoxin (LANOXIN)  0.125 MG tablet Take 1 tablet (0.125 mg total) by mouth daily.  . furosemide (LASIX) 20 MG tablet Take 0.5 tablets (10 mg total) by mouth daily.  Marland Kitchen glipiZIDE (GLUCOTROL) 5 MG tablet Take 5 mg by mouth daily before breakfast.  . HYDROcodone-acetaminophen (NORCO/VICODIN) 5-325 MG tablet Take 1 tablet by mouth every 6 (six) hours as needed for moderate pain.  Demetra Shiner Devices MISC Use to check blood sugar twice a day  . metoprolol tartrate (LOPRESSOR) 25 MG tablet Take 0.5 tablets (12.5 mg total) by mouth 2 (two) times daily.  . sacubitril-valsartan (ENTRESTO) 97-103 MG Take 1 tablet by mouth 2 (two) times daily.  Marland Kitchen spironolactone (ALDACTONE) 25 MG tablet Take 1 tablet (25 mg total) by mouth daily.   No facility-administered medications prior to visit.    Review of Systems  Constitutional: Negative for appetite change, chills and fever.  Eyes: Positive for visual disturbance.  Respiratory: Negative for chest tightness, shortness of breath and wheezing.   Cardiovascular: Positive for chest pain (since cardiac surgery) and leg swelling (in feet; improved with Lasix). Negative for palpitations.  Gastrointestinal: Negative for abdominal pain, nausea and vomiting.      Objective    BP 116/62 (BP Location: Right Arm, Patient Position: Sitting, Cuff Size: Normal)   Pulse 66   Resp 16   SpO2 97% Comment: room air   Physical Exam   General: Appearance:    Well developed, well nourished male in no acute distress  Eyes:    PERRL, conjunctiva/corneas clear, EOM's intact       Lungs:     Clear to auscultation bilaterally, respirations unlabored  Heart:    Normal heart rate. Normal rhythm. No murmurs, rubs, or gallops.   Neurologic:   Awake, alert, oriented x 3. No apparent focal neurological           defect.         Results for orders placed or performed in visit on 05/24/19  POCT HgB A1C  Result Value Ref Range   Hemoglobin A1C 7.3 (A) 4.0 - 5.6 %   Est. average glucose Bld gHb Est-mCnc  163     Assessment & Plan    1. Controlled type 2 diabetes mellitus without complication, without long-term current use of insulin (HCC) Well controlled.  Continue current medications.   - glipiZIDE (GLUCOTROL) 5 MG tablet; Take 1 tablet (5 mg total) by mouth daily before breakfast.  Dispense: 90 tablet; Refill: 3  2. Chronic pain of right lower extremity refill- HYDROcodone-acetaminophen (NORCO/VICODIN) 5-325 MG tablet; Take 1 tablet by mouth every 6 (six) hours as needed for moderate pain.  Dispense: 30 tablet; Refill: 0   Return in about 4 months (around 09/24/2019).      The entirety of the information documented in the History of Present Illness, Review of Systems and Physical Exam were personally obtained by me. Portions of this information were initially documented by the CMA and reviewed by me for thoroughness and accuracy.      Mila Merry, MD  [facility] [phone] (phone) [phone] (fax)  [place]

## 2019-05-24 ENCOUNTER — Ambulatory Visit (INDEPENDENT_AMBULATORY_CARE_PROVIDER_SITE_OTHER): Payer: Self-pay | Admitting: Family Medicine

## 2019-05-24 ENCOUNTER — Other Ambulatory Visit: Payer: Self-pay

## 2019-05-24 ENCOUNTER — Encounter: Payer: Self-pay | Admitting: Family Medicine

## 2019-05-24 VITALS — BP 116/62 | HR 66 | Resp 16

## 2019-05-24 DIAGNOSIS — M79604 Pain in right leg: Secondary | ICD-10-CM

## 2019-05-24 DIAGNOSIS — G8929 Other chronic pain: Secondary | ICD-10-CM

## 2019-05-24 DIAGNOSIS — E119 Type 2 diabetes mellitus without complications: Secondary | ICD-10-CM

## 2019-05-24 LAB — POCT GLYCOSYLATED HEMOGLOBIN (HGB A1C)
Est. average glucose Bld gHb Est-mCnc: 163
Hemoglobin A1C: 7.3 % — AB (ref 4.0–5.6)

## 2019-05-24 MED ORDER — HYDROCODONE-ACETAMINOPHEN 5-325 MG PO TABS: 1.0000 | ORAL_TABLET | Freq: Four times a day (QID) | ORAL | 0 refills | Status: DC | PRN

## 2019-05-24 MED ORDER — GLIPIZIDE 5 MG PO TABS: 5.0000 mg | ORAL_TABLET | Freq: Every day | ORAL | 3 refills | Status: DC

## 2019-05-24 NOTE — Patient Instructions (Addendum)
.   Please review the attached list of medications and notify my office if there are any errors.   . Please contact your eyecare professional to schedule a routine eye exam  
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
## 2019-06-02 ENCOUNTER — Encounter (HOSPITAL_COMMUNITY): Payer: Self-pay

## 2019-06-02 ENCOUNTER — Other Ambulatory Visit (HOSPITAL_COMMUNITY): Payer: Self-pay | Admitting: Internal Medicine

## 2019-06-02 ENCOUNTER — Other Ambulatory Visit: Payer: Self-pay

## 2019-06-02 ENCOUNTER — Ambulatory Visit (HOSPITAL_COMMUNITY)
Admission: RE | Admit: 2019-06-02 | Discharge: 2019-06-02 | Disposition: A | Discharge status: Home / Self Care | Payer: Self-pay | Source: Ambulatory Visit | Attending: Cardiology | Admitting: Cardiology

## 2019-06-02 VITALS — BP 118/72 | HR 70 | Ht 68.0 in | Wt 151.0 lb

## 2019-06-02 DIAGNOSIS — Z7982 Long term (current) use of aspirin: Secondary | ICD-10-CM | POA: Insufficient documentation

## 2019-06-02 DIAGNOSIS — I5022 Chronic systolic (congestive) heart failure: Secondary | ICD-10-CM | POA: Insufficient documentation

## 2019-06-02 DIAGNOSIS — E785 Hyperlipidemia, unspecified: Secondary | ICD-10-CM | POA: Insufficient documentation

## 2019-06-02 DIAGNOSIS — G8929 Other chronic pain: Secondary | ICD-10-CM | POA: Insufficient documentation

## 2019-06-02 DIAGNOSIS — I255 Ischemic cardiomyopathy: Secondary | ICD-10-CM | POA: Insufficient documentation

## 2019-06-02 DIAGNOSIS — Z7984 Long term (current) use of oral hypoglycemic drugs: Secondary | ICD-10-CM | POA: Insufficient documentation

## 2019-06-02 DIAGNOSIS — I5020 Unspecified systolic (congestive) heart failure: Secondary | ICD-10-CM

## 2019-06-02 DIAGNOSIS — E119 Type 2 diabetes mellitus without complications: Secondary | ICD-10-CM | POA: Insufficient documentation

## 2019-06-02 DIAGNOSIS — I2510 Atherosclerotic heart disease of native coronary artery without angina pectoris: Secondary | ICD-10-CM | POA: Insufficient documentation

## 2019-06-02 DIAGNOSIS — Z993 Dependence on wheelchair: Secondary | ICD-10-CM | POA: Insufficient documentation

## 2019-06-02 DIAGNOSIS — Z951 Presence of aortocoronary bypass graft: Secondary | ICD-10-CM | POA: Insufficient documentation

## 2019-06-02 DIAGNOSIS — Z7902 Long term (current) use of antithrombotics/antiplatelets: Secondary | ICD-10-CM | POA: Insufficient documentation

## 2019-06-02 DIAGNOSIS — Z87828 Personal history of other (healed) physical injury and trauma: Secondary | ICD-10-CM | POA: Insufficient documentation

## 2019-06-02 DIAGNOSIS — Z79899 Other long term (current) drug therapy: Secondary | ICD-10-CM | POA: Insufficient documentation

## 2019-06-02 LAB — BASIC METABOLIC PANEL
Anion gap: 13 (ref 5–15)
BUN: 22 mg/dL (ref 8–23)
CO2: 23 mmol/L (ref 22–32)
Calcium: 9.4 mg/dL (ref 8.9–10.3)
Chloride: 100 mmol/L (ref 98–111)
Creatinine, Ser: 1.03 mg/dL (ref 0.61–1.24)
GFR calc Af Amer: >60 mL/min (ref >60)
GFR calc non Af Amer: >60 mL/min (ref >60)
Glucose, Bld: 157 mg/dL — ABNORMAL HIGH (ref 70–99)
Potassium: 5.8 mmol/L — ABNORMAL HIGH (ref 3.5–5.1)
Sodium: 136 mmol/L (ref 135–145)

## 2019-06-02 MED ORDER — FUROSEMIDE 20 MG PO TABS: 20.0000 mg | ORAL_TABLET | Freq: Every day | ORAL | 6 refills | Status: DC

## 2019-06-02 MED ORDER — METOPROLOL SUCCINATE ER 25 MG PO TB24: 25.0000 mg | ORAL_TABLET | Freq: Every day | ORAL | 11 refills | Status: DC
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
## 2019-06-02 NOTE — Patient Instructions (Signed)
It was a pleasure seeing you today!  MEDICATIONS: -We are changing your medications today -Start taking metoprolol succinate (Toprol-XL) 25mg  (1 tablet) by mouth daily -Stop taking metoprolol tartrate 12.5 mg twice daily -Start taking furosemide (Lasix) 20mg  (1 tablet) by mouth daily again -Call if you have questions about your medications.  LABS: -We will call you if your labs need attention.  NEXT APPOINTMENT: Return to clinic in 3 weeks with pharmacy.  In general, to take care of your heart failure: -Limit your fluid intake to 2 Liters (half-gallon) per day.   -Limit your salt intake to ideally 2-3 grams (2000-3000 mg) per day. -Weigh yourself daily and record, and bring that "weight diary" to your next appointment.  (Weight gain of 2-3 pounds in 1 day typically means fluid weight.) -The medications for your heart are to help your heart and help you live longer.   -Please contact before stopping any of your heart medications.  Call the clinic at [phone] with questions or to reschedule future appointments.

## 2019-06-03 ENCOUNTER — Ambulatory Visit (HOSPITAL_COMMUNITY)
Admission: RE | Admit: 2019-06-03 | Discharge: 2019-06-03 | Disposition: A | Discharge status: Home / Self Care | Payer: Self-pay | Source: Ambulatory Visit | Attending: Cardiology | Admitting: Cardiology

## 2019-06-03 ENCOUNTER — Other Ambulatory Visit (HOSPITAL_COMMUNITY): Payer: Self-pay

## 2019-06-03 DIAGNOSIS — I5022 Chronic systolic (congestive) heart failure: Secondary | ICD-10-CM

## 2019-06-03 LAB — BASIC METABOLIC PANEL
Anion gap: 9 (ref 5–15)
BUN: 19 mg/dL (ref 8–23)
CO2: 24 mmol/L (ref 22–32)
Calcium: 9.2 mg/dL (ref 8.9–10.3)
Chloride: 103 mmol/L (ref 98–111)
Creatinine, Ser: 0.99 mg/dL (ref 0.61–1.24)
GFR calc Af Amer: >60 mL/min (ref >60)
GFR calc non Af Amer: >60 mL/min (ref >60)
Glucose, Bld: 152 mg/dL — ABNORMAL HIGH (ref 70–99)
Potassium: 4.6 mmol/L (ref 3.5–5.1)
Sodium: 136 mmol/L (ref 135–145)

## 2019-06-03 NOTE — Addendum Note (Signed)
Addended by: Marisa Hua on: 06/03/2019 11:24 AM   Modules accepted: Orders

## 2019-06-17 ENCOUNTER — Other Ambulatory Visit (HOSPITAL_COMMUNITY): Payer: Self-pay | Admitting: Cardiology
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
## 2019-06-21 LAB — HM DIABETES EYE EXAM

## 2019-06-23 ENCOUNTER — Other Ambulatory Visit: Payer: Self-pay

## 2019-06-23 ENCOUNTER — Other Ambulatory Visit (HOSPITAL_COMMUNITY): Payer: Self-pay | Admitting: Internal Medicine

## 2019-06-23 ENCOUNTER — Ambulatory Visit (HOSPITAL_COMMUNITY)
Admission: RE | Admit: 2019-06-23 | Discharge: 2019-06-23 | Disposition: A | Discharge status: Home / Self Care | Payer: Self-pay | Source: Ambulatory Visit | Attending: Cardiology | Admitting: Cardiology

## 2019-06-23 DIAGNOSIS — Z7982 Long term (current) use of aspirin: Secondary | ICD-10-CM | POA: Insufficient documentation

## 2019-06-23 DIAGNOSIS — I5020 Unspecified systolic (congestive) heart failure: Secondary | ICD-10-CM

## 2019-06-23 DIAGNOSIS — Z7902 Long term (current) use of antithrombotics/antiplatelets: Secondary | ICD-10-CM | POA: Insufficient documentation

## 2019-06-23 DIAGNOSIS — G8929 Other chronic pain: Secondary | ICD-10-CM | POA: Insufficient documentation

## 2019-06-23 DIAGNOSIS — I5022 Chronic systolic (congestive) heart failure: Secondary | ICD-10-CM | POA: Insufficient documentation

## 2019-06-23 DIAGNOSIS — E785 Hyperlipidemia, unspecified: Secondary | ICD-10-CM | POA: Insufficient documentation

## 2019-06-23 DIAGNOSIS — E119 Type 2 diabetes mellitus without complications: Secondary | ICD-10-CM | POA: Insufficient documentation

## 2019-06-23 DIAGNOSIS — Z993 Dependence on wheelchair: Secondary | ICD-10-CM | POA: Insufficient documentation

## 2019-06-23 DIAGNOSIS — I428 Other cardiomyopathies: Secondary | ICD-10-CM | POA: Insufficient documentation

## 2019-06-23 DIAGNOSIS — Z79899 Other long term (current) drug therapy: Secondary | ICD-10-CM | POA: Insufficient documentation

## 2019-06-23 DIAGNOSIS — I2510 Atherosclerotic heart disease of native coronary artery without angina pectoris: Secondary | ICD-10-CM | POA: Insufficient documentation

## 2019-06-23 DIAGNOSIS — Z7984 Long term (current) use of oral hypoglycemic drugs: Secondary | ICD-10-CM | POA: Insufficient documentation

## 2019-06-23 DIAGNOSIS — Z951 Presence of aortocoronary bypass graft: Secondary | ICD-10-CM | POA: Insufficient documentation

## 2019-06-23 MED ORDER — METOPROLOL SUCCINATE ER 50 MG PO TB24: 50.0000 mg | ORAL_TABLET | Freq: Every day | ORAL | 11 refills | Status: DC

## 2019-06-23 MED ORDER — DAPAGLIFLOZIN PROPANEDIOL 10 MG PO TABS: 10.0000 mg | ORAL_TABLET | Freq: Every day | ORAL | 11 refills | Status: DC

## 2019-06-23 MED FILL — METOPROLOL SUCCINATE ER 50: 50 | 30 days supply | Qty: 30 | Fill #0

## 2019-06-23 MED FILL — FARXIGA 10 MG TABLET: 10 | 30 days supply | Qty: 30 | Fill #0

## 2019-06-23 NOTE — Patient Instructions (Signed)
It was a pleasure seeing you today!  MEDICATIONS: -We are changing your medications today -Increase metoprolol succinate to 50 mg (1 tablet) daily. -Call if you have questions about your medications.   NEXT APPOINTMENT: Return to clinic in 3 weeks with Dr. Gala Romney.  In general, to take care of your heart failure: -Limit your fluid intake to 2 Liters (half-gallon) per day.   -Limit your salt intake to ideally 2-3 grams (2000-3000 mg) per day. -Weigh yourself daily and record, and bring that "weight diary" to your next appointment.  (Weight gain of 2-3 pounds in 1 day typically means fluid weight.) -The medications for your heart are to help your heart and help you live longer.   -Please contact us before stopping any of your heart medications.  Call the clinic at [phone] with questions or to reschedule future appointments.

## 2019-06-28 ENCOUNTER — Other Ambulatory Visit: Payer: Self-pay | Admitting: Family Medicine

## 2019-06-28 ENCOUNTER — Ambulatory Visit (INDEPENDENT_AMBULATORY_CARE_PROVIDER_SITE_OTHER): Payer: Self-pay | Admitting: Physician Assistant

## 2019-06-28 ENCOUNTER — Other Ambulatory Visit: Payer: Self-pay

## 2019-06-28 ENCOUNTER — Encounter: Payer: Self-pay | Admitting: Physician Assistant

## 2019-06-28 VITALS — BP 128/76 | HR 72 | Temp 96.9°F

## 2019-06-28 DIAGNOSIS — M79604 Pain in right leg: Secondary | ICD-10-CM

## 2019-06-28 DIAGNOSIS — E119 Type 2 diabetes mellitus without complications: Secondary | ICD-10-CM

## 2019-06-28 LAB — POCT GLYCOSYLATED HEMOGLOBIN (HGB A1C)
Est. average glucose Bld gHb Est-mCnc: 154
Hemoglobin A1C: 7 % — AB (ref 4.0–5.6)

## 2019-06-28 MED ORDER — GLIPIZIDE 10 MG PO TABS: 10.0000 mg | ORAL_TABLET | Freq: Two times a day (BID) | ORAL | 0 refills | Status: DC

## 2019-06-28 MED ORDER — HYDROCODONE-ACETAMINOPHEN 5-325 MG PO TABS: 1.0000 | ORAL_TABLET | Freq: Four times a day (QID) | ORAL | 0 refills | Status: DC | PRN

## 2019-06-28 NOTE — Progress Notes (Signed)
Established patient visit   Patient: Curtis [name]   DOB: 12-13-1955   64 y.o. Male  MRN: [id number] Visit Date: 06/28/2019  Today's healthcare provider: Trey Sailors, PA-C   Chief Complaint  Patient presents with  . Diabetes  I,Porsha C McClurkin,acting as a scribe for [employer], PA-C.,have documented all relevant documentation on the behalf of Trey Sailors, PA-C,as directed by  Trey Sailors, PA-C while in the presence of Trey Sailors, PA-C.  Subjective    HPI Diabetes Mellitus Type II, Follow-up  Lab Results  Component Value Date   HGBA1C 7.3 (A) 05/24/2019   HGBA1C 7.7 (H) 03/25/2019   HGBA1C 7.0 (H) 12/27/2018   Wt Readings from Last 3 Encounters:  06/23/19 156 lb (70.8 kg)  06/02/19 151 lb (68.5 kg)  05/11/19 155 lb 3.2 oz (70.4 kg)    Last seen for diabetes 1 months ago. Initially diagnosed 02/2018 with A1c >14 and was started on glipizide, has never been on metformin. Currently taking farxiga 10 mg daily, started by his cardiologist, and glipizide 5 mg.  Management since then includes no changes. He reports good compliance with treatment. He is not having side effects.  Symptoms: No fatigue No foot ulcerations  No appetite changes No nausea  No paresthesia of the feet  No polydipsia  No polyuria No visual disturbances   No vomiting     Home blood sugar records: fasting range: 190's-200's  Episodes of hypoglycemia? No    Current insulin regiment: None Most Recent Eye Exam: 06/21/2019. He has a history of severe non-proliferative diabetic retinopathy. He was seen by [facility] after he had black spots in his vision and sent urgently over to [facility] where he was found to have a vitreal hemorrhage. He underwent PRP and is supposed to return in 2 months for follow up.  Current exercise: no regular exercise Current diet habits: well balanced  Pertinent Labs: Lab Results  Component Value Date   CHOL 149 03/26/2019   HDL 21 (L) 03/26/2019     LDLCALC 69 03/26/2019   TRIG 296 (H) 03/26/2019   CHOLHDL 7.1 03/26/2019   Lab Results  Component Value Date   NA 136 06/03/2019   K 4.6 06/03/2019   CREATININE 0.99 06/03/2019   GFRNONAA >60 06/03/2019   GFRAA >60 06/03/2019   GLUCOSE 152 (H) 06/03/2019     ---------------------------------------------------------------------------------------------------      Medications: Outpatient Medications Prior to Visit  Medication Sig  . acetaminophen (TYLENOL) 500 MG tablet Take 2 tablets (1,000 mg total) by mouth every 6 (six) hours.  Marland Kitchen aspirin 81 MG chewable tablet Chew 1 tablet (81 mg total) by mouth daily.  Marland Kitchen atorvastatin (LIPITOR) 40 MG tablet Take 1 tablet (40 mg total) by mouth daily at 6 PM.  . clopidogrel (PLAVIX) 75 MG tablet Take 1 tablet (75 mg total) by mouth daily.  . dapagliflozin propanediol (FARXIGA) 10 MG TABS tablet Take 1 tablet (10 mg total) by mouth daily before breakfast.  . digoxin (LANOXIN) 0.125 MG tablet Take 1 tablet (0.125 mg total) by mouth daily.  . furosemide (LASIX) 20 MG tablet Take 1 tablet (20 mg total) by mouth daily.  Marland Kitchen glipiZIDE (GLUCOTROL) 5 MG tablet Take 1 tablet (5 mg total) by mouth daily before breakfast.  . HYDROcodone-acetaminophen (NORCO/VICODIN) 5-325 MG tablet Take 1 tablet by mouth every 6 (six) hours as needed for moderate pain.  Demetra [employer] MISC Use to check blood sugar twice  a day  . metoprolol succinate (TOPROL-XL) 50 MG 24 hr tablet Take 1 tablet (50 mg total) by mouth daily.  . sacubitril-valsartan (ENTRESTO) 97-103 MG Take 1 tablet by mouth 2 (two) times daily.  Marland Kitchen spironolactone (ALDACTONE) 25 MG tablet Take 1 tablet (25 mg total) by mouth daily.   No facility-administered medications prior to visit.    Review of Systems  Constitutional: Negative.   Respiratory: Negative.   Cardiovascular: Negative.   Hematological: Negative.       Objective    BP 128/76 (BP Location: Left Arm, Patient Position: Sitting, Cuff  Size: Normal)   Pulse 72   Temp (!) 96.9 F (36.1 C) (Temporal)   SpO2 98%    Physical Exam Constitutional:      Appearance: Normal appearance.  Cardiovascular:     Rate and Rhythm: Normal rate and regular rhythm.     Heart sounds: Normal heart sounds.  Pulmonary:     Effort: Pulmonary effort is normal.     Breath sounds: Normal breath sounds.  Skin:    General: Skin is warm and dry.  Neurological:     General: No focal deficit present.     Mental Status: He is alert and oriented to person, place, and time.  Psychiatric:        Mood and Affect: Mood normal.        Behavior: Behavior normal.       No results found for any visits on 06/28/19.  Assessment & Plan    1. Controlled type 2 diabetes mellitus without complication, without long-term current use of insulin (HCC)  Fasting Blood sugars in the 200's. A1c today is 7.0%. May be outliers or may be start of a trend we are too early to catch. Increase glipizide to 10 mg, may use his old prescription up and take two 5 mg glipizide tablets daily. If not effective at lowering A1c, can consider metformin as he has no contraindications. Eye exam abstracted today. He follows up with his PCP on 10/2019.  - POCT glycosylated hemoglobin (Hb A1C) - glipiZIDE (GLUCOTROL) 10 MG tablet; Take 1 tablet (10 mg total) by mouth 2 (two) times daily before a meal.  Dispense: 90 tablet; Refill: 0     I, Trinna Post, PA-C, have reviewed all documentation for this visit. The documentation on 06/28/19 for the exam, diagnosis, procedures, and orders are all accurate and complete.    Paulene Floor  [facility] [phone] (phone) [phone] (fax)  [place]

## 2019-06-28 NOTE — Telephone Encounter (Signed)
Patient asking for refills on Hydrocodone  To be sent to [facility] in [place].

## 2019-07-04 ENCOUNTER — Other Ambulatory Visit (HOSPITAL_COMMUNITY): Payer: Self-pay | Admitting: Cardiology

## 2019-07-04 MED FILL — FUROSEMIDE 20 MG TABS: 20 | 30 days supply | Qty: 30 | Fill #1

## 2019-07-05 ENCOUNTER — Other Ambulatory Visit (HOSPITAL_COMMUNITY): Payer: Self-pay | Admitting: Cardiology

## 2019-07-05 ENCOUNTER — Telehealth (HOSPITAL_COMMUNITY): Payer: Self-pay | Admitting: Pharmacist

## 2019-07-05 DIAGNOSIS — E782 Mixed hyperlipidemia: Secondary | ICD-10-CM

## 2019-07-05 DIAGNOSIS — I5022 Chronic systolic (congestive) heart failure: Secondary | ICD-10-CM

## 2019-07-05 DIAGNOSIS — I2510 Atherosclerotic heart disease of native coronary artery without angina pectoris: Secondary | ICD-10-CM

## 2019-07-05 MED ORDER — CLOPIDOGREL BISULFATE 75 MG PO TABS: 75.0000 mg | ORAL_TABLET | Freq: Every day | ORAL | 5 refills | Status: DC

## 2019-07-05 MED ORDER — DIGOXIN 125 MCG PO TABS: 0.1250 mg | ORAL_TABLET | Freq: Every day | ORAL | 5 refills | Status: DC

## 2019-07-05 MED ORDER — SPIRONOLACTONE 25 MG PO TABS: 25.0000 mg | ORAL_TABLET | Freq: Every day | ORAL | 11 refills | Status: DC

## 2019-07-05 MED ORDER — ATORVASTATIN CALCIUM 40 MG PO TABS: 40.0000 mg | ORAL_TABLET | Freq: Every day | ORAL | 5 refills | Status: DC

## 2019-07-05 MED FILL — ATORVASTATIN 40 MG TABLET: 40 | 30 days supply | Qty: 30 | Fill #0

## 2019-07-05 MED FILL — CLOPIDOGREL 75 MG TABLET: 75 | 30 days supply | Qty: 30 | Fill #0

## 2019-07-05 MED FILL — SPIRONOLACTONE 25 MG TABS: 25 | 30 days supply | Qty: 30 | Fill #0

## 2019-07-05 MED FILL — DIGOXIN 0.125 MG TABLET: 125 | 30 days supply | Qty: 30 | Fill #0

## 2019-07-05 NOTE — Telephone Encounter (Signed)
Refill for spironolactone sent to [facility].
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
## 2019-07-18 ENCOUNTER — Ambulatory Visit (HOSPITAL_COMMUNITY)
Admission: RE | Admit: 2019-07-18 | Discharge: 2019-07-18 | Disposition: A | Discharge status: Home / Self Care | Payer: Self-pay | Source: Ambulatory Visit | Attending: Internal Medicine | Admitting: Internal Medicine

## 2019-07-18 ENCOUNTER — Other Ambulatory Visit: Payer: Self-pay

## 2019-07-18 ENCOUNTER — Encounter (HOSPITAL_COMMUNITY): Payer: Self-pay | Admitting: Internal Medicine

## 2019-07-18 VITALS — BP 132/76 | HR 67

## 2019-07-18 DIAGNOSIS — Z79899 Other long term (current) drug therapy: Secondary | ICD-10-CM | POA: Insufficient documentation

## 2019-07-18 DIAGNOSIS — E119 Type 2 diabetes mellitus without complications: Secondary | ICD-10-CM | POA: Insufficient documentation

## 2019-07-18 DIAGNOSIS — I2510 Atherosclerotic heart disease of native coronary artery without angina pectoris: Secondary | ICD-10-CM | POA: Insufficient documentation

## 2019-07-18 DIAGNOSIS — Z87891 Personal history of nicotine dependence: Secondary | ICD-10-CM | POA: Insufficient documentation

## 2019-07-18 DIAGNOSIS — I5022 Chronic systolic (congestive) heart failure: Secondary | ICD-10-CM

## 2019-07-18 DIAGNOSIS — I110 Hypertensive heart disease with heart failure: Secondary | ICD-10-CM | POA: Insufficient documentation

## 2019-07-18 DIAGNOSIS — I255 Ischemic cardiomyopathy: Secondary | ICD-10-CM | POA: Insufficient documentation

## 2019-07-18 DIAGNOSIS — G8929 Other chronic pain: Secondary | ICD-10-CM | POA: Insufficient documentation

## 2019-07-18 DIAGNOSIS — Z993 Dependence on wheelchair: Secondary | ICD-10-CM | POA: Insufficient documentation

## 2019-07-18 DIAGNOSIS — Z7982 Long term (current) use of aspirin: Secondary | ICD-10-CM | POA: Insufficient documentation

## 2019-07-18 DIAGNOSIS — Z951 Presence of aortocoronary bypass graft: Secondary | ICD-10-CM | POA: Insufficient documentation

## 2019-07-18 DIAGNOSIS — Z7984 Long term (current) use of oral hypoglycemic drugs: Secondary | ICD-10-CM | POA: Insufficient documentation

## 2019-07-18 DIAGNOSIS — Z7902 Long term (current) use of antithrombotics/antiplatelets: Secondary | ICD-10-CM | POA: Insufficient documentation

## 2019-07-18 DIAGNOSIS — E785 Hyperlipidemia, unspecified: Secondary | ICD-10-CM | POA: Insufficient documentation

## 2019-07-18 DIAGNOSIS — Z885 Allergy status to narcotic agent status: Secondary | ICD-10-CM | POA: Insufficient documentation

## 2019-07-18 DIAGNOSIS — R531 Weakness: Secondary | ICD-10-CM | POA: Insufficient documentation

## 2019-07-18 LAB — BRAIN NATRIURETIC PEPTIDE: B Natriuretic Peptide: 213.2 pg/mL — ABNORMAL HIGH (ref 0.0–100.0)

## 2019-07-18 LAB — BASIC METABOLIC PANEL
Anion gap: 14 (ref 5–15)
BUN: 26 mg/dL — ABNORMAL HIGH (ref 8–23)
CO2: 24 mmol/L (ref 22–32)
Calcium: 9.4 mg/dL (ref 8.9–10.3)
Chloride: 99 mmol/L (ref 98–111)
Creatinine, Ser: 1.03 mg/dL (ref 0.61–1.24)
GFR calc Af Amer: >60 mL/min (ref >60)
GFR calc non Af Amer: >60 mL/min (ref >60)
Glucose, Bld: 216 mg/dL — ABNORMAL HIGH (ref 70–99)
Potassium: 4.9 mmol/L (ref 3.5–5.1)
Sodium: 137 mmol/L (ref 135–145)

## 2019-07-18 LAB — CBC
HCT: 37.4 % — ABNORMAL LOW (ref 39.0–52.0)
Hemoglobin: 11.8 g/dL — ABNORMAL LOW (ref 13.0–17.0)
MCH: 28.7 pg (ref 26.0–34.0)
MCHC: 31.6 g/dL (ref 30.0–36.0)
MCV: 91 fL (ref 80.0–100.0)
Platelets: 234 10*3/uL (ref 150–400)
RBC: 4.11 MIL/uL — ABNORMAL LOW (ref 4.22–5.81)
RDW: 15.2 % (ref 11.5–15.5)
WBC: 8.7 10*3/uL (ref 4.0–10.5)
nRBC: 0 % (ref 0.0–0.2)

## 2019-07-18 NOTE — Patient Instructions (Signed)
Labs done today, we will notify you of any abnormal results  Chest X-ray to be done today  Please call our office in December to schedule your follow up appointment  If you have any questions or concerns before your next appointment please send [place] a message through [place] or call our office at [phone].    TO LEAVE A MESSAGE FOR THE NURSE SELECT OPTION 2, PLEASE LEAVE A MESSAGE INCLUDING: . YOUR NAME . DATE OF BIRTH . CALL BACK NUMBER . REASON FOR CALL**this is important as we prioritize the call backs  YOU WILL RECEIVE A CALL BACK THE SAME DAY AS LONG AS YOU CALL BEFORE 4:00 PM  At the [facility], you and your health needs are our priority. As part of our continuing mission to provide you with exceptional heart care, we have created designated Provider Care Teams. These Care Teams include your primary Cardiologist (physician) and Advanced Practice Providers (APPs- Physician Assistants and Nurse Practitioners) who all work together to provide you with the care you need, when you need it.   You may see any of the following providers on your designated Care Team at your next follow up: Marland Kitchen Dr Arvilla Meres . Dr Marca Ancona . Tonye Becket, NP . Robbie Lis, PA . Karle Plumber, PharmD   Please be sure to bring in all your medications bottles to every appointment.

## 2019-07-21 MED FILL — FARXIGA 10 MG TABLET: 10 | 30 days supply | Qty: 30 | Fill #1

## 2019-07-21 MED FILL — METOPROLOL SUCCINATE ER 50: 50 | 30 days supply | Qty: 30 | Fill #1

## 2019-08-03 MED FILL — SPIRONOLACTONE 25 MG TABS: 25 | 30 days supply | Qty: 30 | Fill #1

## 2019-08-03 MED FILL — FUROSEMIDE 20 MG TABS: 20 | 30 days supply | Qty: 30 | Fill #2

## 2019-08-03 MED FILL — ATORVASTATIN 40 MG TABLET: 40 | 30 days supply | Qty: 30 | Fill #1

## 2019-08-03 MED FILL — CLOPIDOGREL 75 MG TABLET: 75 | 30 days supply | Qty: 30 | Fill #1

## 2019-08-03 MED FILL — DIGOXIN 0.125 MG TABLET: 125 | 30 days supply | Qty: 30 | Fill #1

## 2019-08-11 ENCOUNTER — Other Ambulatory Visit: Payer: Self-pay | Admitting: Physician Assistant

## 2019-08-11 DIAGNOSIS — E119 Type 2 diabetes mellitus without complications: Secondary | ICD-10-CM

## 2019-08-11 NOTE — Telephone Encounter (Signed)
   Requested medications are on the active medication list Not the same dose.   Notes to clinic Historical provider, dose does not match note of 6/8.

## 2019-08-11 NOTE — Telephone Encounter (Signed)
Please verify if ok to refill with instructions of 10mg  twice daily. It looks like this change was made during last office visit with Curtis [name] on 06/28/2019.  The directions in patients chart says once daily.

## 2019-08-22 MED FILL — FARXIGA 10 MG TABLET: 10 | 30 days supply | Qty: 30 | Fill #2

## 2019-08-22 MED FILL — METOPROLOL SUCCINATE ER 50: 50 | 30 days supply | Qty: 30 | Fill #2

## 2019-08-24 ENCOUNTER — Encounter: Payer: Self-pay | Admitting: Family Medicine

## 2019-08-26 ENCOUNTER — Other Ambulatory Visit: Payer: Self-pay | Admitting: Family Medicine

## 2019-08-26 DIAGNOSIS — G8929 Other chronic pain: Secondary | ICD-10-CM

## 2019-08-26 MED ORDER — HYDROCODONE-ACETAMINOPHEN 5-325 MG PO TABS: 1.0000 | ORAL_TABLET | Freq: Four times a day (QID) | ORAL | 0 refills | Status: DC | PRN

## 2019-08-26 NOTE — Telephone Encounter (Signed)
Medication: HYDROcodone-acetaminophen (NORCO/VICODIN) 5-325 MG tablet [[id number]]     Has the patient contacted their pharmacy? YES   (Agent: If no, request that the patient contact the pharmacy for the refill.)  (Agent: If yes, when and what did the pharmacy advise?)    Preferred Pharmacy (with phone number or street name):  Medication: HYDROcodone-acetaminophen (NORCO/VICODIN) 5-325 MG tablet [[id number]]     Has the patient contacted their pharmacy? YES   (Agent: If no, request that the patient contact the pharmacy for the refill.)  (Agent: If yes, when and what did the pharmacy advise?)    Preferred Pharmacy (with phone number or street name): [facility]    Agent: Please be advised that RX refills may take up to 3 business days. We ask that you follow-up with your pharmacy.       Agent: Please be advised that RX refills may take up to 3 business days. We ask that you follow-up with your pharmacy.

## 2019-08-26 NOTE — Telephone Encounter (Signed)
Requested medication (s) are due for refill today -yes  Requested medication (s) are on the active medication list -yes  Future visit scheduled -yes  Last refill: 06/28/19  Notes to clinic: Request for non delegated Rx  Requested Prescriptions  Pending Prescriptions Disp Refills   HYDROcodone-acetaminophen (NORCO/VICODIN) 5-325 MG tablet 30 tablet 0    Sig: Take 1 tablet by mouth every 6 (six) hours as needed for moderate pain.      Not Delegated - Analgesics:  Opioid Agonist Combinations Failed - 08/26/2019  9:41 AM      Failed - This refill cannot be delegated      Failed - Urine Drug Screen completed in last 360 days.      Passed - Valid encounter within last 6 months    Recent Outpatient Visits           1 month ago Controlled type 2 diabetes mellitus without complication, without long-term current use of insulin [facility])   [facility] [place], Ricki Rodriguez M, PA-C   3 months ago Controlled type 2 diabetes mellitus without complication, without long-term current use of insulin [facility])   [facility] Malva Limes, MD   8 months ago Controlled type 2 diabetes mellitus without complication, without long-term current use of insulin [facility])   [facility] Malva Limes, MD   12 months ago Controlled type 2 diabetes mellitus without complication, without long-term current use of insulin [facility])   [facility] Malva Limes, MD   1 year ago Uncontrolled type 2 diabetes mellitus with hyperglycemia [facility])   [facility] Malva Limes, MD       Future Appointments             In 2 months Fisher, Demetrios Isaacs, MD [facility], [facility]                Requested Prescriptions  Pending Prescriptions Disp Refills   HYDROcodone-acetaminophen (NORCO/VICODIN) 5-325 MG tablet 30 tablet 0    Sig: Take 1 tablet by mouth every 6 (six) hours as needed for moderate pain.      Not Delegated - Analgesics:  Opioid  Agonist Combinations Failed - 08/26/2019  9:41 AM      Failed - This refill cannot be delegated      Failed - Urine Drug Screen completed in last 360 days.      Passed - Valid encounter within last 6 months    Recent Outpatient Visits           1 month ago Controlled type 2 diabetes mellitus without complication, without long-term current use of insulin [facility])   [facility] [place], Ricki Rodriguez M, PA-C   3 months ago Controlled type 2 diabetes mellitus without complication, without long-term current use of insulin [facility])   [facility] Malva Limes, MD   8 months ago Controlled type 2 diabetes mellitus without complication, without long-term current use of insulin [facility])   [facility] Malva Limes, MD   12 months ago Controlled type 2 diabetes mellitus without complication, without long-term current use of insulin [facility])   [facility] Malva Limes, MD   1 year ago Uncontrolled type 2 diabetes mellitus with hyperglycemia [facility])   [facility] Malva Limes, MD       Future Appointments             In 2 months Fisher, Demetrios Isaacs, MD [facility],  [facility]

## 2019-09-01 MED FILL — ATORVASTATIN 40 MG TABLET: 40 | 30 days supply | Qty: 30 | Fill #2

## 2019-09-01 MED FILL — SPIRONOLACTONE 25 MG TABS: 25 | 30 days supply | Qty: 30 | Fill #2

## 2019-09-01 MED FILL — DIGOXIN 0.125 MG TABLET: 125 | 30 days supply | Qty: 30 | Fill #2

## 2019-09-01 MED FILL — FUROSEMIDE 20 MG TABS: 20 | 30 days supply | Qty: 30 | Fill #3

## 2019-09-01 MED FILL — CLOPIDOGREL 75 MG TABLET: 75 | 30 days supply | Qty: 30 | Fill #2

## 2019-09-20 MED FILL — FARXIGA 10 MG TABLET: 10 | 30 days supply | Qty: 30 | Fill #3

## 2019-09-20 MED FILL — METOPROLOL SUCCINATE ER 50: 50 | 30 days supply | Qty: 30 | Fill #3

## 2019-09-30 MED FILL — FUROSEMIDE 20 MG TABS: 20 | 30 days supply | Qty: 30 | Fill #4

## 2019-09-30 MED FILL — CLOPIDOGREL 75 MG TABLET: 75 | 30 days supply | Qty: 30 | Fill #3

## 2019-09-30 MED FILL — DIGOXIN 0.125 MG TABLET: 125 | 30 days supply | Qty: 30 | Fill #3

## 2019-09-30 MED FILL — SPIRONOLACTONE 25 MG TABS: 25 | 30 days supply | Qty: 30 | Fill #3

## 2019-09-30 MED FILL — ATORVASTATIN 40 MG TABLET: 40 | 30 days supply | Qty: 30 | Fill #3

## 2019-10-13 ENCOUNTER — Other Ambulatory Visit: Payer: Self-pay | Admitting: Family Medicine

## 2019-10-13 DIAGNOSIS — M79604 Pain in right leg: Secondary | ICD-10-CM

## 2019-10-13 NOTE — Telephone Encounter (Signed)
Pt needs a refill on his Hydrocodone 5/325  [facility] [place]

## 2019-10-17 MED ORDER — HYDROCODONE-ACETAMINOPHEN 5-325 MG PO TABS: 1.0000 | ORAL_TABLET | Freq: Four times a day (QID) | ORAL | 0 refills | Status: DC | PRN

## 2019-10-17 NOTE — Telephone Encounter (Signed)
Pt called stating that he is completely out of the medication. Please advise.

## 2019-10-17 NOTE — Telephone Encounter (Signed)
Please advise 

## 2019-10-19 MED FILL — METOPROLOL SUCCINATE ER 50: 50 | 30 days supply | Qty: 30 | Fill #4

## 2019-10-19 MED FILL — FARXIGA 10 MG TABLET: 10 | 30 days supply | Qty: 30 | Fill #4

## 2019-10-25 ENCOUNTER — Encounter: Payer: Self-pay | Admitting: Family Medicine

## 2019-10-25 ENCOUNTER — Ambulatory Visit (INDEPENDENT_AMBULATORY_CARE_PROVIDER_SITE_OTHER): Payer: Self-pay | Admitting: Family Medicine

## 2019-10-25 ENCOUNTER — Other Ambulatory Visit: Payer: Self-pay

## 2019-10-25 VITALS — BP 130/79 | HR 64 | Temp 98.1°F | Resp 16 | Wt 161.0 lb

## 2019-10-25 DIAGNOSIS — E1165 Type 2 diabetes mellitus with hyperglycemia: Secondary | ICD-10-CM

## 2019-10-25 DIAGNOSIS — R809 Proteinuria, unspecified: Secondary | ICD-10-CM

## 2019-10-25 DIAGNOSIS — Z23 Encounter for immunization: Secondary | ICD-10-CM

## 2019-10-25 LAB — POCT GLYCOSYLATED HEMOGLOBIN (HGB A1C)
Est. average glucose Bld gHb Est-mCnc: 214
Hemoglobin A1C: 9.1 % — AB (ref 4.0–5.6)

## 2019-10-25 LAB — POCT UA - MICROALBUMIN: Microalbumin Ur, POC: 50 mg/L

## 2019-10-25 MED ORDER — METFORMIN HCL ER 500 MG PO TB24: ORAL_TABLET | ORAL | 1 refills | Status: DC

## 2019-10-25 MED ORDER — GLIPIZIDE 10 MG PO TABS: 10.0000 mg | ORAL_TABLET | Freq: Every day | ORAL | 1 refills | Status: DC

## 2019-10-25 NOTE — Progress Notes (Signed)
Established patient visit   Patient: Curtis [name]   DOB: February 03, 1955   64 y.o. Male  MRN: [id number] Visit Date: 10/25/2019  Today's healthcare provider: Mila Merry, MD   Chief Complaint  Patient presents with  . Diabetes   Subjective    HPI  Diabetes Mellitus Type II, Follow-up  Lab Results  Component Value Date   HGBA1C 7.0 (A) 06/28/2019   HGBA1C 7.3 (A) 05/24/2019   HGBA1C 7.7 (H) 03/25/2019   Wt Readings from Last 3 Encounters:  10/25/19 161 lb (73 kg)  06/23/19 156 lb (70.8 kg)  06/02/19 151 lb (68.5 kg)   Last seen for diabetes 4 months ago (seen by Osvaldo Angst, PA-C) Management since then includes increasing Glipizide to 10mg  daily. He reports good compliance with treatment. He state when he went for refill the pharmacy gave him 5mg  tablets, so he is taking 2 of them every morning.  He is not having side effects.  Symptoms: No fatigue No foot ulcerations  No appetite changes No nausea  No paresthesia of the feet  No polydipsia  No polyuria No visual disturbances   No vomiting     Home blood sugar records: fasting range: 200-250  Episodes of hypoglycemia? No    Current insulin regiment: none Most Recent Eye Exam: UTD Current exercise: none Current diet habits: well balanced  Pertinent Labs: Lab Results  Component Value Date   CHOL 149 03/26/2019   HDL 21 (L) 03/26/2019   LDLCALC 69 03/26/2019   TRIG 296 (H) 03/26/2019   CHOLHDL 7.1 03/26/2019   Lab Results  Component Value Date   NA 137 07/18/2019   K 4.9 07/18/2019   CREATININE 1.03 07/18/2019   GFRNONAA >60 07/18/2019   GFRAA >60 07/18/2019   GLUCOSE 216 (H) 07/18/2019     ---------------------------------------------------------------------------------------------------     Medications: Outpatient Medications Prior to Visit  Medication Sig  . acetaminophen (TYLENOL) 500 MG tablet Take 2 tablets (1,000 mg total) by mouth every 6 (six) hours.  07/20/2019 aspirin 81 MG chewable  tablet Chew 1 tablet (81 mg total) by mouth daily.  07/20/2019 atorvastatin (LIPITOR) 40 MG tablet Take 1 tablet (40 mg total) by mouth daily at 6 PM.  . clopidogrel (PLAVIX) 75 MG tablet Take 1 tablet (75 mg total) by mouth daily.  . dapagliflozin propanediol (FARXIGA) 10 MG TABS tablet Take 1 tablet (10 mg total) by mouth daily before breakfast.  . digoxin (LANOXIN) 0.125 MG tablet Take 1 tablet (0.125 mg total) by mouth daily.  . furosemide (LASIX) 20 MG tablet Take 1 tablet (20 mg total) by mouth daily.  Marland Kitchen glipiZIDE (GLUCOTROL) 10 MG tablet TAKE 1 TABLET(10 MG) BY MOUTH TWICE DAILY BEFORE A MEAL (Patient taking differently: Take 10 mg by mouth daily before breakfast. )  . HYDROcodone-acetaminophen (NORCO/VICODIN) 5-325 MG tablet Take 1 tablet by mouth every 6 (six) hours as needed for moderate pain.  Marland Kitchen Devices MISC Use to check blood sugar twice a day  . metoprolol succinate (TOPROL-XL) 50 MG 24 hr tablet Take 1 tablet (50 mg total) by mouth daily.  . sacubitril-valsartan (ENTRESTO) 97-103 MG Take 1 tablet by mouth 2 (two) times daily.  Marland Kitchen spironolactone (ALDACTONE) 25 MG tablet Take 1 tablet (25 mg total) by mouth daily.   No facility-administered medications prior to visit.       Objective    BP 130/79 (BP Location: Left Arm, Patient Position: Sitting, Cuff Size: Normal)   Pulse 64  Temp 98.1 F (36.7 C) (Oral)   Resp 16   Wt 161 lb (73 kg)   BMI 24.48 kg/m    Physical Exam  General appearance: Well developed, well nourished male, cooperative and in no acute distress Head: Normocephalic, without obvious abnormality, atraumatic Respiratory: Respirations even and unlabored, normal respiratory rate Extremities: All extremities are intact.  Skin: Skin color, texture, turgor normal. No rashes seen  Psych: Appropriate mood and affect. Neurologic: Mental status: Alert, oriented to person, place, and time, thought content appropriate.   Results for orders placed or performed in  visit on 10/25/19  POCT HgB A1C  Result Value Ref Range   Hemoglobin A1C 9.1 (A) 4.0 - 5.6 %   Est. average glucose Bld gHb Est-mCnc 214   POCT UA - Microalbumin  Result Value Ref Range   Microalbumin Ur, POC 50 mg/L    Assessment & Plan     1. Uncontrolled type 2 diabetes mellitus with hyperglycemia (HCC) Refill - glipiZIDE (GLUCOTROL) 10 MG tablet; Take 1 tablet (10 mg total) by mouth daily before breakfast.  Dispense: 90 tablet; Refill: 1 Continue [place].   Add  metFORMIN (GLUCOPHAGE XR) 500 MG 24 hr tablet; t  Dispense: 60 tablet; Refill: 1. One daily for two weeks, then increase to 2 daily.   3. Need for influenza vaccination  - Flu Vaccine QUAD High Dose IM (Fluad)  4. Microalbuminuria Is on SGLT-2 inhibitor. Plan on rechecking microalbuminuria when sugar is better controlled. Consider ACEI  Future Appointments  Date Time Provider Department Center  12/09/2019 11:00 AM Sherrie Mustache, Demetrios Isaacs, MD [facility]         The entirety of the information documented in the History of Present Illness, Review of Systems and Physical Exam were personally obtained by me. Portions of this information were initially documented by the CMA and reviewed by me for thoroughness and accuracy.      Mila Merry, MD  [facility] [phone] (phone) [phone] (fax)  [facility]

## 2019-11-01 MED FILL — SPIRONOLACTONE 25 MG TABS: 25 | 30 days supply | Qty: 30 | Fill #4

## 2019-11-01 MED FILL — FUROSEMIDE 20 MG TABS: 20 | 30 days supply | Qty: 30 | Fill #5

## 2019-11-01 MED FILL — CLOPIDOGREL 75 MG TABLET: 75 | 30 days supply | Qty: 30 | Fill #4

## 2019-11-01 MED FILL — ATORVASTATIN 40 MG TABLET: 40 | 30 days supply | Qty: 30 | Fill #4

## 2019-11-01 MED FILL — DIGOXIN 0.125 MG TABLET: 125 | 30 days supply | Qty: 30 | Fill #4

## 2019-11-14 ENCOUNTER — Ambulatory Visit: Payer: Self-pay | Admitting: *Deleted

## 2019-11-14 NOTE — Telephone Encounter (Signed)
Metformin 500 mg increased to 2 tabs every evening on 11/08/19. He is having diarrhea 3-4 times during the night every Qnight since. Episodes increased to every hour last night. Denies all signs of dehydration and drinking a lot of water and tolerating foods. No abdominal pain/cramping. Suggested probiotic supplement and imodium for the next 2 days as well as a barrier cream for his skin while the diarrhea persists. Discussed it may take more time for his body to adjust to the increase in metformin. Fasting blood sugars for the last 3 days:225, 200 yesterday and 198 today compared to 240-250's prior to the increase.  Wife is requesting feedback from pcp.

## 2019-11-14 NOTE — Telephone Encounter (Signed)
Have him put metformin on hold until bowel go back to normal, then resume one tablet daily. Do not go back up to 2 daily.

## 2019-11-14 NOTE — Telephone Encounter (Signed)
Pt's given recommendation per Dr Mila Merry, "Have him put metformin on hold until bowel go back to normal, then resume one tablet daily. Do not go back up to 2 daily"; she verbalized understanding; will route to office for notification.

## 2019-11-14 NOTE — Telephone Encounter (Signed)
Tried calling patients wife. Left message to call back. OK for [facility] triage to advise of message below.

## 2019-11-18 MED FILL — METOPROLOL SUCCINATE ER 50: 50 | 30 days supply | Qty: 30 | Fill #5

## 2019-11-18 MED FILL — FARXIGA 10 MG TABLET: 10 | 30 days supply | Qty: 30 | Fill #5

## 2019-12-02 MED FILL — FUROSEMIDE 20 MG TABS: 20 | 30 days supply | Qty: 30 | Fill #6

## 2019-12-02 MED FILL — SPIRONOLACTONE 25 MG TABS: 25 | 30 days supply | Qty: 30 | Fill #5

## 2019-12-02 MED FILL — DIGOXIN 0.125 MG TABLET: 125 | 30 days supply | Qty: 30 | Fill #5

## 2019-12-02 MED FILL — CLOPIDOGREL 75 MG TABLET: 75 | 30 days supply | Qty: 30 | Fill #5

## 2019-12-02 MED FILL — ATORVASTATIN 40 MG TABLET: 40 | 30 days supply | Qty: 30 | Fill #5

## 2019-12-09 ENCOUNTER — Ambulatory Visit (INDEPENDENT_AMBULATORY_CARE_PROVIDER_SITE_OTHER): Payer: Self-pay | Admitting: Family Medicine

## 2019-12-09 ENCOUNTER — Encounter: Payer: Self-pay | Admitting: Family Medicine

## 2019-12-09 ENCOUNTER — Other Ambulatory Visit: Payer: Self-pay

## 2019-12-09 VITALS — BP 151/84 | HR 65 | Temp 98.0°F | Resp 16

## 2019-12-09 DIAGNOSIS — I2510 Atherosclerotic heart disease of native coronary artery without angina pectoris: Secondary | ICD-10-CM

## 2019-12-09 DIAGNOSIS — E1165 Type 2 diabetes mellitus with hyperglycemia: Secondary | ICD-10-CM

## 2019-12-09 DIAGNOSIS — Z23 Encounter for immunization: Secondary | ICD-10-CM

## 2019-12-09 LAB — POCT GLYCOSYLATED HEMOGLOBIN (HGB A1C)
Est. average glucose Bld gHb Est-mCnc: 206
Hemoglobin A1C: 8.8 % — AB (ref 4.0–5.6)

## 2019-12-09 MED ORDER — METFORMIN HCL ER 500 MG PO TB24: 500.0000 mg | ORAL_TABLET | Freq: Every day | ORAL | Status: DC

## 2019-12-09 NOTE — Progress Notes (Signed)
Established patient visit   Patient: Curtis [name]   DOB: Dec 10, 1955   64 y.o. Male  MRN: [id number] Visit Date: 12/09/2019  Today's healthcare provider: Mila Merry, MD   Chief Complaint  Patient presents with  . Diabetes   Subjective    HPI  Diabetes Mellitus Type II, Follow-up  Lab Results  Component Value Date   HGBA1C 8.8 (A) 12/09/2019   HGBA1C 9.1 (A) 10/25/2019   HGBA1C 7.0 (A) 06/28/2019   Wt Readings from Last 3 Encounters:  10/25/19 161 lb (73 kg)  06/23/19 156 lb (70.8 kg)  06/02/19 151 lb (68.5 kg)   Last seen for diabetes 6 weeks ago.  Management since then includes adding Metformin 500mg  1 tablet daily X 2 weeks, then increase to 2 tablets (1000mg ) daily. He reports good compliance with treatment. He is having side effects. Patient was not able to tolerate taking 2 tablets of Metformin daily due to it causing abdominal pain and diarrhea. Patient cut back to taking one tablet daily of Metformin. Since then diarrhea and abdominal pain have resolved.  Symptoms: Yes fatigue No foot ulcerations  No appetite changes No nausea  No paresthesia of the feet  Yes polydipsia  No polyuria No visual disturbances   No vomiting     Home blood sugar records: fasting range: 170-180  Episodes of hypoglycemia? No    Current insulin regiment: none Most Recent Eye Exam: 06/21/2019 Current exercise: none Current diet habits: well balanced  Pertinent Labs: Lab Results  Component Value Date   CHOL 149 03/26/2019   HDL 21 (L) 03/26/2019   LDLCALC 69 03/26/2019   TRIG 296 (H) 03/26/2019   CHOLHDL 7.1 03/26/2019   Lab Results  Component Value Date   NA 137 07/18/2019   K 4.9 07/18/2019   CREATININE 1.03 07/18/2019   GFRNONAA >60 07/18/2019   GFRAA >60 07/18/2019   GLUCOSE 216 (H) 07/18/2019         Medications: Outpatient Medications Prior to Visit  Medication Sig  . acetaminophen (TYLENOL) 500 MG tablet Take 2 tablets (1,000 mg total) by mouth  every 6 (six) hours.  07/20/2019 aspirin 81 MG chewable tablet Chew 1 tablet (81 mg total) by mouth daily.  07/20/2019 atorvastatin (LIPITOR) 40 MG tablet Take 1 tablet (40 mg total) by mouth daily at 6 PM.  . clopidogrel (PLAVIX) 75 MG tablet Take 1 tablet (75 mg total) by mouth daily.  . dapagliflozin propanediol (FARXIGA) 10 MG TABS tablet Take 1 tablet (10 mg total) by mouth daily before breakfast.  . digoxin (LANOXIN) 0.125 MG tablet Take 1 tablet (0.125 mg total) by mouth daily.  . furosemide (LASIX) 20 MG tablet Take 1 tablet (20 mg total) by mouth daily.  Marland Kitchen glipiZIDE (GLUCOTROL) 10 MG tablet Take 1 tablet (10 mg total) by mouth daily before breakfast.  . HYDROcodone-acetaminophen (NORCO/VICODIN) 5-325 MG tablet Take 1 tablet by mouth every 6 (six) hours as needed for moderate pain.  Marland Kitchen Devices MISC Use to check blood sugar twice a day  . metFORMIN (GLUCOPHAGE XR) 500 MG 24 hr tablet Take one tablet every evening for 2 weeks, then increase to two tablets every evening (Patient taking differently: Take 500 mg by mouth daily with breakfast. Take one tablet every evening for 2 weeks, then increase to two tablets every evening)  . metoprolol succinate (TOPROL-XL) 50 MG 24 hr tablet Take 1 tablet (50 mg total) by mouth daily.  . sacubitril-valsartan (ENTRESTO) 97-103 MG Take  1 tablet by mouth 2 (two) times daily.  Marland Kitchen spironolactone (ALDACTONE) 25 MG tablet Take 1 tablet (25 mg total) by mouth daily.   No facility-administered medications prior to visit.    Review of Systems  Constitutional: Negative.   Respiratory: Negative.   Cardiovascular: Negative.   Gastrointestinal: Positive for diarrhea (resolved after decreasing Metformin).  Endocrine: Negative.   Musculoskeletal: Negative.   Neurological: Negative.   Psychiatric/Behavioral: Positive for sleep disturbance (trouble staying asleep).      Objective    BP (!) 151/84 (BP Location: Left Arm, Patient Position: Sitting, Cuff Size: Normal)    Pulse 65   Temp 98 F (36.7 C) (Oral)   Resp 16    Physical Exam   General: Appearance:    Well developed, well nourished male in no acute distress  Eyes:    PERRL, conjunctiva/corneas clear, EOM's intact       Lungs:     Clear to auscultation bilaterally, respirations unlabored  Heart:    Normal heart rate. Normal rhythm. No murmurs, rubs, or gallops.   MS:   All extremities are intact.   Neurologic:   Awake, alert, oriented x 3.         Results for orders placed or performed in visit on 12/09/19  POCT HgB A1C  Result Value Ref Range   Hemoglobin A1C 8.8 (A) 4.0 - 5.6 %   Est. average glucose Bld gHb Est-mCnc 206     Assessment & Plan     1. Uncontrolled type 2 diabetes mellitus with hyperglycemia (HCC) Did not tolerate 1000mg  metformin, but is tolerating the 500mg . a1c slightly improved over last 6 weeks. Continue current medications.  Recheck in 3 months. Discussed adding Victoza if not continuing to improving.  - metFORMIN (GLUCOPHAGE XR) 500 MG 24 hr tablet; Take 1 tablet (500 mg total) by mouth daily with breakfast.  2. 3-vessel coronary artery disease Asymptomatic. Compliant with medication.  Continue aggressive risk factor modification.  Continue routine follow up cardiology.   3. Need for vaccination against Streptococcus pneumoniae  - Pneumococcal polysaccharide vaccine 23-valent greater than or equal to 2yo subcutaneous/IM   No follow-ups on file.         , MD  [facility] [phone] (phone) [phone] (fax)  [facility]

## 2019-12-09 NOTE — Patient Instructions (Addendum)
.   Please review the attached list of medications and notify my office if there are any errors.   . We need to see your A1c down below 8.0. We can add injectable Victoza if i'ts still over 8 the next time we see you.

## 2019-12-20 MED FILL — METOPROLOL SUCCINATE ER 50: 50 | 30 days supply | Qty: 30 | Fill #6

## 2019-12-20 MED FILL — FARXIGA 10 MG TABLET: 10 | 30 days supply | Qty: 30 | Fill #6

## 2019-12-29 ENCOUNTER — Other Ambulatory Visit (HOSPITAL_COMMUNITY): Payer: Self-pay | Admitting: Internal Medicine

## 2019-12-29 ENCOUNTER — Other Ambulatory Visit (HOSPITAL_COMMUNITY): Payer: Self-pay | Admitting: Cardiology

## 2019-12-29 DIAGNOSIS — I5022 Chronic systolic (congestive) heart failure: Secondary | ICD-10-CM

## 2019-12-29 DIAGNOSIS — E782 Mixed hyperlipidemia: Secondary | ICD-10-CM

## 2019-12-29 DIAGNOSIS — I5020 Unspecified systolic (congestive) heart failure: Secondary | ICD-10-CM

## 2019-12-29 DIAGNOSIS — I2510 Atherosclerotic heart disease of native coronary artery without angina pectoris: Secondary | ICD-10-CM

## 2019-12-29 MED ORDER — FUROSEMIDE 20 MG PO TABS: 20.0000 mg | ORAL_TABLET | Freq: Every day | ORAL | 6 refills | Status: DC

## 2019-12-29 MED FILL — ATORVASTATIN 40 MG TABLET: 40 | 30 days supply | Qty: 30 | Fill #0

## 2019-12-29 MED FILL — CLOPIDOGREL 75 MG TABLET: 75 | 30 days supply | Qty: 30 | Fill #0

## 2019-12-29 MED FILL — FUROSEMIDE 20 MG TABS: 20 | 30 days supply | Qty: 30 | Fill #0

## 2019-12-29 MED FILL — DIGOXIN 0.125 MG TABLET: 125 | 30 days supply | Qty: 30 | Fill #0

## 2019-12-29 MED FILL — SPIRONOLACTONE 25 MG TABS: 25 | 30 days supply | Qty: 30 | Fill #6

## 2020-01-03 MED FILL — ATORVASTATIN 40 MG TABLET: 40 | 30 days supply | Qty: 30 | Fill #0

## 2020-01-03 MED FILL — SPIRONOLACTONE 25 MG TABS: 25 | 30 days supply | Qty: 30 | Fill #6

## 2020-01-03 MED FILL — CLOPIDOGREL 75 MG TABLET: 75 | 30 days supply | Qty: 30 | Fill #0

## 2020-01-03 MED FILL — FUROSEMIDE 20 MG TABS: 20 | 30 days supply | Qty: 30 | Fill #0

## 2020-01-03 MED FILL — DIGOXIN 0.125 MG TABLET: 125 | 30 days supply | Qty: 30 | Fill #0

## 2020-01-16 ENCOUNTER — Other Ambulatory Visit: Payer: Self-pay | Admitting: Family Medicine

## 2020-01-16 DIAGNOSIS — E1165 Type 2 diabetes mellitus with hyperglycemia: Secondary | ICD-10-CM

## 2020-01-19 MED FILL — FARXIGA 10 MG TABLET: 10 | 30 days supply | Qty: 30 | Fill #7

## 2020-01-19 MED FILL — METOPROLOL SUCCINATE ER 50: 50 | 30 days supply | Qty: 30 | Fill #7

## 2020-02-01 MED FILL — ATORVASTATIN 40 MG TABLET: 40 | 30 days supply | Qty: 30 | Fill #1

## 2020-02-01 MED FILL — SPIRONOLACTONE 25 MG TABS: 25 | 30 days supply | Qty: 30 | Fill #7

## 2020-02-01 MED FILL — FUROSEMIDE 20 MG TABS: 20 | 30 days supply | Qty: 30 | Fill #1

## 2020-02-01 MED FILL — CLOPIDOGREL 75 MG TABLET: 75 | 30 days supply | Qty: 30 | Fill #1

## 2020-02-01 MED FILL — DIGOXIN 0.125 MG TABLET: 125 | 30 days supply | Qty: 30 | Fill #1

## 2020-02-13 ENCOUNTER — Telehealth: Payer: Self-pay

## 2020-02-13 NOTE — Telephone Encounter (Signed)
Copied from CRM [id number]. Topic: General - Other >> Feb 10, 2020 12:29 PM Tamela Oddi wrote: Reason for CRM: Patient called to schedule an appt. For testing at the practice where she said her daughter was tested and it came back positive.  Patient does not want to go inside a building to be tested and was told that the office comes out in the parking lot to test patients.  Please advise and call patient to schedule.  CB# [phone]

## 2020-02-14 NOTE — Telephone Encounter (Signed)
Called patient and he reports that he has already been tested. No need for an appt.

## 2020-02-17 MED FILL — METOPROLOL SUCCINATE ER 50: 50 | 30 days supply | Qty: 30 | Fill #8

## 2020-02-17 MED FILL — FARXIGA 10 MG TABLET: 10 | 30 days supply | Qty: 30 | Fill #8

## 2020-03-02 MED FILL — CLOPIDOGREL 75 MG TABLET: 75 | 30 days supply | Qty: 30 | Fill #2

## 2020-03-02 MED FILL — SPIRONOLACTONE 25 MG TABS: 25 | 30 days supply | Qty: 30 | Fill #8

## 2020-03-02 MED FILL — FUROSEMIDE 20 MG TABS: 20 | 30 days supply | Qty: 30 | Fill #2

## 2020-03-02 MED FILL — ATORVASTATIN 40 MG TABLET: 40 | 30 days supply | Qty: 30 | Fill #2

## 2020-03-02 MED FILL — DIGOXIN 0.125 MG TABLET: 125 | 30 days supply | Qty: 30 | Fill #2

## 2020-03-16 ENCOUNTER — Ambulatory Visit: Payer: Self-pay | Admitting: Family Medicine

## 2020-03-16 NOTE — Progress Notes (Deleted)
Established patient visit   Patient: Curtis [name]   DOB: 1955-09-22   65 y.o. Male  MRN: [id number] Visit Date: 03/16/2020  Today's healthcare provider: Mila Merry, MD   No chief complaint on file.  Subjective    HPI  Diabetes Mellitus Type II, Follow-up  Lab Results  Component Value Date   HGBA1C 8.8 (A) 12/09/2019   HGBA1C 9.1 (A) 10/25/2019   HGBA1C 7.0 (A) 06/28/2019   Wt Readings from Last 3 Encounters:  10/25/19 161 lb (73 kg)  06/23/19 156 lb (70.8 kg)  06/02/19 151 lb (68.5 kg)   Last seen for diabetes 3 months ago.  Management since then includes continuing same medications.  Recheck in 3 months. Discussed adding Victoza if not continuing to improving.  He reports {excellent/good/fair/poor:19665} compliance with treatment. He {is/is not:21021397} having side effects. {document side effects if present:1} Symptoms: {Yes/No:20286} fatigue {Yes/No:20286} foot ulcerations  {Yes/No:20286} appetite changes {Yes/No:20286} nausea  {Yes/No:20286} paresthesia of the feet  {Yes/No:20286} polydipsia  {Yes/No:20286} polyuria {Yes/No:20286} visual disturbances   {Yes/No:20286} vomiting     Home blood sugar records: {diabetes glucometry results:16657}  Episodes of hypoglycemia? {Yes/No:20286} {enter symptoms and frequency of symptoms if yes:1}   Current insulin regiment: none Most Recent Eye Exam: 06/21/2019 {Current exercise:16438:::1} {Current diet habits:16563:::1}  Pertinent Labs: Lab Results  Component Value Date   CHOL 149 03/26/2019   HDL 21 (L) 03/26/2019   LDLCALC 69 03/26/2019   TRIG 296 (H) 03/26/2019   CHOLHDL 7.1 03/26/2019   Lab Results  Component Value Date   NA 137 07/18/2019   K 4.9 07/18/2019   CREATININE 1.03 07/18/2019   GFRNONAA >60 07/18/2019   GFRAA >60 07/18/2019   GLUCOSE 216 (H) 07/18/2019     ---------------------------------------------------------------------------------------------------  {Show patient history  (optional):23778::" "}   Medications: Outpatient Medications Prior to Visit  Medication Sig  . acetaminophen (TYLENOL) 500 MG tablet Take 2 tablets (1,000 mg total) by mouth every 6 (six) hours.  Marland Kitchen aspirin 81 MG chewable tablet Chew 1 tablet (81 mg total) by mouth daily.  Marland Kitchen atorvastatin (LIPITOR) 40 MG tablet TAKE 1 TABLET (40 MG TOTAL) BY MOUTH DAILY AT 6 PM.  . clopidogrel (PLAVIX) 75 MG tablet TAKE 1 TABLET (75 MG TOTAL) BY MOUTH DAILY.  . dapagliflozin propanediol (FARXIGA) 10 MG TABS tablet Take 1 tablet (10 mg total) by mouth daily before breakfast.  . digoxin (LANOXIN) 0.125 MG tablet TAKE 1 TABLET (0.125 MG TOTAL) BY MOUTH DAILY.  . furosemide (LASIX) 20 MG tablet Take 1 tablet (20 mg total) by mouth daily.  Marland Kitchen glipiZIDE (GLUCOTROL) 10 MG tablet Take 1 tablet (10 mg total) by mouth daily before breakfast.  . HYDROcodone-acetaminophen (NORCO/VICODIN) 5-325 MG tablet Take 1 tablet by mouth every 6 (six) hours as needed for moderate pain.  Demetra Shiner Devices MISC Use to check blood sugar twice a day  . metFORMIN (GLUCOPHAGE-XR) 500 MG 24 hr tablet TAKE 1 TABLET BY MOUTH EVERY EVENING FOR 2 WEEKS, THEN INCREASE TO 2 TABLETS EVERY EVENING  . metoprolol succinate (TOPROL-XL) 50 MG 24 hr tablet Take 1 tablet (50 mg total) by mouth daily.  . sacubitril-valsartan (ENTRESTO) 97-103 MG Take 1 tablet by mouth 2 (two) times daily.  Marland Kitchen spironolactone (ALDACTONE) 25 MG tablet Take 1 tablet (25 mg total) by mouth daily.   No facility-administered medications prior to visit.    Review of Systems  {Labs  Heme  Chem  Endocrine  Serology  Results Review (optional):23779::" "}  Objective    There were no vitals taken for this visit. {Show previous vital signs (optional):23777::" "}   Physical Exam  ***  No results found for any visits on 03/16/20.  Assessment & Plan     ***  No follow-ups on file.      {provider attestation***:1}   Mila Merry, MD  [facility] [phone] (phone) [phone] (fax)  [facility]
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
## 2020-03-19 ENCOUNTER — Ambulatory Visit (HOSPITAL_COMMUNITY)
Admission: RE | Admit: 2020-03-19 | Discharge: 2020-03-19 | Disposition: A | Discharge status: Home / Self Care | Payer: Self-pay | Source: Ambulatory Visit | Attending: Internal Medicine | Admitting: Internal Medicine

## 2020-03-19 ENCOUNTER — Other Ambulatory Visit: Payer: Self-pay

## 2020-03-19 ENCOUNTER — Other Ambulatory Visit (HOSPITAL_COMMUNITY): Payer: Self-pay | Admitting: Internal Medicine

## 2020-03-19 ENCOUNTER — Encounter (HOSPITAL_COMMUNITY): Payer: Self-pay | Admitting: Internal Medicine

## 2020-03-19 VITALS — BP 122/70 | HR 67 | Wt 169.6 lb

## 2020-03-19 DIAGNOSIS — I5020 Unspecified systolic (congestive) heart failure: Secondary | ICD-10-CM

## 2020-03-19 DIAGNOSIS — R531 Weakness: Secondary | ICD-10-CM | POA: Insufficient documentation

## 2020-03-19 DIAGNOSIS — I5023 Acute on chronic systolic (congestive) heart failure: Secondary | ICD-10-CM | POA: Insufficient documentation

## 2020-03-19 DIAGNOSIS — I2510 Atherosclerotic heart disease of native coronary artery without angina pectoris: Secondary | ICD-10-CM | POA: Insufficient documentation

## 2020-03-19 DIAGNOSIS — E782 Mixed hyperlipidemia: Secondary | ICD-10-CM

## 2020-03-19 DIAGNOSIS — Z993 Dependence on wheelchair: Secondary | ICD-10-CM | POA: Insufficient documentation

## 2020-03-19 DIAGNOSIS — Z87891 Personal history of nicotine dependence: Secondary | ICD-10-CM | POA: Insufficient documentation

## 2020-03-19 DIAGNOSIS — Z79899 Other long term (current) drug therapy: Secondary | ICD-10-CM | POA: Insufficient documentation

## 2020-03-19 DIAGNOSIS — Z7984 Long term (current) use of oral hypoglycemic drugs: Secondary | ICD-10-CM | POA: Insufficient documentation

## 2020-03-19 DIAGNOSIS — E785 Hyperlipidemia, unspecified: Secondary | ICD-10-CM | POA: Insufficient documentation

## 2020-03-19 DIAGNOSIS — Z7982 Long term (current) use of aspirin: Secondary | ICD-10-CM | POA: Insufficient documentation

## 2020-03-19 DIAGNOSIS — E119 Type 2 diabetes mellitus without complications: Secondary | ICD-10-CM | POA: Insufficient documentation

## 2020-03-19 DIAGNOSIS — R200 Anesthesia of skin: Secondary | ICD-10-CM | POA: Insufficient documentation

## 2020-03-19 DIAGNOSIS — Z7902 Long term (current) use of antithrombotics/antiplatelets: Secondary | ICD-10-CM | POA: Insufficient documentation

## 2020-03-19 DIAGNOSIS — Z951 Presence of aortocoronary bypass graft: Secondary | ICD-10-CM | POA: Insufficient documentation

## 2020-03-19 DIAGNOSIS — I110 Hypertensive heart disease with heart failure: Secondary | ICD-10-CM | POA: Insufficient documentation

## 2020-03-19 DIAGNOSIS — M7989 Other specified soft tissue disorders: Secondary | ICD-10-CM | POA: Insufficient documentation

## 2020-03-19 DIAGNOSIS — I5022 Chronic systolic (congestive) heart failure: Secondary | ICD-10-CM

## 2020-03-19 HISTORY — DX: Heart failure, unspecified: I50.9

## 2020-03-19 LAB — CBC
HCT: 41.2 % (ref 39.0–52.0)
Hemoglobin: 13.5 g/dL (ref 13.0–17.0)
MCH: 29.3 pg (ref 26.0–34.0)
MCHC: 32.8 g/dL (ref 30.0–36.0)
MCV: 89.6 fL (ref 80.0–100.0)
Platelets: 184 10*3/uL (ref 150–400)
RBC: 4.6 MIL/uL (ref 4.22–5.81)
RDW: 14.6 % (ref 11.5–15.5)
WBC: 7.5 10*3/uL (ref 4.0–10.5)
nRBC: 0 % (ref 0.0–0.2)

## 2020-03-19 MED ORDER — SACUBITRIL-VALSARTAN 97-103 MG PO TABS
1.0000 | ORAL_TABLET | Freq: Two times a day (BID) | ORAL | 3 refills | Status: DC
Start: 2020-03-19 — End: 2020-03-19

## 2020-03-19 NOTE — Patient Instructions (Signed)
No changes to medications today  A refill for entresto has been sent to your pharmacy.  A prescription for compression stockings have been given to you today.  You can take it to [facility] on [address].  Labs today We will only contact you if something comes back abnormal or we need to make some changes. Otherwise no news is good news!  Your physician has requested that you have an echocardiogram. Echocardiography is a painless test that uses sound waves to create images of your heart. It provides your doctor with information about the size and shape of your heart and how well your heart's chambers and valves are working. This procedure takes approximately one hour. There are no restrictions for this procedure.  Your physician recommends that you schedule a follow-up appointment in: 6 months with an echo.  You will call a gall to schedule this appointment.   Please call office at [phone] option 2 if you have any questions or concerns.   At the Advanced Heart Failure Clinic, you and your health needs are our priority. As part of our continuing mission to provide you with exceptional heart care, we have created designated Provider Care Teams. These Care Teams include your primary Cardiologist (physician) and Advanced Practice Providers (APPs- Physician Assistants and Nurse Practitioners) who all work together to provide you with the care you need, when you need it.   You may see any of the following providers on your designated Care Team at your next follow up: Marland Kitchen Dr Arvilla Meres . Dr Marca Ancona . Dr Thornell Mule . Tonye Becket, NP . Robbie Lis, PA . Shanda Bumps [place],NP . Karle Plumber, PharmD   Please be sure to bring in all your medications bottles to every appointment.

## 2020-03-20 MED FILL — METOPROLOL SUCCINATE ER 50: 50 | 30 days supply | Qty: 30 | Fill #9

## 2020-03-20 MED FILL — FARXIGA 10 MG TABLET: 10 | 30 days supply | Qty: 30 | Fill #9

## 2020-03-27 ENCOUNTER — Other Ambulatory Visit: Payer: Self-pay | Admitting: Family Medicine

## 2020-03-27 DIAGNOSIS — G8929 Other chronic pain: Secondary | ICD-10-CM

## 2020-03-27 NOTE — Telephone Encounter (Signed)
Medication Refill - Medication:   HYDROcodone-acetaminophen (NORCO/VICODIN) 5-325 MG tablet   Has the patient contacted their pharmacy? Yes, call doctors office, no refills left.   Preferred Pharmacy (with phone number or street name):  [id number] [place], [place] - [address] AT [facility] [address] [facility]  [address] [place], [place] [address]  Phone:  [phone] Fax:  [phone]   Agent: Please be advised that RX refills may take up to 3 business days. We ask that you follow-up with your pharmacy.

## 2020-03-27 NOTE — Telephone Encounter (Signed)
Requested medication (s) are due for refill today: Yes  Requested medication (s) are on the active medication list: Yes  Last refill:  10/17/19  Future visit scheduled: No  Notes to clinic:  Unable to refill per protocol, cannot delegate.      Requested Prescriptions  Pending Prescriptions Disp Refills   HYDROcodone-acetaminophen (NORCO/VICODIN) 5-325 MG tablet 30 tablet 0    Sig: Take 1 tablet by mouth every 6 (six) hours as needed for moderate pain.      Not Delegated - Analgesics:  Opioid Agonist Combinations Failed - 03/27/2020  4:04 PM      Failed - This refill cannot be delegated      Failed - Urine Drug Screen completed in last 360 days      Passed - Valid encounter within last 6 months    Recent Outpatient Visits           3 months ago Uncontrolled type 2 diabetes mellitus with hyperglycemia [facility])   [facility] Malva Limes, MD   5 months ago Uncontrolled type 2 diabetes mellitus with hyperglycemia [facility])   [facility] Malva Limes, MD   9 months ago Controlled type 2 diabetes mellitus without complication, without long-term current use of insulin [facility])   [facility] [place], Adriana M, PA-C   10 months ago Controlled type 2 diabetes mellitus without complication, without long-term current use of insulin [facility])   [facility] Malva Limes, MD   1 year ago Controlled type 2 diabetes mellitus without complication, without long-term current use of insulin [facility])   [facility] Sherrie Mustache, Demetrios Isaacs, MD

## 2020-03-28 ENCOUNTER — Telehealth (HOSPITAL_COMMUNITY): Payer: Self-pay | Admitting: Pharmacy Technician

## 2020-03-28 ENCOUNTER — Other Ambulatory Visit: Payer: Self-pay | Admitting: Family Medicine

## 2020-03-28 MED ORDER — HYDROCODONE-ACETAMINOPHEN 5-325 MG PO TABS: 1.0000 | ORAL_TABLET | Freq: Four times a day (QID) | ORAL | 0 refills | Status: DC | PRN

## 2020-03-28 NOTE — Telephone Encounter (Signed)
Spoke to patient's wife regarding re-enrollment of Entresto assistance with [employer]. Emailed the application for the patient to sign.   Will follow up.

## 2020-03-30 MED FILL — CLOPIDOGREL 75 MG TABLET: 75 | 30 days supply | Qty: 30 | Fill #3

## 2020-03-30 MED FILL — ATORVASTATIN 40 MG TABLET: 40 | 30 days supply | Qty: 30 | Fill #3

## 2020-03-30 MED FILL — DIGOXIN 0.125 MG TABLET: 125 | 30 days supply | Qty: 30 | Fill #3

## 2020-03-30 MED FILL — FUROSEMIDE 20 MG TABS: 20 | 30 days supply | Qty: 30 | Fill #3

## 2020-03-30 MED FILL — SPIRONOLACTONE 25 MG TABS: 25 | 30 days supply | Qty: 30 | Fill #9

## 2020-04-06 NOTE — Telephone Encounter (Signed)
Sent in application via fax.  Will follow up.  

## 2020-04-16 ENCOUNTER — Telehealth (HOSPITAL_COMMUNITY): Payer: Self-pay | Admitting: Pharmacy Technician

## 2020-04-16 NOTE — Telephone Encounter (Signed)
Advanced Heart Failure Patient Advocate Encounter   Patient was approved to receive Entresto from [employer]  Patient ID: [id number] Effective dates: 04/09/20 through 04/27/21  Called and spoke with the patient's wife. Provided the phone number to [employer].  Archer Asa, CPhT

## 2020-04-18 ENCOUNTER — Other Ambulatory Visit: Payer: Self-pay | Admitting: Family Medicine

## 2020-04-18 DIAGNOSIS — E1165 Type 2 diabetes mellitus with hyperglycemia: Secondary | ICD-10-CM

## 2020-04-18 MED FILL — METOPROLOL SUCCINATE ER 50: 50 | 30 days supply | Qty: 30 | Fill #10

## 2020-04-18 MED FILL — FARXIGA 10 MG TABLET: 10 | 30 days supply | Qty: 30 | Fill #10

## 2020-04-18 NOTE — Telephone Encounter (Signed)
Requested medications are due for refill today.  unknown  Requested medications are on the active medications list.  Yes - Historical medication  Last refill. unknown  Future visit scheduled.   no  Notes to clinic.  Medication was d/c 12/09/2019

## 2020-04-20 NOTE — Telephone Encounter (Signed)
I called patient and an appt was made to f/u on diabetes and DM medication. Patient verbalized understanding.

## 2020-04-21 ENCOUNTER — Other Ambulatory Visit: Payer: Self-pay | Admitting: Family Medicine

## 2020-04-21 DIAGNOSIS — E1165 Type 2 diabetes mellitus with hyperglycemia: Secondary | ICD-10-CM

## 2020-04-21 NOTE — Telephone Encounter (Signed)
Requested Prescriptions  Pending Prescriptions Disp Refills  . glipiZIDE (GLUCOTROL) 10 MG tablet [Pharmacy Med Name: GLIPIZIDE 10MG  TABLETS] 90 tablet 0    Sig: TAKE 1 TABLET BY MOUTH DAILY BEFORE BREAKFAST     Endocrinology:  Diabetes - Sulfonylureas Failed - 04/21/2020  9:52 AM      Failed - HBA1C is between 0 and 7.9 and within 180 days    Hemoglobin A1C  Date Value Ref Range Status  12/09/2019 8.8 (A) 4.0 - 5.6 % Final  03/19/2018 >14.0  Final   Hgb A1c MFr Bld  Date Value Ref Range Status  03/25/2019 7.7 (H) 4.8 - 5.6 % Final    Comment:    (NOTE) Pre diabetes:          5.7%-6.4% Diabetes:              >6.4% Glycemic control for   <7.0% adults with diabetes          Passed - Valid encounter within last 6 months    Recent Outpatient Visits          4 months ago Uncontrolled type 2 diabetes mellitus with hyperglycemia [facility])   [facility], MD   5 months ago Uncontrolled type 2 diabetes mellitus with hyperglycemia [facility])   [facility], MD   9 months ago Controlled type 2 diabetes mellitus without complication, without long-term current use of insulin [facility])   [facility] [place], [place] M, M   11 months ago Controlled type 2 diabetes mellitus without complication, without long-term current use of insulin [facility])   [facility], MD   1 year ago Controlled type 2 diabetes mellitus without complication, without long-term current use of insulin [facility])   [facility], MD      Future Appointments            In 2 days Fisher, Malva Limes, MD [facility]

## 2020-04-23 ENCOUNTER — Other Ambulatory Visit: Payer: Self-pay

## 2020-04-23 ENCOUNTER — Encounter: Payer: Self-pay | Admitting: Family Medicine

## 2020-04-23 ENCOUNTER — Ambulatory Visit (INDEPENDENT_AMBULATORY_CARE_PROVIDER_SITE_OTHER): Payer: Self-pay | Admitting: Family Medicine

## 2020-04-23 VITALS — BP 138/72 | HR 65 | Temp 98.5°F | Resp 16

## 2020-04-23 DIAGNOSIS — E1165 Type 2 diabetes mellitus with hyperglycemia: Secondary | ICD-10-CM

## 2020-04-23 LAB — POCT GLYCOSYLATED HEMOGLOBIN (HGB A1C)
Est. average glucose Bld gHb Est-mCnc: 240
Hemoglobin A1C: 10 % — AB (ref 4.0–5.6)

## 2020-04-23 MED ORDER — RYBELSUS 3 MG PO TABS: ORAL_TABLET | ORAL | 0 refills | Status: DC

## 2020-04-23 NOTE — Patient Instructions (Addendum)
Start samples of Rybelsus by taking one tablet every morning for 4 weeks, then increase to 2 tablets daily.

## 2020-04-23 NOTE — Progress Notes (Signed)
Established patient visit   Patient: Curtis [name]   DOB: 07-25-1955   65 y.o. Male  MRN: [id number] Visit Date: 04/23/2020  Today's healthcare provider: Mila Merry, MD   Chief Complaint  Patient presents with  . Diabetes   Subjective    HPI  Diabetes Mellitus Type II, Follow-up  Lab Results  Component Value Date   HGBA1C 10.0 (A) 04/23/2020   HGBA1C 8.8 (A) 12/09/2019   HGBA1C 9.1 (A) 10/25/2019   Wt Readings from Last 3 Encounters:  03/19/20 169 lb 9.6 oz (76.9 kg)  10/25/19 161 lb (73 kg)  06/23/19 156 lb (70.8 kg)   Last seen for diabetes 4 months ago.  Management since then includes continuing same medications. Discussed adding Victoza if not continuing to improve.  He reports good compliance with treatment. He is not having side effects.  Symptoms: No fatigue No foot ulcerations  No appetite changes No nausea  No paresthesia of the feet  Yes polydipsia  Yes polyuria No visual disturbances   No vomiting     Home blood sugar records: fasting range: 200's  Episodes of hypoglycemia? No    Current insulin regiment: none Most Recent Eye Exam: 06/21/2019 Current exercise: none Current diet habits: well balanced  Pertinent Labs: Lab Results  Component Value Date   CHOL 149 03/26/2019   HDL 21 (L) 03/26/2019   LDLCALC 69 03/26/2019   TRIG 296 (H) 03/26/2019   CHOLHDL 7.1 03/26/2019   Lab Results  Component Value Date   NA 137 07/18/2019   K 4.9 07/18/2019   CREATININE 1.03 07/18/2019   GFRNONAA >60 07/18/2019   GFRAA >60 07/18/2019   GLUCOSE 216 (H) 07/18/2019     ---------------------------------------------------------------------------------------------------     Medications: Outpatient Medications Prior to Visit  Medication Sig  . acetaminophen (TYLENOL) 500 MG tablet Take 2 tablets (1,000 mg total) by mouth every 6 (six) hours.  Marland Kitchen aspirin 81 MG chewable tablet Chew 1 tablet (81 mg total) by mouth daily.  Marland Kitchen atorvastatin (LIPITOR)  40 MG tablet TAKE 1 TABLET (40 MG TOTAL) BY MOUTH DAILY AT 6 PM. (Patient taking differently: Take by mouth daily. at 6pm)  . clopidogrel (PLAVIX) 75 MG tablet TAKE 1 TABLET (75 MG TOTAL) BY MOUTH DAILY.  . dapagliflozin propanediol (FARXIGA) 10 MG TABS tablet TAKE 1 TABLET (10 MG TOTAL) BY MOUTH DAILY BEFORE BREAKFAST.  Marland Kitchen digoxin (LANOXIN) 0.125 MG tablet TAKE 1 TABLET (0.125 MG TOTAL) BY MOUTH DAILY.  . furosemide (LASIX) 20 MG tablet TAKE 1 TABLET (20 MG TOTAL) BY MOUTH DAILY.  Marland Kitchen glipiZIDE (GLUCOTROL) 10 MG tablet TAKE 1 TABLET BY MOUTH DAILY BEFORE BREAKFAST  . HYDROcodone-acetaminophen (NORCO/VICODIN) 5-325 MG tablet Take 1 tablet by mouth every 6 (six) hours as needed for moderate pain.  Demetra Shiner Devices MISC Use to check blood sugar twice a day  . metFORMIN (GLUCOPHAGE) 500 MG tablet Take 500 mg by mouth at bedtime.  . metoprolol succinate (TOPROL-XL) 50 MG 24 hr tablet TAKE 1 TABLET (50 MG TOTAL) BY MOUTH DAILY.  . sacubitril-valsartan (ENTRESTO) 97-103 MG TAKE 1 TABLET BY MOUTH 2 (TWO) TIMES DAILY.  Marland Kitchen spironolactone (ALDACTONE) 25 MG tablet TAKE 1 TABLET (25 MG TOTAL) BY MOUTH DAILY.   No facility-administered medications prior to visit.    Review of Systems  Constitutional: Negative for appetite change, chills and fever.  Respiratory: Negative for chest tightness, shortness of breath and wheezing.   Cardiovascular: Negative for chest pain and palpitations.  Gastrointestinal: Negative for abdominal pain, nausea and vomiting.  Endocrine: Positive for polydipsia, polyphagia and polyuria.       Objective    BP 138/72 (BP Location: Left Arm, Patient Position: Sitting, Cuff Size: Normal)   Pulse 65   Temp 98.5 F (36.9 C) (Oral)   Resp 16   SpO2 100% Comment: room air    Physical Exam   General: Appearance:     Well developed, well nourished male in no acute distress  Eyes:    PERRL, conjunctiva/corneas clear, EOM's intact       Lungs:     Clear to auscultation  bilaterally, respirations unlabored  Heart:    Normal heart rate. Normal rhythm. No murmurs, rubs, or gallops.   MS:   All extremities are intact.   Neurologic:   Awake, alert, oriented x 3. No apparent focal neurological           defect.         Results for orders placed or performed in visit on 04/23/20  POCT HgB A1C  Result Value Ref Range   Hemoglobin A1C 10.0 (A) 4.0 - 5.6 %   Est. average glucose Bld gHb Est-mCnc 240     Assessment & Plan     1. Uncontrolled type 2 diabetes mellitus with hyperglycemia (HCC)  Worse with cutting back metformin, but he does not tolerate more the 500mg  a day even with the extended release/  Start samples  Semaglutide (RYBELSUS) 3 MG TABS; Take one tablet daily for 4 weeks, then increase to 2 tablets daily  Dispense: 56 tablet; Refill: 0  Follow up 6 weeks.        The entirety of the information documented in the History of Present Illness, Review of Systems and Physical Exam were personally obtained by me. Portions of this information were initially documented by the CMA and reviewed by me for thoroughness and accuracy.      , MD  [facility] [phone] (phone) [phone] (fax)  [facility]

## 2020-05-02 ENCOUNTER — Other Ambulatory Visit (HOSPITAL_COMMUNITY): Payer: Self-pay

## 2020-05-02 MED FILL — Spironolactone Tab 25 MG: ORAL | 30 days supply | Qty: 30 | Fill #0 | Status: AC

## 2020-05-02 MED FILL — Digoxin Tab 125 MCG (0.125 MG): ORAL | 30 days supply | Qty: 30 | Fill #0 | Status: AC

## 2020-05-02 MED FILL — Atorvastatin Calcium Tab 40 MG (Base Equivalent): ORAL | 30 days supply | Qty: 30 | Fill #0 | Status: AC

## 2020-05-02 MED FILL — Furosemide Tab 20 MG: ORAL | 30 days supply | Qty: 30 | Fill #0 | Status: AC

## 2020-05-02 MED FILL — Clopidogrel Bisulfate Tab 75 MG (Base Equiv): ORAL | 30 days supply | Qty: 30 | Fill #0 | Status: AC

## 2020-05-21 ENCOUNTER — Other Ambulatory Visit (HOSPITAL_COMMUNITY): Payer: Self-pay

## 2020-05-21 MED FILL — Dapagliflozin Propanediol Tab 10 MG (Base Equivalent): ORAL | 30 days supply | Qty: 30 | Fill #0 | Status: AC

## 2020-05-21 MED FILL — Metoprolol Succinate Tab ER 24HR 50 MG (Tartrate Equiv): ORAL | 30 days supply | Qty: 30 | Fill #0 | Status: AC

## 2020-05-31 ENCOUNTER — Other Ambulatory Visit (HOSPITAL_COMMUNITY): Payer: Self-pay

## 2020-05-31 MED FILL — Atorvastatin Calcium Tab 40 MG (Base Equivalent): ORAL | 30 days supply | Qty: 30 | Fill #1 | Status: AC

## 2020-05-31 MED FILL — Digoxin Tab 125 MCG (0.125 MG): ORAL | 30 days supply | Qty: 30 | Fill #1 | Status: AC

## 2020-05-31 MED FILL — Clopidogrel Bisulfate Tab 75 MG (Base Equiv): ORAL | 30 days supply | Qty: 30 | Fill #1 | Status: AC

## 2020-05-31 MED FILL — Spironolactone Tab 25 MG: ORAL | 30 days supply | Qty: 30 | Fill #1 | Status: AC

## 2020-05-31 MED FILL — Furosemide Tab 20 MG: ORAL | 30 days supply | Qty: 30 | Fill #1 | Status: AC

## 2020-06-05 ENCOUNTER — Telehealth: Payer: Self-pay | Admitting: Family Medicine

## 2020-06-05 ENCOUNTER — Other Ambulatory Visit: Payer: Self-pay

## 2020-06-05 DIAGNOSIS — M79604 Pain in right leg: Secondary | ICD-10-CM

## 2020-06-05 DIAGNOSIS — G8929 Other chronic pain: Secondary | ICD-10-CM

## 2020-06-05 MED ORDER — HYDROCODONE-ACETAMINOPHEN 5-325 MG PO TABS: 1.0000 | ORAL_TABLET | Freq: Four times a day (QID) | ORAL | 0 refills | Status: DC | PRN

## 2020-06-05 NOTE — Telephone Encounter (Signed)
Pt called back to report that he runs out today of his sample supply, requesting more Semaglutide (RYBELSUS) 3 MG TABS (He received in office from PCP)   Also requesting HYDROcodone-acetaminophen (NORCO/VICODIN) 5-325 MG tablet [id number] - [place], [place] - [address] AT [facility] [address] [place]  [address] [place] [address]  Phone: [phone] Fax: [phone]

## 2020-06-05 NOTE — Telephone Encounter (Signed)
Patient needs refill of Hydrocodone-Acet 5-325 mg. [facility] in [place]

## 2020-06-05 NOTE — Telephone Encounter (Signed)
That's fine he can have another box. He should be taking 2 a day.

## 2020-06-05 NOTE — Telephone Encounter (Signed)
Patient came up to the office today and was given samples by Okey Regal.

## 2020-06-12 ENCOUNTER — Encounter: Payer: Self-pay | Admitting: Family Medicine

## 2020-06-12 ENCOUNTER — Other Ambulatory Visit: Payer: Self-pay

## 2020-06-12 ENCOUNTER — Ambulatory Visit (INDEPENDENT_AMBULATORY_CARE_PROVIDER_SITE_OTHER): Payer: Self-pay | Admitting: Family Medicine

## 2020-06-12 VITALS — BP 116/63 | HR 80 | Resp 16

## 2020-06-12 DIAGNOSIS — Z23 Encounter for immunization: Secondary | ICD-10-CM

## 2020-06-12 DIAGNOSIS — E1165 Type 2 diabetes mellitus with hyperglycemia: Secondary | ICD-10-CM

## 2020-06-12 LAB — POCT GLYCOSYLATED HEMOGLOBIN (HGB A1C): Hemoglobin A1C: 9.3 % — AB (ref 4.0–5.6)

## 2020-06-12 MED ORDER — RYBELSUS 3 MG PO TABS
2.0000 | ORAL_TABLET | Freq: Every day | ORAL | 0 refills | Status: DC
Start: 2020-06-12 — End: 2020-09-04

## 2020-06-12 NOTE — Patient Instructions (Addendum)
   Continue taking Rybelsus 2 tablets once a day. We are going to apply for patient assistance from the drug company to get 14mg  tablets. Contact my office if you are about to run out of the samples before medication arrives from the drug company   We'll have you cut back on the glipizide dose once we are able to get you on the 14mg  Rybelsus

## 2020-06-12 NOTE — Progress Notes (Signed)
Established patient visit   Patient: Curtis [name]   DOB: [birth year]   65 y.o. Male  MRN: [id number] Visit Date: 06/12/2020  Today's healthcare provider: Mila Merry, MD   Chief Complaint  Patient presents with  . Diabetes   Subjective    HPI  Diabetes Mellitus Type II, Follow-up  Lab Results  Component Value Date   HGBA1C 10.0 (A) 04/23/2020   HGBA1C 8.8 (A) 12/09/2019   HGBA1C 9.1 (A) 10/25/2019   Wt Readings from Last 3 Encounters:  03/19/20 169 lb 9.6 oz (76.9 kg)  10/25/19 161 lb (73 kg)  06/23/19 156 lb (70.8 kg)   Last seen for diabetes 1 months ago.  Management since then includes Start samples  Semaglutide (RYBELSUS) 3 MG TABS; Take one tablet daily for 4 weeks, then increase to 2 tablets daily   . He reports excellent compliance with treatment. He is not having side effects. Patient does report bad taste and reports that he had some G.I upset.  Symptoms: No fatigue No foot ulcerations  No appetite changes No nausea  No paresthesia of the feet  No polydipsia  No polyuria No visual disturbances   No vomiting     Home blood sugar records: 200-240  Episodes of hypoglycemia? No    Current insulin regiment: none Most Recent Eye Exam: 06/21/2019 Current exercise: none Current diet habits: not asked  Pertinent Labs: Lab Results  Component Value Date   CHOL 149 03/26/2019   HDL 21 (L) 03/26/2019   LDLCALC 69 03/26/2019   TRIG 296 (H) 03/26/2019   CHOLHDL 7.1 03/26/2019   Lab Results  Component Value Date   NA 137 07/18/2019   K 4.9 07/18/2019   CREATININE 1.03 07/18/2019   GFRNONAA >60 07/18/2019   GFRAA >60 07/18/2019   GLUCOSE 216 (H) 07/18/2019     ---------------------------------------------------------------------------------------------------    Allergies  Allergen Reactions  . Morphine And Related     UNSPECIFIED REACTION   . Codeine Nausea And Vomiting       Medications: Outpatient Medications Prior to Visit   Medication Sig  . acetaminophen (TYLENOL) 500 MG tablet Take 2 tablets (1,000 mg total) by mouth every 6 (six) hours.  Marland Kitchen aspirin 81 MG chewable tablet Chew 1 tablet (81 mg total) by mouth daily.  Marland Kitchen atorvastatin (LIPITOR) 40 MG tablet TAKE 1 TABLET (40 MG TOTAL) BY MOUTH DAILY AT 6 PM.  . clopidogrel (PLAVIX) 75 MG tablet TAKE 1 TABLET (75 MG TOTAL) BY MOUTH DAILY.  . dapagliflozin propanediol (FARXIGA) 10 MG TABS tablet TAKE 1 TABLET (10 MG TOTAL) BY MOUTH DAILY BEFORE BREAKFAST.  Marland Kitchen digoxin (LANOXIN) 0.125 MG tablet TAKE 1 TABLET (0.125 MG TOTAL) BY MOUTH DAILY.  . furosemide (LASIX) 20 MG tablet TAKE 1 TABLET (20 MG TOTAL) BY MOUTH DAILY.  Marland Kitchen glipiZIDE (GLUCOTROL) 10 MG tablet TAKE 1 TABLET BY MOUTH DAILY BEFORE BREAKFAST  . HYDROcodone-acetaminophen (NORCO/VICODIN) 5-325 MG tablet Take 1 tablet by mouth every 6 (six) hours as needed for moderate pain.  Demetra Shiner Devices MISC Use to check blood sugar twice a day  . metFORMIN (GLUCOPHAGE-XR) 500 MG 24 hr tablet Take 1 tablet (500 mg total) by mouth daily.  . metoprolol succinate (TOPROL-XL) 50 MG 24 hr tablet TAKE 1 TABLET (50 MG TOTAL) BY MOUTH DAILY.  . sacubitril-valsartan (ENTRESTO) 97-103 MG TAKE 1 TABLET BY MOUTH 2 (TWO) TIMES DAILY.  Marland Kitchen Semaglutide (RYBELSUS) 3 MG TABS Take one tablet daily for 4 weeks, then  increase to 2 tablets daily  . spironolactone (ALDACTONE) 25 MG tablet TAKE 1 TABLET (25 MG TOTAL) BY MOUTH DAILY.   No facility-administered medications prior to visit.    Review of Systems     Objective    BP 116/63   Pulse 80   Resp 16   SpO2 97%     Physical Exam  General appearance:  Overweight male, cooperative and in no acute distress Head: Normocephalic, without obvious abnormality, atraumatic Respiratory: Respirations even and unlabored, normal respiratory rate Extremities: All extremities are intact.  Skin: Skin color, texture, turgor normal. No rashes seen  Psych: Appropriate mood and affect. Neurologic:  Mental status: Alert, oriented to person, place, and time, thought content appropriate.   Results for orders placed or performed in visit on 06/12/20  POCT glycosylated hemoglobin (Hb A1C)  Result Value Ref Range   Hemoglobin A1C 9.3 (A) 4.0 - 5.6 %    Assessment & Plan     1. Uncontrolled type 2 diabetes mellitus with hyperglycemia (HCC) Doing well with initiation of rybelsus. No tolerating 2 x 3mg  a day. Given samples to last another month at current dose.  - Semaglutide (RYBELSUS) 3 MG TABS; Take 2 tablets by mouth daily.  Dispense: 56 tablet; Refill: 0  Will see if we can patient assistance at 14mg  once a day.  - AMB Referral to [facility] Coordinaton  2. Encounter for immunization  - PFIZER Comirnaty(GRAY TOP)COVID-19 Vaccine   Future Appointments  Date Time Provider Department Center  10/16/2020  1:40 PM [facility] 10/18/2020, MD [facility]        The entirety of the information documented in the History of Present Illness, Review of Systems and Physical Exam were personally obtained by me. Portions of this information were initially documented by the CMA and reviewed by me for thoroughness and accuracy.      Sherrie Mustache, MD  [facility] [phone] (phone) [phone] (fax)  [facility]

## 2020-06-20 ENCOUNTER — Other Ambulatory Visit (HOSPITAL_COMMUNITY): Payer: Self-pay | Admitting: Internal Medicine

## 2020-06-20 ENCOUNTER — Other Ambulatory Visit (HOSPITAL_COMMUNITY): Payer: Self-pay | Admitting: *Deleted

## 2020-06-20 ENCOUNTER — Other Ambulatory Visit (HOSPITAL_COMMUNITY): Payer: Self-pay

## 2020-06-20 DIAGNOSIS — I5020 Unspecified systolic (congestive) heart failure: Secondary | ICD-10-CM

## 2020-06-20 MED ORDER — METOPROLOL SUCCINATE ER 50 MG PO TB24
50.0000 mg | ORAL_TABLET | Freq: Every day | ORAL | 11 refills | Status: DC
  Filled 2020-06-20: qty 30, 30d supply, fill #0
  Filled 2020-07-19: qty 30, 30d supply, fill #1
  Filled 2020-08-20: qty 30, 30d supply, fill #2
  Filled 2020-09-18: qty 30, 30d supply, fill #3
  Filled 2020-10-18: qty 30, 30d supply, fill #4
  Filled 2020-11-16: qty 30, 30d supply, fill #5
  Filled 2020-12-17: qty 30, 30d supply, fill #6
  Filled 2021-01-18: qty 30, 30d supply, fill #7
  Filled 2021-02-19: qty 30, 30d supply, fill #8
  Filled 2021-03-22: qty 30, 30d supply, fill #9
  Filled 2021-04-22: qty 30, 30d supply, fill #10
  Filled 2021-05-21: qty 30, 30d supply, fill #11

## 2020-06-20 MED ORDER — DAPAGLIFLOZIN PROPANEDIOL 10 MG PO TABS
10.0000 mg | ORAL_TABLET | Freq: Every day | ORAL | 11 refills | Status: DC
  Filled 2020-06-20: qty 30, 30d supply, fill #0

## 2020-06-20 MED ORDER — DAPAGLIFLOZIN PROPANEDIOL 10 MG PO TABS
10.0000 mg | ORAL_TABLET | Freq: Every day | ORAL | 11 refills | Status: DC
  Filled 2020-06-20: qty 30, 30d supply, fill #0
  Filled 2020-07-19: qty 30, 30d supply, fill #1
  Filled 2020-08-20: qty 30, 30d supply, fill #2
  Filled 2020-09-18: qty 30, 30d supply, fill #3
  Filled 2020-10-18: qty 30, 30d supply, fill #4
  Filled 2020-11-16: qty 30, 30d supply, fill #5
  Filled 2020-12-17: qty 30, 30d supply, fill #6
  Filled 2021-01-18: qty 30, 30d supply, fill #7
  Filled 2021-02-19: qty 30, 30d supply, fill #8
  Filled 2021-03-22: qty 30, 30d supply, fill #9
  Filled 2021-04-22: qty 30, 30d supply, fill #10
  Filled 2021-05-21: qty 30, 30d supply, fill #11

## 2020-06-20 MED ORDER — METOPROLOL SUCCINATE ER 50 MG PO TB24
50.0000 mg | ORAL_TABLET | Freq: Every day | ORAL | 11 refills | Status: DC
  Filled 2020-06-20: qty 30, fill #0

## 2020-06-21 ENCOUNTER — Other Ambulatory Visit (HOSPITAL_COMMUNITY): Payer: Self-pay

## 2020-06-28 ENCOUNTER — Other Ambulatory Visit (HOSPITAL_COMMUNITY): Payer: Self-pay

## 2020-06-28 ENCOUNTER — Other Ambulatory Visit (HOSPITAL_COMMUNITY): Payer: Self-pay | Admitting: Internal Medicine

## 2020-06-28 DIAGNOSIS — E782 Mixed hyperlipidemia: Secondary | ICD-10-CM

## 2020-06-28 DIAGNOSIS — I2510 Atherosclerotic heart disease of native coronary artery without angina pectoris: Secondary | ICD-10-CM

## 2020-06-28 DIAGNOSIS — I5022 Chronic systolic (congestive) heart failure: Secondary | ICD-10-CM

## 2020-06-28 MED ORDER — DIGOXIN 125 MCG PO TABS
0.1250 mg | ORAL_TABLET | Freq: Every day | ORAL | 3 refills | Status: DC
  Filled 2020-06-28: qty 30, 30d supply, fill #0

## 2020-06-28 MED ORDER — CLOPIDOGREL BISULFATE 75 MG PO TABS
75.0000 mg | ORAL_TABLET | Freq: Every day | ORAL | 3 refills | Status: DC
  Filled 2020-06-28: qty 30, 30d supply, fill #0

## 2020-06-28 MED ORDER — ATORVASTATIN CALCIUM 40 MG PO TABS
40.0000 mg | ORAL_TABLET | Freq: Every day | ORAL | 3 refills | Status: DC
  Filled 2020-06-28: qty 30, 30d supply, fill #0

## 2020-06-28 MED ORDER — SPIRONOLACTONE 25 MG PO TABS
25.0000 mg | ORAL_TABLET | Freq: Every day | ORAL | 3 refills | Status: DC
  Filled 2020-06-28: qty 30, 30d supply, fill #0

## 2020-06-28 MED FILL — Furosemide Tab 20 MG: ORAL | 30 days supply | Qty: 30 | Fill #2 | Status: CN

## 2020-06-29 ENCOUNTER — Other Ambulatory Visit (HOSPITAL_COMMUNITY): Payer: Self-pay

## 2020-06-29 ENCOUNTER — Other Ambulatory Visit (HOSPITAL_COMMUNITY): Payer: Self-pay | Admitting: *Deleted

## 2020-06-29 DIAGNOSIS — E782 Mixed hyperlipidemia: Secondary | ICD-10-CM

## 2020-06-29 DIAGNOSIS — I2510 Atherosclerotic heart disease of native coronary artery without angina pectoris: Secondary | ICD-10-CM

## 2020-06-29 DIAGNOSIS — I5020 Unspecified systolic (congestive) heart failure: Secondary | ICD-10-CM

## 2020-06-29 DIAGNOSIS — I5022 Chronic systolic (congestive) heart failure: Secondary | ICD-10-CM

## 2020-06-29 MED ORDER — SPIRONOLACTONE 25 MG PO TABS
25.0000 mg | ORAL_TABLET | Freq: Every day | ORAL | 3 refills | Status: DC
  Filled 2020-06-29: qty 30, 30d supply, fill #0
  Filled 2020-07-31: qty 30, 30d supply, fill #1
  Filled 2020-08-30: qty 30, 30d supply, fill #2
  Filled 2020-10-01: qty 30, 30d supply, fill #3
  Filled 2020-10-30: qty 30, 30d supply, fill #4
  Filled 2020-11-29: qty 30, 30d supply, fill #5
  Filled 2020-12-28: qty 30, 30d supply, fill #6
  Filled 2021-01-30: qty 30, 30d supply, fill #7
  Filled 2021-03-04: qty 30, 30d supply, fill #8
  Filled 2021-04-03: qty 30, 30d supply, fill #9
  Filled 2021-05-03: qty 30, 30d supply, fill #10
  Filled 2021-06-03: qty 30, 30d supply, fill #11

## 2020-06-29 MED ORDER — CLOPIDOGREL BISULFATE 75 MG PO TABS
75.0000 mg | ORAL_TABLET | Freq: Every day | ORAL | 3 refills | Status: DC
  Filled 2020-06-29: qty 30, 30d supply, fill #0
  Filled 2020-07-31: qty 30, 30d supply, fill #1
  Filled 2020-08-30: qty 30, 30d supply, fill #2
  Filled 2020-10-01: qty 30, 30d supply, fill #3
  Filled 2020-10-30: qty 30, 30d supply, fill #4
  Filled 2020-11-29: qty 30, 30d supply, fill #5

## 2020-06-29 MED ORDER — FUROSEMIDE 20 MG PO TABS
20.0000 mg | ORAL_TABLET | Freq: Every day | ORAL | 3 refills | Status: DC
  Filled 2020-06-29: qty 30, 30d supply, fill #0
  Filled 2020-07-31: qty 30, 30d supply, fill #1
  Filled 2020-08-30: qty 30, 30d supply, fill #2
  Filled 2020-10-01: qty 30, 30d supply, fill #3

## 2020-06-29 MED ORDER — ATORVASTATIN CALCIUM 40 MG PO TABS
40.0000 mg | ORAL_TABLET | Freq: Every day | ORAL | 3 refills | Status: DC
  Filled 2020-06-29: qty 30, 30d supply, fill #0
  Filled 2020-07-31: qty 30, 30d supply, fill #1
  Filled 2020-08-30: qty 30, 30d supply, fill #2
  Filled 2020-10-01: qty 30, 30d supply, fill #3
  Filled 2020-10-30: qty 30, 30d supply, fill #4
  Filled 2020-11-29: qty 30, 30d supply, fill #5
  Filled 2020-12-28: qty 30, 30d supply, fill #6
  Filled 2021-01-30: qty 30, 30d supply, fill #7
  Filled 2021-03-04: qty 30, 30d supply, fill #8
  Filled 2021-04-03: qty 30, 30d supply, fill #9
  Filled 2021-05-03: qty 30, 30d supply, fill #10
  Filled 2021-06-03: qty 30, 30d supply, fill #11

## 2020-06-29 MED ORDER — DIGOXIN 125 MCG PO TABS
0.1250 mg | ORAL_TABLET | Freq: Every day | ORAL | 3 refills | Status: DC
  Filled 2020-06-29: qty 30, 30d supply, fill #0
  Filled 2020-07-31: qty 30, 30d supply, fill #1
  Filled 2020-08-30: qty 30, 30d supply, fill #2
  Filled 2020-10-01: qty 30, 30d supply, fill #3
  Filled 2020-10-30: qty 30, 30d supply, fill #4
  Filled 2020-11-29: qty 30, 30d supply, fill #5
  Filled 2020-12-28: qty 30, 30d supply, fill #6
  Filled 2021-01-30: qty 30, 30d supply, fill #7
  Filled 2021-03-04: qty 30, 30d supply, fill #8
  Filled 2021-04-03: qty 30, 30d supply, fill #9
  Filled 2021-05-03: qty 30, 30d supply, fill #10
  Filled 2021-06-03: qty 30, 30d supply, fill #11

## 2020-07-02 ENCOUNTER — Other Ambulatory Visit (HOSPITAL_COMMUNITY): Payer: Self-pay

## 2020-07-19 ENCOUNTER — Other Ambulatory Visit (HOSPITAL_COMMUNITY): Payer: Self-pay

## 2020-07-26 ENCOUNTER — Telehealth: Payer: Self-pay | Admitting: *Deleted

## 2020-07-26 NOTE — Chronic Care Management (AMB) (Signed)
  Care Management   Note  07/26/2020 Name: Curtis [name] MRN: [id number] DOB: [birth year]  Curtis [name] is a 65 y.o. year old male who is a primary care patient of Sherrie Mustache, Demetrios Isaacs, MD. I reached out to Cherre Robins by phone today in response to a referral sent by Curtis [name]'s Dr. Sherrie Mustache.    Curtis [name] was given information about care management services today including:  Care management services include personalized support from designated clinical staff supervised by his physician, including individualized plan of care and coordination with other care providers 24/7 contact phone numbers for assistance for urgent and routine care needs. The patient may stop care management services at any time by phone call to the office staff.  Patient agreed to services and verbal consent obtained.   Follow up plan: Telephone appointment with care management team member scheduled for:08/20/2020  [facility] Guide, Embedded Care Coordination [facility] Management

## 2020-07-31 ENCOUNTER — Other Ambulatory Visit (HOSPITAL_COMMUNITY): Payer: Self-pay

## 2020-08-03 ENCOUNTER — Telehealth: Payer: Self-pay | Admitting: *Deleted

## 2020-08-03 NOTE — Telephone Encounter (Signed)
Is it okay to leave samples if available?   Thanks,   -Derin Matthes  

## 2020-08-03 NOTE — Telephone Encounter (Signed)
Update we don't have Rybelsus is there an alternative he can take?  Thanks,   -Vernona Rieger

## 2020-08-03 NOTE — Telephone Encounter (Signed)
We only have boxes of 3mg  tablets. He can have two boxes and needs to take 2 a day which should last 1 month.

## 2020-08-03 NOTE — Telephone Encounter (Signed)
Samples are at the front desk.  Mrs Mojica advised.   Thanks,   -Vernona Rieger

## 2020-08-03 NOTE — Telephone Encounter (Signed)
Copied from CRM [id number]. Topic: General - Other >> Aug 02, 2020  3:48 PM Marylen Ponto wrote: Reason for CRM: Pt spouse stated pt only has 4 pills of the Semaglutide (RYBELSUS) 3 MG TABS left and requests that Dr. Theodis Aguas nurse return their call. Pt spouse also stated pt normally gets samples and he has not received the alternate medication that was suppose to be sent to the home. Cb# [phone]

## 2020-08-09 IMAGING — CR DG CHEST 2V
2 series · 2 of 2 positions shown · non-contrast
Comparison: Chest x-ray 04/18/2019.

CLINICAL DATA: 63-year-old male with history of dyspnea. Chronic
systolic heart failure.

EXAM:
CHEST - 2 VIEW

[chest pa]
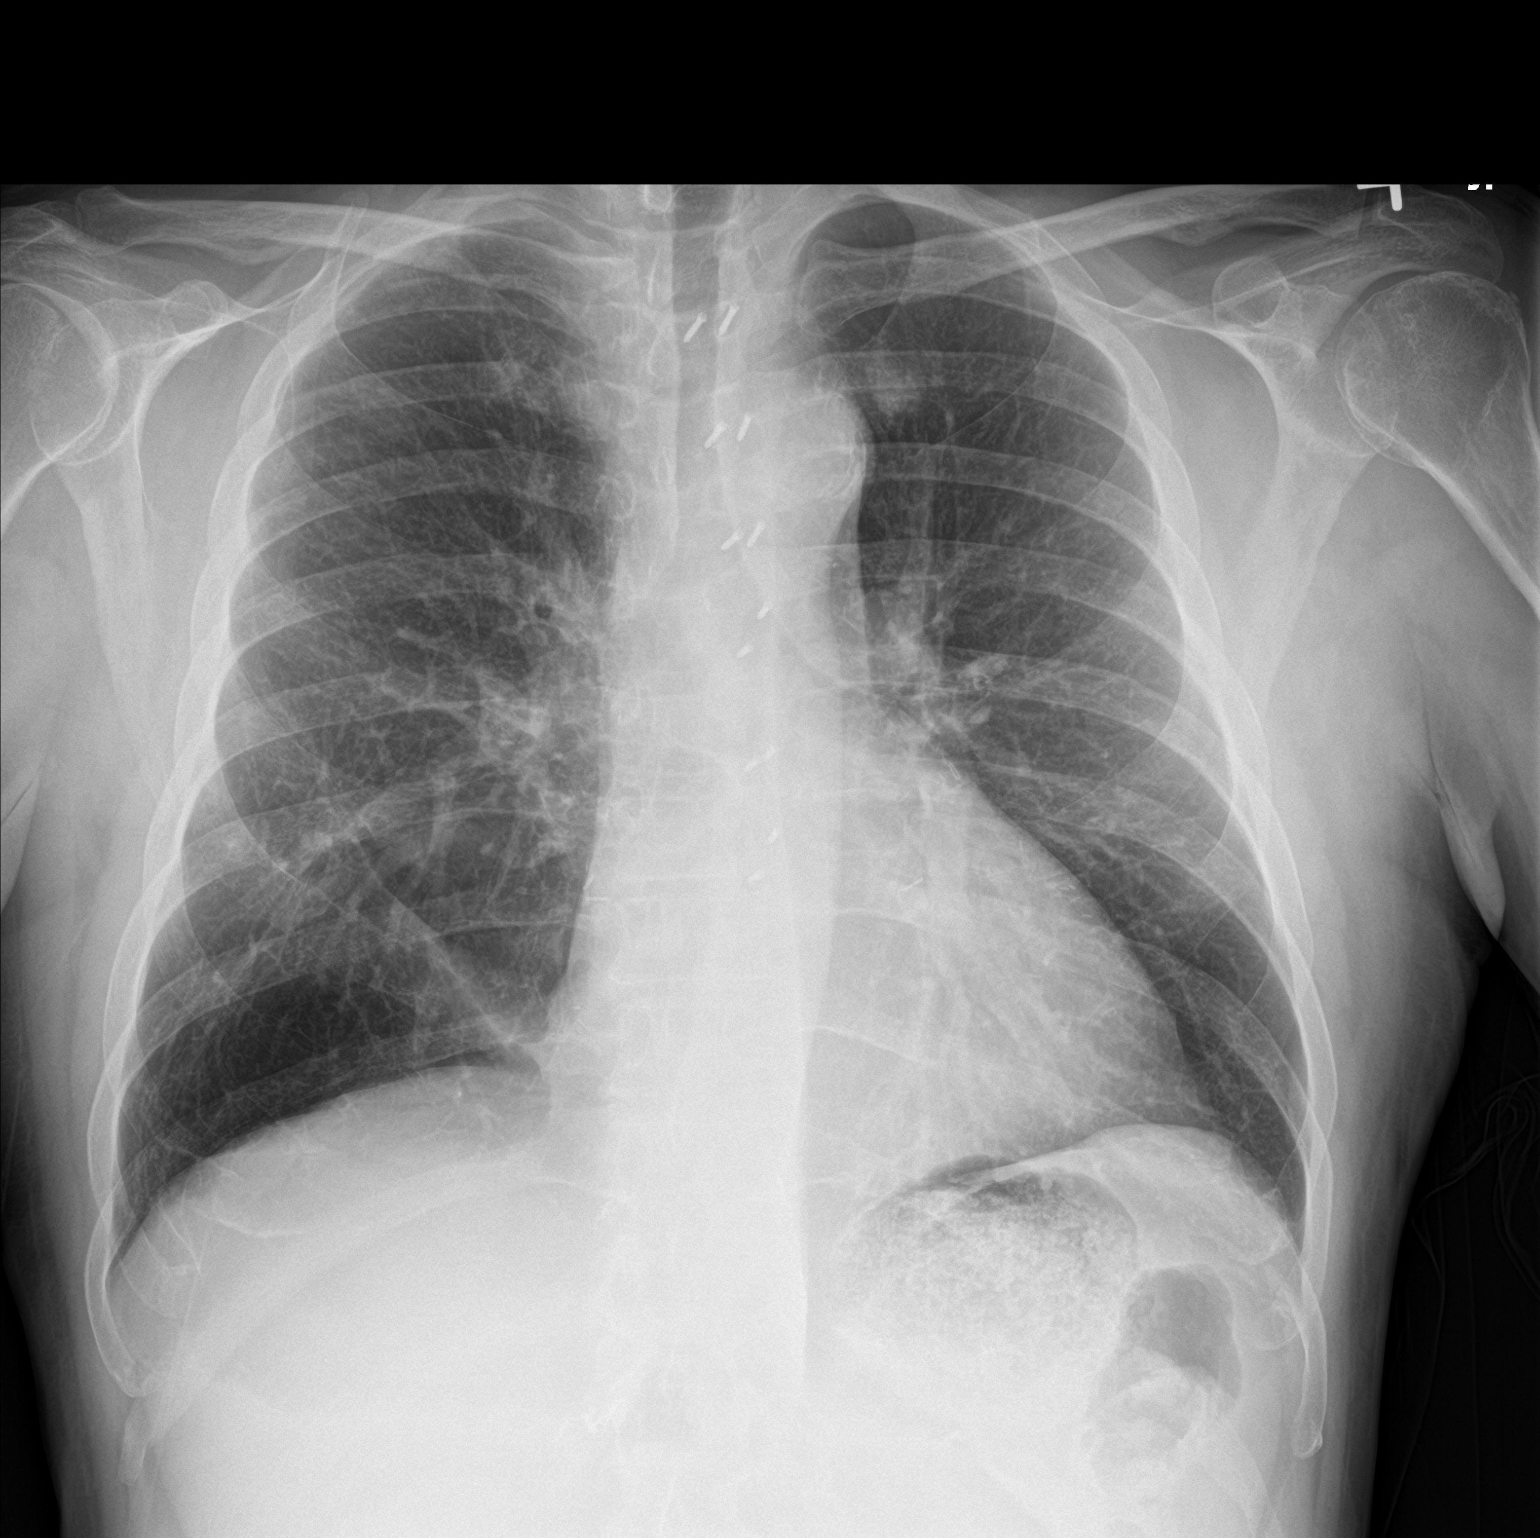

[chest lat]
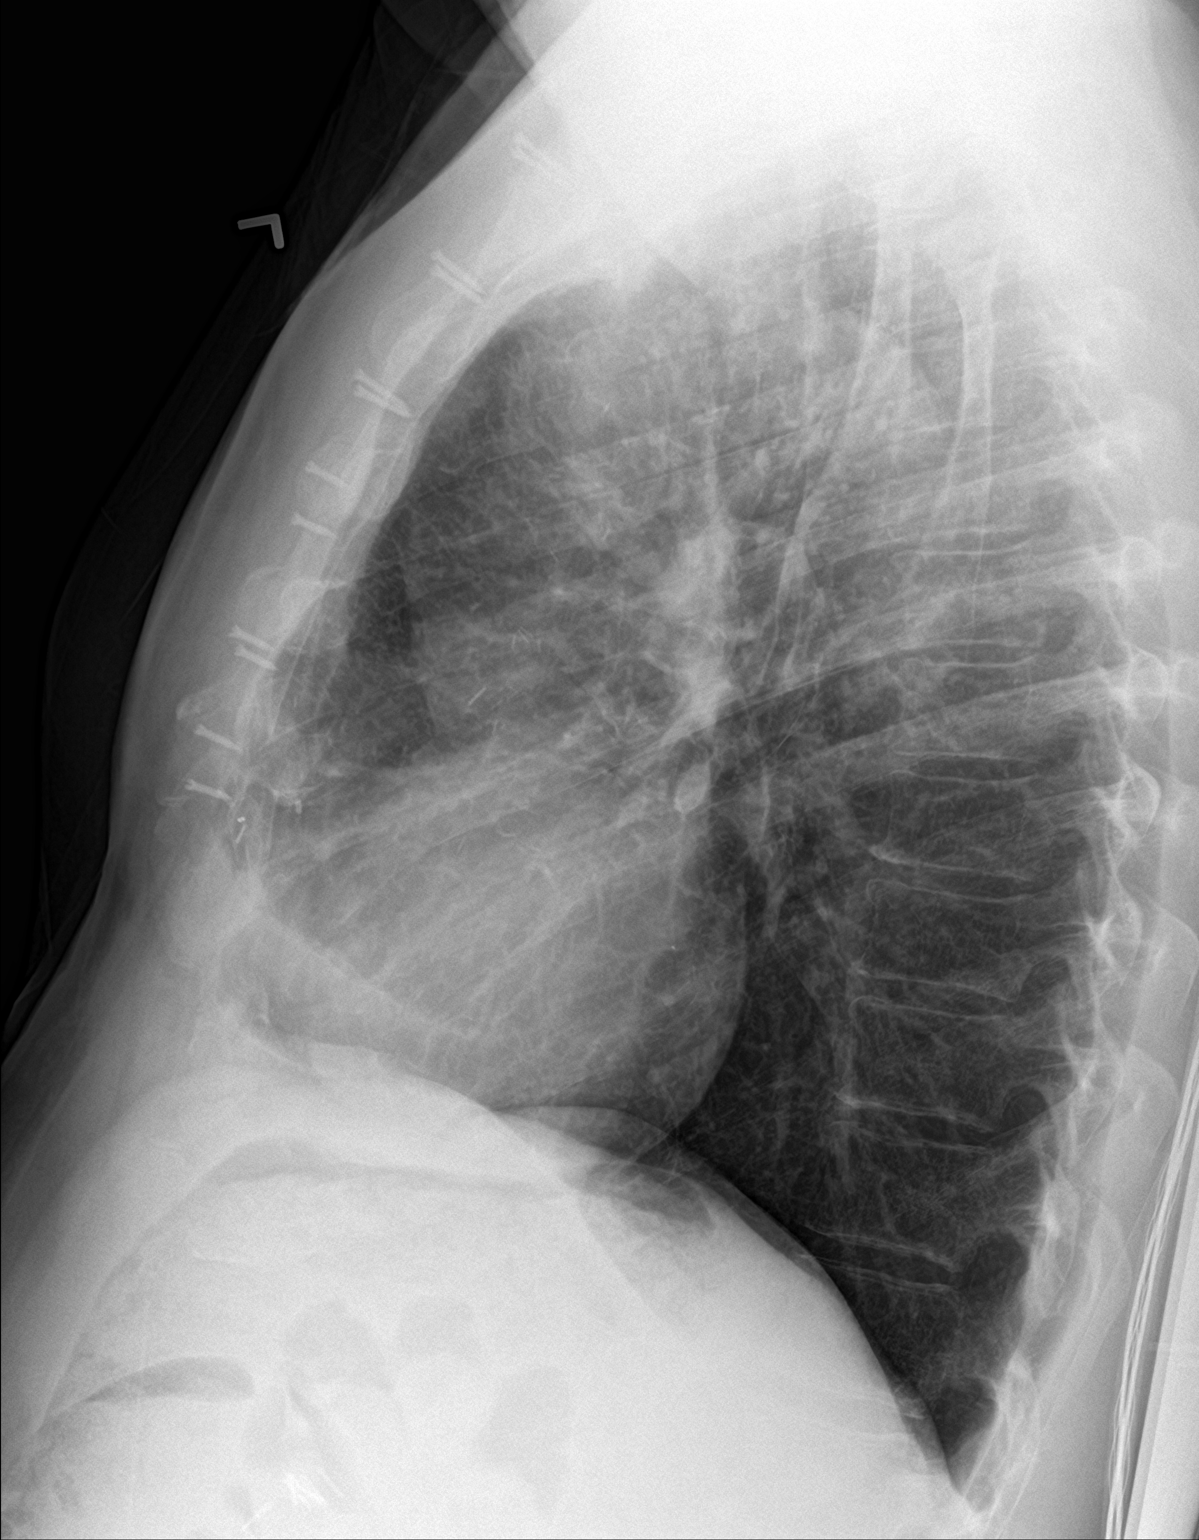

[2 of 2 positions shown; findings below may reference images not displayed]

FINDINGS: Areas of interstitial prominence and architectural distortion are
noted in the lungs bilaterally, most severe in the right mid lung,
suggestive of post infectious or inflammatory scarring. No acute
consolidative airspace disease. No pleural effusions. No
pneumothorax. No evidence of pulmonary edema. Heart size is normal.
Upper mediastinal contours are within normal limits. Aortic
atherosclerosis. Small fixation screws projecting over the sternum.
IMPRESSION: 1. No radiographic evidence of acute cardiopulmonary disease.
2. Probable areas of chronic post infectious or inflammatory
scarring, similar to prior studies, as above.
3. Aortic atherosclerosis.

## 2020-08-16 ENCOUNTER — Other Ambulatory Visit (HOSPITAL_COMMUNITY): Payer: Self-pay

## 2020-08-20 ENCOUNTER — Ambulatory Visit: Payer: Self-pay

## 2020-08-20 ENCOUNTER — Telehealth: Payer: Self-pay

## 2020-08-20 ENCOUNTER — Other Ambulatory Visit (HOSPITAL_COMMUNITY): Payer: Self-pay

## 2020-08-20 DIAGNOSIS — E1165 Type 2 diabetes mellitus with hyperglycemia: Secondary | ICD-10-CM

## 2020-08-20 NOTE — Progress Notes (Signed)
    Chronic Care Management Pharmacy Assistant   Name: Curtis [name]  MRN: [id number] DOB: 04/17/1955  Chart Review for clinical pharmacist for 08/20/2020.  Conditions to be addressed/monitored: HLD, DMII, and HFrEF, Anemia  Primary concerns for visit include: None ID   Recent office visits:  06/12/2020 Dr. Sherrie Mustache MD [facility] Referral to [facility] Coordinaton 04/23/2020 Dr. Sherrie Mustache MD (PCP) Start samples  Semaglutide (RYBELSUS) 3 MG TABS; Take one tablet daily for 4 weeks, then increase to 2 tablets daily  Recent consult visits:  03/19/2020 Dr.Bensimhon MD (cardiology)  No Medication Changes Noted.  Hospital visits:  None in previous 6 months  Medications: Outpatient Encounter Medications as of 08/20/2020  Medication Sig   acetaminophen (TYLENOL) 500 MG tablet Take 2 tablets (1,000 mg total) by mouth every 6 (six) hours.   aspirin 81 MG chewable tablet Chew 1 tablet (81 mg total) by mouth daily.   atorvastatin (LIPITOR) 40 MG tablet TAKE 1 TABLET (40 MG TOTAL) BY MOUTH DAILY AT 6 PM.   clopidogrel (PLAVIX) 75 MG tablet Take 1 tablet (75 mg total) by mouth daily.   dapagliflozin propanediol (FARXIGA) 10 MG TABS tablet Take 1 tablet (10 mg total) by mouth daily before breakfast.   digoxin (LANOXIN) 0.125 MG tablet Take 1 tablet (0.125 mg total) by mouth daily.   furosemide (LASIX) 20 MG tablet Take 1 tablet (20 mg total) by mouth daily.   glipiZIDE (GLUCOTROL) 10 MG tablet TAKE 1 TABLET BY MOUTH DAILY BEFORE BREAKFAST   HYDROcodone-acetaminophen (NORCO/VICODIN) 5-325 MG tablet Take 1 tablet by mouth every 6 (six) hours as needed for moderate pain.   Lancet Devices MISC Use to check blood sugar twice a day   metFORMIN (GLUCOPHAGE-XR) 500 MG 24 hr tablet Take 1 tablet (500 mg total) by mouth daily.   metoprolol succinate (TOPROL-XL) 50 MG 24 hr tablet Take 1 tablet (50 mg total) by mouth daily.   sacubitril-valsartan (ENTRESTO) 97-103 MG TAKE 1 TABLET BY MOUTH 2 (TWO) TIMES  DAILY.   Semaglutide (RYBELSUS) 3 MG TABS Take 2 tablets by mouth daily.   spironolactone (ALDACTONE) 25 MG tablet Take 1 tablet (25 mg total) by mouth daily.   No facility-administered encounter medications on file as of 08/20/2020.    Care Gaps: Foot Exam Tetanus/Tdap Shingrix Colonoscopy Ophthalmology Influenza Vaccine Star Rating Drugs: Atorvastatin 40 mg last filled 08/01/2020 30 day supply at [facility] cone Outpatient Farxiga 10 mg last filled 07/19/2020 30 day supply at [facility] Outpatient Rybelsus 3 mg samples given from PCP office on 08/03/2020. Metformin 500 mg last filled 04/25/2020 90 day supply at [facility] Glipizide 10 mg last filled 04/21/2020 90 day supply at [facility]. Entresto 97-103 mg last filled 04/07/2019 30 day supply at [facility] Transitions Medication Fill Gaps:  Everlean Cherry Clinical Pharmacist Assistant [phone]

## 2020-08-20 NOTE — Progress Notes (Signed)
Chronic Care Management Pharmacy Note  08/20/2020 Name:  SMILEY BIRR MRN:  [id number] DOB:  04/22/1955  Subjective: MARKELLE NAJARIAN is an 65 y.o. year old male who is a primary patient of Fisher, Kirstie Peri, MD.  The CCM team was consulted for assistance with disease management and care coordination needs.    Engaged with patient by telephone for initial visit in response to provider referral for pharmacy case management and/or care coordination services.   Consent to Services:  Patient does qualify for CCM services. CPP Consulted for assistance with medication costs.   Patient Care Team: Birdie Sons, MD as PCP - General (Family Medicine) Wellington Hampshire, MD as PCP - Cardiology (Cardiology) Dingeldein, Remo Lipps, MD (Ophthalmology) Isaias Sakai, MD as Referring Physician (Ophthalmology) Germaine Pomfret, [facility] (Pharmacist)  Objective:  Lab Results  Component Value Date   CREATININE 1.03 07/18/2019   BUN 26 (H) 07/18/2019   GFRNONAA >60 07/18/2019   GFRAA >60 07/18/2019   NA 137 07/18/2019   K 4.9 07/18/2019   CALCIUM 9.4 07/18/2019   CO2 24 07/18/2019   GLUCOSE 216 (H) 07/18/2019    Lab Results  Component Value Date/Time   HGBA1C 9.3 (A) 06/12/2020 03:21 PM   HGBA1C 10.0 (A) 04/23/2020 03:42 PM   HGBA1C 7.7 (H) 03/25/2019 11:44 PM   HGBA1C 7.0 (H) 12/27/2018 02:44 PM   HGBA1C >14.0 03/19/2018 11:33 AM   MICROALBUR 50 10/25/2019 11:26 AM   MICROALBUR 100 08/27/2018 02:38 PM    Last diabetic Eye exam:  Lab Results  Component Value Date/Time   HMDIABEYEEXA Retinopathy (A) 06/21/2019 12:00 AM    Last diabetic Foot exam: No results found for: HMDIABFOOTEX   Lab Results  Component Value Date   CHOL 149 03/26/2019   HDL 21 (L) 03/26/2019   LDLCALC 69 03/26/2019   TRIG 296 (H) 03/26/2019   CHOLHDL 7.1 03/26/2019    Hepatic Function Latest Ref Rng & Units 03/31/2019 03/25/2019 09/06/2018  Total Protein 6.5 - 8.1 g/dL 7.5 8.3(H) 7.1  Albumin 3.5 - 5.0 g/dL 3.0(L)  3.6 4.1  AST 15 - 41 U/L $Remo'20 18 12  'JXhCk$ ALT 0 - 44 U/L $Remo'17 13 11  'kodUe$ Alk Phosphatase 38 - 126 U/L 69 78 78  Total Bilirubin 0.3 - 1.2 mg/dL 0.8 0.9 0.4    Lab Results  Component Value Date/Time   TSH 5.230 (H) 09/06/2018 09:15 AM    CBC Latest Ref Rng & Units 03/19/2020 07/18/2019 04/06/2019  WBC 4.0 - 10.5 K/uL 7.5 8.7 8.4  Hemoglobin 13.0 - 17.0 g/dL 13.5 11.8(L) 9.0(L)  Hematocrit 39.0 - 52.0 % 41.2 37.4(L) 29.0(L)  Platelets 150 - 400 K/uL 184 234 305    No results found for: VD25OH  Clinical ASCVD: Yes  The ASCVD Risk score Mikey Bussing DC Jr., et al., 2013) failed to calculate for the following reasons:   The patient has a prior MI or stroke diagnosis    Depression screen [facility] 2/9 10/25/2019 05/24/2019 03/19/2018  Decreased Interest 0 0 0  Down, Depressed, Hopeless 0 0 0  PHQ - 2 Score 0 0 0  Altered sleeping 0 - -  Tired, decreased energy 0 - -  Change in appetite 0 - -  Feeling bad or failure about yourself  0 - -  Trouble concentrating 0 - -  Moving slowly or fidgety/restless 0 - -  Suicidal thoughts 0 - -  PHQ-9 Score 0 - -  Difficult doing work/chores Not difficult at all - -  Social History   Tobacco Use  Smoking Status Former   Packs/day: 0.75   Types: Cigarettes   Quit date: 04/26/2018   Years since quitting: 2.3  Smokeless Tobacco Never  Tobacco Comments   2-3 ppd since 65yo. cut back to under a ppd since around 2010   BP Readings from Last 3 Encounters:  06/12/20 116/63  04/23/20 138/72  03/19/20 122/70   Pulse Readings from Last 3 Encounters:  06/12/20 80  04/23/20 65  03/19/20 67   Wt Readings from Last 3 Encounters:  03/19/20 169 lb 9.6 oz (76.9 kg)  10/25/19 161 lb (73 kg)  06/23/19 156 lb (70.8 kg)   BMI Readings from Last 3 Encounters:  03/19/20 25.79 kg/m  10/25/19 24.48 kg/m  06/23/19 23.72 kg/m    Assessment/Interventions: Review of patient past medical history, allergies, medications, health status, including review of consultants reports,  laboratory and other test data, was performed as part of comprehensive evaluation and provision of chronic care management services.   SDOH:  (Social Determinants of Health) assessments and interventions performed: Yes  SDOH Screenings   Alcohol Screen: Low Risk    Last Alcohol Screening Score (AUDIT): 0  Depression (PHQ2-9): Low Risk    PHQ-2 Score: 0  Financial Resource Strain: Not on file  Food Insecurity: Not on file  Housing: Not on file  Physical Activity: Not on file  Social Connections: Not on file  Stress: Not on file  Tobacco Use: Medium Risk   Smoking Tobacco Use: Former   Smokeless Tobacco Use: Never  Transportation Needs: Not on file    [place]  Allergies  Allergen Reactions   Morphine And Related     UNSPECIFIED REACTION    Codeine Nausea And Vomiting    Medications Reviewed Today     Reviewed by Minette Headland, CMA (Certified Medical Assistant) on 06/12/20 at 1358  Med List Status: <None>   Medication Order Taking? Sig Documenting Provider Last Dose Status Informant  acetaminophen (TYLENOL) 500 MG tablet [id number] Yes Take 2 tablets (1,000 mg total) by mouth every 6 (six) hours. Elgie Collard, [place] Taking Active   aspirin 81 MG chewable tablet [id number] Yes Chew 1 tablet (81 mg total) by mouth daily. [name] M, PA-C Taking Active   atorvastatin (LIPITOR) 40 MG tablet [id number] Yes TAKE 1 TABLET (40 MG TOTAL) BY MOUTH DAILY AT 6 PM. Bensimhon, Shaune Pascal, MD Taking Active   clopidogrel (PLAVIX) 75 MG tablet [id number] Yes TAKE 1 TABLET (75 MG TOTAL) BY MOUTH DAILY. Bensimhon, Shaune Pascal, MD Taking Active   dapagliflozin propanediol (FARXIGA) 10 MG TABS tablet [id number] Yes TAKE 1 TABLET (10 MG TOTAL) BY MOUTH DAILY BEFORE BREAKFAST. Bensimhon, Shaune Pascal, MD Taking Active   digoxin (LANOXIN) 0.125 MG tablet [id number] Yes TAKE 1 TABLET (0.125 MG TOTAL) BY MOUTH DAILY. Bensimhon, Shaune Pascal, MD Taking Active   furosemide (LASIX) 20 MG tablet  [id number] Yes TAKE 1 TABLET (20 MG TOTAL) BY MOUTH DAILY. Bensimhon, Shaune Pascal, MD Taking Active   glipiZIDE (GLUCOTROL) 10 MG tablet [id number] Yes TAKE 1 TABLET BY MOUTH DAILY BEFORE BREAKFAST Birdie Sons, MD Taking Active   HYDROcodone-acetaminophen (NORCO/VICODIN) 5-325 MG tablet [id number] Yes Take 1 tablet by mouth every 6 (six) hours as needed for moderate pain. Birdie Sons, MD Taking Active   Lancet Devices [place] [id number] Yes Use to check blood sugar twice a day Birdie Sons, MD Taking Active Self  metFORMIN (GLUCOPHAGE-XR) 500 MG 24 hr  tablet [id number] Yes Take 1 tablet (500 mg total) by mouth daily. Birdie Sons, MD Taking Active   metoprolol succinate (TOPROL-XL) 50 MG 24 hr tablet [id number] Yes TAKE 1 TABLET (50 MG TOTAL) BY MOUTH DAILY. Bensimhon, Shaune Pascal, MD Taking Active   sacubitril-valsartan (ENTRESTO) 97-103 MG [id number] Yes TAKE 1 TABLET BY MOUTH 2 (TWO) TIMES DAILY. Bensimhon, Shaune Pascal, MD Taking Active   Semaglutide [facility]) 3 MG TABS [id number] Yes Take one tablet daily for 4 weeks, then increase to 2 tablets daily Birdie Sons, MD Taking Active   spironolactone (ALDACTONE) 25 MG tablet [id number] Yes TAKE 1 TABLET (25 MG TOTAL) BY MOUTH DAILY. Bensimhon, Shaune Pascal, MD Taking Active             Patient Active Problem List   Diagnosis Date Noted   S/P CABG x 5 04/01/2019   Ischemic cardiomyopathy 03/28/2019   3-vessel coronary artery disease 03/28/2019   HFrEF (heart failure with reduced ejection fraction) ([place]) 03/28/2019   Anemia 03/28/2019   Wheelchair bound 03/25/2019   Hyperlipidemia LDL goal <70 12/27/2018   Diabetes mellitus type 2 with retinopathy ([place]) 08/27/2018   Microalbuminuria 08/27/2018   Dyslipidemia 08/27/2018   Chronic pain of lower extremity 01/23/2015   History of kidney stones 01/23/2015   ED (erectile dysfunction) 01/23/2015    Immunization History  Administered Date(s) Administered   Fluad Quad(high Dose 65+) 10/25/2019    PFIZER Comirnaty(Gray Top)Covid-19 Tri-Sucrose Vaccine 06/12/2020   PFIZER(Purple Top)SARS-COV-2 Vaccination 06/10/2019, 07/01/2019   Pneumococcal Polysaccharide-23 12/09/2019    Conditions to be addressed/monitored:  Diabetes  There are no care plans that you recently modified to display for this patient.    Medication Assistance: Application for Farxiga, Rybelsus  medication assistance program. in process.  Anticipated assistance start date TBD.  See plan of care for additional detail.  Patient's preferred pharmacy is:  [facility] [address] [place] [address] Phone: [phone] Fax: [phone]  [facility] [place], [place] - [address] [place] [address] Phone: [phone] Fax: [phone]  [facility] [id number], [place] [address] Phone: [phone] Fax: [phone]  Care Plan and Follow Up Patient Decision:  Patient requests no follow-up at this time.  Plan: Patient does qualify for CCM services. CPP Consulted for assistance with medication costs.   Junius Argyle, PharmD, [place], [address] [phone]

## 2020-08-30 ENCOUNTER — Other Ambulatory Visit (HOSPITAL_COMMUNITY): Payer: Self-pay

## 2020-09-04 ENCOUNTER — Telehealth: Payer: Self-pay | Admitting: Family Medicine

## 2020-09-04 ENCOUNTER — Telehealth: Payer: Self-pay

## 2020-09-04 DIAGNOSIS — E1165 Type 2 diabetes mellitus with hyperglycemia: Secondary | ICD-10-CM

## 2020-09-04 MED ORDER — RYBELSUS 3 MG PO TABS: 2.0000 | ORAL_TABLET | Freq: Every day | ORAL | 0 refills | Status: DC

## 2020-09-04 NOTE — Telephone Encounter (Signed)
[name] advised. Samples placed up front.

## 2020-09-04 NOTE — Progress Notes (Signed)
Spoke to patient to check and see if he received the patient assistance application in the mail for  Farxiga, Rybelsus .  Patient wife states they did not received the forms in the mail.Patient wife states she call PCP this morning asking for samples of rybelsus because only has 2  tablets on hand as of 09/04/2020.Notified Clinical Pharmacist.   Per Clinical Pharmacist,Samples of Rybelsus and patient assistance applications for Farxgia,Rybelsus will be place at the front desk of Patient PCP office for patient to pick up.  Patient Verbalized understanding, and will pick it up on 09/05/2020.  Everlean Cherry Clinical Pharmacist Assistant [phone]

## 2020-09-04 NOTE — Telephone Encounter (Signed)
Yes he can have two boxes.

## 2020-09-04 NOTE — Telephone Encounter (Signed)
Pt wife is calling and would like to pick up more samples of rybelsus. Pt only has 2 pills left for tomorrow morning

## 2020-09-04 NOTE — Telephone Encounter (Signed)
It is okay to leave samples if available? ° °Thanks,  ° °-Kayler Buckholtz  °

## 2020-09-10 ENCOUNTER — Emergency Department
Admission: EM | Admit: 2020-09-10 | Discharge: 2020-09-11 | Disposition: A | Discharge status: Home / Self Care | Payer: Self-pay | Attending: Emergency Medicine | Admitting: Emergency Medicine

## 2020-09-10 ENCOUNTER — Ambulatory Visit: Payer: Self-pay | Admitting: *Deleted

## 2020-09-10 ENCOUNTER — Other Ambulatory Visit: Payer: Self-pay

## 2020-09-10 ENCOUNTER — Emergency Department: Payer: Self-pay

## 2020-09-10 ENCOUNTER — Encounter: Payer: Self-pay | Admitting: Emergency Medicine

## 2020-09-10 DIAGNOSIS — Z7984 Long term (current) use of oral hypoglycemic drugs: Secondary | ICD-10-CM | POA: Insufficient documentation

## 2020-09-10 DIAGNOSIS — I509 Heart failure, unspecified: Secondary | ICD-10-CM | POA: Insufficient documentation

## 2020-09-10 DIAGNOSIS — Z87891 Personal history of nicotine dependence: Secondary | ICD-10-CM | POA: Insufficient documentation

## 2020-09-10 DIAGNOSIS — I2510 Atherosclerotic heart disease of native coronary artery without angina pectoris: Secondary | ICD-10-CM | POA: Insufficient documentation

## 2020-09-10 DIAGNOSIS — I110 Hypertensive heart disease with heart failure: Secondary | ICD-10-CM | POA: Insufficient documentation

## 2020-09-10 DIAGNOSIS — Z20822 Contact with and (suspected) exposure to covid-19: Secondary | ICD-10-CM | POA: Insufficient documentation

## 2020-09-10 DIAGNOSIS — Z7902 Long term (current) use of antithrombotics/antiplatelets: Secondary | ICD-10-CM | POA: Insufficient documentation

## 2020-09-10 DIAGNOSIS — Z79899 Other long term (current) drug therapy: Secondary | ICD-10-CM | POA: Insufficient documentation

## 2020-09-10 DIAGNOSIS — Z951 Presence of aortocoronary bypass graft: Secondary | ICD-10-CM | POA: Insufficient documentation

## 2020-09-10 DIAGNOSIS — E119 Type 2 diabetes mellitus without complications: Secondary | ICD-10-CM | POA: Insufficient documentation

## 2020-09-10 DIAGNOSIS — N390 Urinary tract infection, site not specified: Secondary | ICD-10-CM | POA: Insufficient documentation

## 2020-09-10 DIAGNOSIS — Z7982 Long term (current) use of aspirin: Secondary | ICD-10-CM | POA: Insufficient documentation

## 2020-09-10 LAB — HEPATIC FUNCTION PANEL
ALT: 18 U/L (ref 0–44)
AST: 12 U/L — ABNORMAL LOW (ref 15–41)
Albumin: 3 g/dL — ABNORMAL LOW (ref 3.5–5.0)
Alkaline Phosphatase: 76 U/L (ref 38–126)
Bilirubin, Direct: <0.1 mg/dL (ref 0.0–0.2)
Total Bilirubin: 0.7 mg/dL (ref 0.3–1.2)
Total Protein: 8 g/dL (ref 6.5–8.1)

## 2020-09-10 LAB — CBC
HCT: 32.8 % — ABNORMAL LOW (ref 39.0–52.0)
Hemoglobin: 10.8 g/dL — ABNORMAL LOW (ref 13.0–17.0)
MCH: 30.6 pg (ref 26.0–34.0)
MCHC: 32.9 g/dL (ref 30.0–36.0)
MCV: 92.9 fL (ref 80.0–100.0)
Platelets: 332 10*3/uL (ref 150–400)
RBC: 3.53 MIL/uL — ABNORMAL LOW (ref 4.22–5.81)
RDW: 14.1 % (ref 11.5–15.5)
WBC: 12.6 10*3/uL — ABNORMAL HIGH (ref 4.0–10.5)
nRBC: 0 % (ref 0.0–0.2)

## 2020-09-10 LAB — BASIC METABOLIC PANEL
Anion gap: 12 (ref 5–15)
BUN: 49 mg/dL — ABNORMAL HIGH (ref 8–23)
CO2: 20 mmol/L — ABNORMAL LOW (ref 22–32)
Calcium: 8.9 mg/dL (ref 8.9–10.3)
Chloride: 97 mmol/L — ABNORMAL LOW (ref 98–111)
Creatinine, Ser: 1.36 mg/dL — ABNORMAL HIGH (ref 0.61–1.24)
GFR, Estimated: 58 mL/min — ABNORMAL LOW (ref >60)
Glucose, Bld: 190 mg/dL — ABNORMAL HIGH (ref 70–99)
Potassium: 4.9 mmol/L (ref 3.5–5.1)
Sodium: 129 mmol/L — ABNORMAL LOW (ref 135–145)

## 2020-09-10 LAB — URINALYSIS, COMPLETE (UACMP) WITH MICROSCOPIC
Bilirubin Urine: NEGATIVE
Glucose, UA: >=500 mg/dL — AB
Ketones, ur: NEGATIVE mg/dL
Nitrite: NEGATIVE
Protein, ur: 100 mg/dL — AB
Specific Gravity, Urine: 1.013 (ref 1.005–1.030)
Squamous Epithelial / HPF: NONE SEEN (ref 0–5)
WBC, UA: >50 WBC/hpf — ABNORMAL HIGH (ref 0–5)
pH: 5 (ref 5.0–8.0)

## 2020-09-10 LAB — RESP PANEL BY RT-PCR (FLU A&B, COVID) ARPGX2
Influenza A by PCR: NEGATIVE
Influenza B by PCR: NEGATIVE
SARS Coronavirus 2 by RT PCR: NEGATIVE

## 2020-09-10 LAB — TROPONIN I (HIGH SENSITIVITY)
Troponin I (High Sensitivity): 17 ng/L (ref <18)
Troponin I (High Sensitivity): 16 ng/L (ref <18)

## 2020-09-10 LAB — LIPASE, BLOOD: Lipase: 51 U/L (ref 11–51)

## 2020-09-10 MED ORDER — SODIUM CHLORIDE 0.9 % IV BOLUS
500.0000 mL | Freq: Once | INTRAVENOUS | Status: AC
  Administered 2020-09-10: 500 mL via INTRAVENOUS

## 2020-09-10 MED ORDER — SODIUM CHLORIDE 0.9 % IV SOLN
1.0000 g | Freq: Once | INTRAVENOUS | Status: AC
  Administered 2020-09-10: 1 g via INTRAVENOUS
  Filled 2020-09-10: qty 10

## 2020-09-10 NOTE — ED Triage Notes (Signed)
Pt comes into the ED via POV c/o emesis  since Saturday.  Pt states he doesn't have any abdominal pain, but he feels "bloated".  Pt is having overall weakness from the vomiting and has concerns for dehydration.  Pt denies any CP, SHOB, or dizziness.  Per the wife his CBG was 350, but it is slowly has been coming down.  Pt also states his urine has been moire pungent and darker in color than normal.

## 2020-09-10 NOTE — Telephone Encounter (Signed)
Patients wife was advised to take patient to ED today. Curtis [name]

## 2020-09-10 NOTE — Telephone Encounter (Signed)
Reason for Disposition  Patient sounds very sick or weak to the triager  Answer Assessment - Initial Assessment Questions 1. LOCATION: "Where does it hurt?"      [place] 2. RADIATION: "Does the pain shoot anywhere else?" (e.g., chest, back)     *No Answer* 3. ONSET: "When did the pain begin?" (Minutes, hours or days ago)      *No Answer* 4. SUDDEN: "Gradual or sudden onset?"     *No Answer* 5. PATTERN "Does the pain come and go, or is it constant?"    - If constant: "Is it getting better, staying the same, or worsening?"      (Note: Constant means the pain never goes away completely; most serious pain is constant and it progresses)     - If intermittent: "How long does it last?" "Do you have pain now?"     (Note: Intermittent means the pain goes away completely between bouts)     *No Answer* 6. SEVERITY: "How bad is the pain?"  (e.g., Scale 1-10; mild, moderate, or severe)    - MILD (1-3): doesn't interfere with normal activities, abdomen soft and not tender to touch     - MODERATE (4-7): interferes with normal activities or awakens from sleep, abdomen tender to touch     - SEVERE (8-10): excruciating pain, doubled over, unable to do any normal activities       Moderate  7. RECURRENT SYMPTOM: "Have you ever had this type of stomach pain before?" If Yes, ask: "When was the last time?" and "What happened that time?"      *No Answer* 8. CAUSE: "What do you think is causing the stomach pain?"     *No Answer* 9. RELIEVING/AGGRAVATING FACTORS: "What makes it better or worse?" (e.g., movement, antacids, bowel movement)     *No Answer* 10. OTHER SYMPTOMS: "Do you have any other symptoms?" (e.g., back pain, diarrhea, fever, urination pain, vomiting)       *No Answer*  Answer Assessment - Initial Assessment Questions 1. DESCRIPTION: "Describe how you are feeling."     "No energy' sick since 08/31/20 2. SEVERITY: "How bad is it?"  "Can you stand and walk?"   - MILD - Feels weak or tired, but  does not interfere with work, school or normal activities   - MODERATE - Able to stand and walk; weakness interferes with work, school, or normal activities   - SEVERE - Unable to stand or walk     Moderate to severe. Needs help to stand and transfer 3. ONSET:  "When did the weakness begin?"     After 08/31/20 4. CAUSE: "What do you think is causing the weakness?"     Sickness, vomiting , fever, abdominal pain, unable to eat can drink  5. MEDICINES: "Have you recently started a new medicine or had a change in the amount of a medicine?"     no 6. OTHER SYMPTOMS: "Do you have any other symptoms?" (e.g., chest pain, fever, cough, SOB, vomiting, diarrhea, bleeding, other areas of pain)     Abdominal pain, no energy, skin sore , hiccups/belching  7. PREGNANCY: "Is there any chance you are pregnant?" "When was your last menstrual period?"     na  Protocols used: Abdominal Pain - Male-A-AH, Weakness (Generalized) and Fatigue-A-AH

## 2020-09-10 NOTE — ED Notes (Signed)
Pt had diarrhea two weeks ago and stated he has not felt good since then; stated he has not eaten anything in a day and feels weak; denies cp.

## 2020-09-10 NOTE — ED Provider Notes (Signed)
[facility] Emergency Department Provider Note  ____________________________________________   Event Date/Time   First MD Initiated Contact with Patient 09/10/20 2151     (approximate)  I have reviewed the triage vital signs and the nursing notes.   HISTORY  Chief Complaint Emesis, Fever, and Weakness    HPI Cherre RobinsWilliam M Filip is a 65 y.o. male with diabetes, heart failure, coronary disease who comes in with not feeling well since August 11th.  Patient is present with his wife.  They state that he started not feeling well on the 11th.  He was having some fevers, nausea, diarrhea vomiting little bit of abdominal distention.  This is all since resolved but he still does not really feel hungry and has not been eating as much.  He states that he feels a little nauseous.  This is been constant.  Is been able to tolerate some applesauce.  Nothing makes it better or worse.  He denies any abdominal tenderness.  Denies any chest pain, shortness of breath, back pain.  They state that his urine looked a little bit darker and that they were concerned that he might be dehydrated which is why he came in today.  To note patient is wheelchair-bound at baseline from prior accident.           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   Ischemic cardiomyopathy 03/28/2019   3-vessel coronary artery disease 03/28/2019   HFrEF (heart failure with reduced ejection fraction) (HCC) 03/28/2019   Anemia 03/28/2019   Wheelchair bound 03/25/2019   Hyperlipidemia LDL goal <70 12/27/2018    Diabetes mellitus type 2 with retinopathy (HCC) 08/27/2018   Microalbuminuria 08/27/2018   Dyslipidemia 08/27/2018   Chronic pain of lower extremity 01/23/2015   History of kidney stones 01/23/2015   ED (erectile dysfunction) 01/23/2015    Past Surgical History:  Procedure Laterality Date   CHOLECYSTECTOMY  05/12/2011   CORONARY ARTERY BYPASS GRAFT N/A 04/01/2019   Procedure: CORONARY ARTERY BYPASS GRAFTING (CABG) X 5 USING BILATERAL IMA  AND LEFT RADIAL ARTERY;  Surgeon: Linden DolinAtkins, Broadus Z, MD;  Location: [facility] OR;  Service: Open Heart Surgery;  Laterality: N/A;  BILATERAL IMA   HAND RECONSTRUCTION     HEMORRHOID SURGERY  10/31/2011   [facility]   RADIAL ARTERY HARVEST Left 04/01/2019   Procedure: RADIAL ARTERY HARVEST (OPEN HARVEST);  Surgeon: Linden DolinAtkins, Broadus Z, MD;  [facility];  Service: Open Heart Surgery;  Laterality: Left;   RIGHT/LEFT HEART CATH AND CORONARY ANGIOGRAPHY N/A 03/28/2019   Procedure: RIGHT/LEFT HEART CATH AND CORONARY ANGIOGRAPHY;  Surgeon: Iran OuchArida, Muhammad A, MD;  Location: [facility] INVASIVE CV LAB;  Service: Cardiovascular;  Laterality: N/A;   TEE WITHOUT CARDIOVERSION N/A 04/01/2019   Procedure: TRANSESOPHAGEAL ECHOCARDIOGRAM (TEE);  Surgeon: Linden DolinAtkins, Broadus Z, MD;  [facility];  Service: Open Heart Surgery;  Laterality: N/A;    Prior to Admission medications   Medication Sig Start Date End Date Taking? Authorizing Provider  acetaminophen (TYLENOL) 500 MG tablet Take 2 tablets (1,000  mg total) by mouth every 6 (six) hours. 04/05/19   Sharlene Dory, PA-C  aspirin 81 MG chewable tablet Chew 1 tablet (81 mg total) by mouth daily. 04/21/19   Robbie Lis M, PA-C  atorvastatin (LIPITOR) 40 MG tablet TAKE 1 TABLET (40 MG TOTAL) BY MOUTH DAILY AT 6 PM. 06/29/20   Bensimhon, Bevelyn Buckles, MD  clopidogrel (PLAVIX) 75 MG tablet Take 1 tablet (75 mg total) by mouth daily. 06/29/20   Bensimhon, Bevelyn Buckles, MD  dapagliflozin propanediol (FARXIGA) 10 MG TABS tablet Take 1 tablet (10 mg  total) by mouth daily before breakfast. 06/20/20   Bensimhon, Bevelyn Buckles, MD  digoxin (LANOXIN) 0.125 MG tablet Take 1 tablet (0.125 mg total) by mouth daily. 06/29/20   Bensimhon, Bevelyn Buckles, MD  furosemide (LASIX) 20 MG tablet Take 1 tablet (20 mg total) by mouth daily. 06/29/20   Bensimhon, Bevelyn Buckles, MD  glipiZIDE (GLUCOTROL) 10 MG tablet TAKE 1 TABLET BY MOUTH DAILY BEFORE BREAKFAST 04/21/20   Malva Limes, MD  HYDROcodone-acetaminophen (NORCO/VICODIN) 5-325 MG tablet Take 1 tablet by mouth every 6 (six) hours as needed for moderate pain. 06/05/20   Malva Limes, MD  Lancet Devices MISC Use to check blood sugar twice a day 04/26/18   Malva Limes, MD  metFORMIN (GLUCOPHAGE-XR) 500 MG 24 hr tablet Take 1 tablet (500 mg total) by mouth daily. 04/25/20   Malva Limes, MD  metoprolol succinate (TOPROL-XL) 50 MG 24 hr tablet Take 1 tablet (50 mg total) by mouth daily. 06/20/20   Bensimhon, Bevelyn Buckles, MD  sacubitril-valsartan (ENTRESTO) 97-103 MG TAKE 1 TABLET BY MOUTH 2 (TWO) TIMES DAILY. 03/19/20 03/19/21  Bensimhon, Bevelyn Buckles, MD  Semaglutide (RYBELSUS) 3 MG TABS Take 2 tablets by mouth daily. 09/04/20   Malva Limes, MD  spironolactone (ALDACTONE) 25 MG tablet Take 1 tablet (25 mg total) by mouth daily. 06/29/20   Bensimhon, Bevelyn Buckles, MD    Allergies Morphine and related and Codeine  Family History  Family history unknown: Yes    Social History Social History   Tobacco Use   Smoking status: Former    Packs/day: 0.75    Types: Cigarettes    Quit date: 04/26/2018    Years since quitting: 2.3   Smokeless tobacco: Never   Tobacco comments:    2-3 ppd since 65yo. cut back to under a ppd since around 2010  Substance Use Topics   Alcohol use: Not Currently    Alcohol/week: 0.0 standard drinks    Comment: social   Drug use: No      Review of Systems Constitutional: No fever/chills, weakness Eyes: No visual changes. ENT: No sore throat. Cardiovascular: Denies chest  pain. Respiratory: Denies shortness of breath. Gastrointestinal: Decreased appetite, distention Genitourinary: Negative for dysuria. Musculoskeletal: Negative for back pain. Skin: Negative for rash. Neurological: Negative for headaches, focal weakness or numbness. All other ROS negative ____________________________________________   PHYSICAL EXAM:  VITAL SIGNS: ED Triage Vitals [09/10/20 1448]  Enc Vitals Group     BP (!) 102/51     Pulse Rate 82     Resp 20     Temp 98.5 F (36.9 C)     Temp Source Oral     SpO2 99 %     Weight 169 lb 8.5 oz (76.9 kg)     Height 5\' 8"  (1.727 m)     Head Circumference      Peak Flow      Pain Score  0     Pain Loc      Pain Edu?      Excl. in GC?     Constitutional: Alert and oriented. Well appearing and in no acute distress. Eyes: Conjunctivae are normal. EOMI. Head: Atraumatic. Nose: No congestion/rhinnorhea. Mouth/Throat: Mucous membranes are moist.   Neck: No stridor. Trachea Midline. FROM Cardiovascular: Normal rate, regular rhythm. Grossly normal heart sounds.  Good peripheral circulation. Respiratory: Normal respiratory effort.  No retractions. Lungs CTAB. Gastrointestinal: Soft and nontender. No distention. No abdominal bruits.  Musculoskeletal: No lower extremity tenderness nor edema.  No joint effusions. Neurologic:  Normal speech and language. No gross focal neurologic deficits are appreciated.  Skin:  Skin is warm, dry and intact. No rash noted. Psychiatric: Mood and affect are normal. Speech and behavior are normal. GU: Deferred   ____________________________________________   LABS (all labs ordered are listed, but only abnormal results are displayed)  Labs Reviewed  BASIC METABOLIC PANEL - Abnormal; Notable for the following components:      Result Value   Sodium 129 (*)    Chloride 97 (*)    CO2 20 (*)    Glucose, Bld 190 (*)    BUN 49 (*)    Creatinine, Ser 1.36 (*)    GFR, Estimated 58 (*)    All other  components within normal limits  CBC - Abnormal; Notable for the following components:   WBC 12.6 (*)    RBC 3.53 (*)    Hemoglobin 10.8 (*)    HCT 32.8 (*)    All other components within normal limits  URINALYSIS, COMPLETE (UACMP) WITH MICROSCOPIC  CBG MONITORING, ED  TROPONIN I (HIGH SENSITIVITY)  TROPONIN I (HIGH SENSITIVITY)   ____________________________________________   ED ECG REPORT I, Concha Se, the attending physician, personally viewed and interpreted this ECG.  Normal sinus rate of 85, no ST elevation, does have some T wave inversions in 2 3 aVF V5 and V6, type I AV block  Reviewed prior EKG and he has similar T wave inversions ____________________________________________  RADIOLOGY   Official radiology report(s): CT Renal Stone Study  Result Date: 09/11/2020 CLINICAL DATA:  Hematuria of unknown cause. Emesis since Saturday. Bloating. EXAM: CT ABDOMEN AND PELVIS WITHOUT CONTRAST TECHNIQUE: Multidetector CT imaging of the abdomen and pelvis was performed following the standard protocol without IV contrast. COMPARISON:  10/15/2012 FINDINGS: Lower chest: Severe emphysematous changes in the right lung base. Hepatobiliary: No focal liver abnormality is seen. Status post cholecystectomy. No biliary dilatation. Pancreas: Unremarkable. No pancreatic ductal dilatation or surrounding inflammatory changes. Spleen: Calcified granulomas. Adrenals/Urinary Tract: No adrenal gland nodules. 3 mm stone in the lower pole left kidney. No hydronephrosis or hydroureter. No ureteral or bladder stones identified. No bladder wall thickening. Parenchymal scarring in the kidneys most prominent on the left. Stomach/Bowel: Stomach is within normal limits. Appendix appears normal. No evidence of bowel wall thickening, distention, or inflammatory changes. Vascular/Lymphatic: Diffuse calcification of the abdominal aorta. Infrarenal abdominal aortic aneurysm measuring 4.3 cm AP diameter. No significant  lymphadenopathy. Reproductive: Prostate gland is enlarged, measuring 4.9 cm diameter. Other: No free air or free fluid in the abdomen. Abdominal wall musculature appears intact. Musculoskeletal: Degenerative changes in the spine. Internal fixation of an old right proximal femoral fracture. No acute bony abnormalities. IMPRESSION: 1. Nonobstructing stone in the right kidney. No ureteral stone or obstruction. Bilateral renal parenchymal scarring. 2. Severe emphysematous changes in the right lower lung. 3. Aortic atherosclerosis. 4. Infrarenal abdominal aortic aneurysm measuring  4.3 cm AP diameter. Recommend follow-up every 12 months and vascular consultation. This recommendation follows ACR consensus guidelines: White Paper of the ACR Incidental Findings Committee II on Vascular Findings. J Am Coll Radiol 2013; 10:789-794. 5. Prostate enlargement. Electronically Signed   By: Burman Nieves M.D.   On: 09/11/2020 00:00    ____________________________________________   PROCEDURES  Procedure(s) performed (including Critical Care):  Procedures   ____________________________________________   INITIAL IMPRESSION / ASSESSMENT AND PLAN / ED COURSE  ANTWAIN CALIENDO was evaluated in Emergency Department on 09/10/2020 for the symptoms described in the history of present illness. He was evaluated in the context of the global COVID-19 pandemic, which necessitated consideration that the patient might be at risk for infection with the SARS-CoV-2 virus that causes COVID-19. Institutional protocols and algorithms that pertain to the evaluation of patients at risk for COVID-19 are in a state of rapid change based on information released by regulatory bodies including the CDC and federal and state organizations. These policies and algorithms were followed during the patient's care in the ED.    Patient is a 65 year old who comes in with weakness, vomiting, fevers.  The symptoms have been going on for a while but mostly  have resolved except for some continued weakness and fatigue.  Labs ordered to evaluate for Electra abnormalities, AKI.  Added on some labs just to make sure no evidence of liver dysfunction or pancreatitis.  On exam his abdomen is soft and nontender but I discussed with him that the continued not wanting to eat is a little odd and that we could consider CT imaging to make sure there is no mass or obstruction but he is adamant that he is not having any pain he just feels like he is dehydrated.  Declined CT imaging at this time.  They would like to start off with fluids.  They also get a urine to make sure no UTI.  Patient is kidney function is elevated at 1.36 and his sodium is 129 which is consistent with some dehydration.  Given patient's heart failure history we will give 500 cc of fluid.  His white count is elevated which is concerning for the possibility of infection.  His initial troponin was reassuring x2.  Urine looks consistent with UTI given the blood in the urine history of kidney stones we will get CT scan due to patient having some remittent fevers and back pain at home I want to make sure there is no signs of infected kidney stone.  CT was negative for obstructive kidney stone.  Some incidental findings that I provided a copy of report for family instructed them to discuss with primary care doctor.  We will give a course of antibiotics for UTI and Zofran to help with nausea.  Patient tolerating p.o. and feels comfortable with discharge home       ____________________________________________   FINAL CLINICAL IMPRESSION(S) / ED DIAGNOSES   Final diagnoses:  Lower urinary tract infectious disease      MEDICATIONS GIVEN DURING THIS VISIT:  Medications  sodium chloride 0.9 % bolus 500 mL (500 mLs Intravenous Bolus 09/10/20 2217)  cefTRIAXone (ROCEPHIN) 1 g in sodium chloride 0.9 % 100 mL IVPB (0 g Intravenous Stopped 09/11/20 0004)     ED Discharge Orders          Ordered     cefpodoxime (VANTIN) 200 MG tablet  2 times daily        09/11/20 0015    ondansetron (ZOFRAN ODT)  4 MG disintegrating tablet  Every 8 hours PRN        09/11/20 0015             Note:  This document was prepared using Dragon voice recognition software and may include unintentional dictation errors.    Concha Se, MD 09/11/20 [phone]

## 2020-09-10 NOTE — Telephone Encounter (Signed)
Patient 's wife called to report patient has been sick since 08/31/20. Symptoms were vomiting, fever 101.5, UTI symptoms, "rusty color urine and foul odor, elevated glucose readings greater than 300. At home covid test negative 09/05/20. Called patient 's wife and spoke to patient as well. C/o "no energy", skin sore to touch, abdominal bloating, hiccups/ belching. Temperature today 98.1, blood glucose this am 230. Patient not vomiting now and unable to take medications due to feeling of vomiting when he tries to eat or take medications. Diarrhea reported last Thursday 09/06/20. Wife reports patient unable to eat and only drinking diet coke and some gingerale. Refuses water, makes him "sick". Denies chest pain, difficulty breathing. Reports weakness and unable to stand and transfer without assistance. Instructed patient's wife to take patient to ED. Patient requesting PCP input. Patient reports he is very weak , but urine is now clear and no odor noted. Care advise given. Patient 's wife verbalized understanding of care advise and to go to ED or call 911 if symptoms worsen, but patient does not want to go in ED and wait for hours to be seen. Please advise .

## 2020-09-11 MED ORDER — CEFPODOXIME PROXETIL 200 MG PO TABS: 200.0000 mg | ORAL_TABLET | Freq: Two times a day (BID) | ORAL | 0 refills | Status: AC

## 2020-09-11 MED ORDER — ONDANSETRON 4 MG PO TBDP: 4.0000 mg | ORAL_TABLET | Freq: Three times a day (TID) | ORAL | 0 refills | Status: DC | PRN

## 2020-09-11 NOTE — Discharge Instructions (Addendum)
We are treating you for a UTI.  Take the antibiotics for completion.  Take Zofran to help with nausea.  Try to stay well-hydrated given there was signs of some dehydration.  Return to the ER for fevers, worsening pain or any other concerns.  We have also recommend you    IMPRESSION: 1. Nonobstructing stone in the right kidney. No ureteral stone or obstruction. Bilateral renal parenchymal scarring. 2. Severe emphysematous changes in the right lower lung. 3. Aortic atherosclerosis. 4. Infrarenal abdominal aortic aneurysm measuring 4.3 cm AP diameter. Recommend follow-up every 12 months and vascular consultation. This recommendation follows ACR consensus guidelines: White Paper of the ACR Incidental Findings Committee II on Vascular Findings. J Am Coll Radiol 2013; 10:789-794. 5. Prostate enlargement.

## 2020-09-11 NOTE — ED Notes (Signed)
Patient requesting something to eat, provider states ok to PO challenge.  Diet cola, applesauce and crackers given. Patient is sitting up in bed no distress noted.

## 2020-09-13 LAB — URINE CULTURE: Culture: >=100000 — AB

## 2020-09-14 NOTE — Progress Notes (Signed)
ED Antimicrobial Stewardship Positive Culture Follow Up   Curtis [name] is an 65 y.o. male who presented to [facility] on 09/10/2020 with a chief complaint of  Chief Complaint  Patient presents with   Emesis   Fever   Weakness    Recent Results (from the past 720 hour(s))  Resp Panel by RT-PCR (Flu A&B, Covid) Nasopharyngeal Swab     Status: None   Collection Time: 09/10/20 10:16 PM   Specimen: Nasopharyngeal Swab; Nasopharyngeal(NP) swabs in vial transport medium  Result Value Ref Range Status   SARS Coronavirus 2 by RT PCR NEGATIVE NEGATIVE Final    Comment: (NOTE) SARS-CoV-2 target nucleic acids are NOT DETECTED.  The SARS-CoV-2 RNA is generally detectable in upper respiratory specimens during the acute phase of infection. The lowest concentration of SARS-CoV-2 viral copies this assay can detect is 138 copies/mL. A negative result does not preclude SARS-Cov-2 infection and should not be used as the sole basis for treatment or other patient management decisions. A negative result may occur with  improper specimen collection/handling, submission of specimen other than nasopharyngeal swab, presence of viral mutation(s) within the areas targeted by this assay, and inadequate number of viral copies(<138 copies/mL). A negative result must be combined with clinical observations, patient history, and epidemiological information. The expected result is Negative.  Fact Sheet for Patients:  BloggerCourse.com  Fact Sheet for Healthcare Providers:  SeriousBroker.it  This test is no t yet approved or cleared by the [place] FDA and  has been authorized for detection and/or diagnosis of SARS-CoV-2 by FDA under an Emergency Use Authorization (EUA). This EUA will remain  in effect (meaning this test can be used) for the duration of the COVID-19 declaration under Section 564(b)(1) of the Act, 21 U.S.C.section 360bbb-3(b)(1), unless  the authorization is terminated  or revoked sooner.       Influenza A by PCR NEGATIVE NEGATIVE Final   Influenza B by PCR NEGATIVE NEGATIVE Final    Comment: (NOTE) The Xpert Xpress SARS-CoV-2/FLU/RSV plus assay is intended as an aid in the diagnosis of influenza from Nasopharyngeal swab specimens and should not be used as a sole basis for treatment. Nasal washings and aspirates are unacceptable for Xpert Xpress SARS-CoV-2/FLU/RSV testing.  Fact Sheet for Patients: BloggerCourse.com  Fact Sheet for Healthcare Providers: SeriousBroker.it  This test is not yet approved or cleared by the [place] FDA and has been authorized for detection and/or diagnosis of SARS-CoV-2 by FDA under an Emergency Use Authorization (EUA). This EUA will remain in effect (meaning this test can be used) for the duration of the COVID-19 declaration under Section 564(b)(1) of the Act, 21 U.S.C. section 360bbb-3(b)(1), unless the authorization is terminated or revoked.  Performed at [facility], [address] [place], [place] [address]   Urine Culture     Status: Abnormal   Collection Time: 09/10/20 10:30 PM   Specimen: Urine, Random  Result Value Ref Range Status   Specimen Description   Final    URINE, RANDOM Performed at [facility], [address], [place], [place] [address]    Special Requests   Final    NONE Performed at [facility], [address] [place], [place] [address]    Culture >=100,000 COLONIES/mL SERRATIA MARCESCENS (A)  Final   Report Status 09/13/2020 FINAL  Final   Organism ID, Bacteria SERRATIA MARCESCENS (A)  Final      Susceptibility   Serratia marcescens - MIC*    CEFAZOLIN >=64  RESISTANT Resistant     CEFEPIME <=0.12 SENSITIVE Sensitive     CEFTRIAXONE 1 SENSITIVE Sensitive     CIPROFLOXACIN <=0.25 SENSITIVE Sensitive     GENTAMICIN <=1 SENSITIVE Sensitive     NITROFURANTOIN 256  RESISTANT Resistant     TRIMETH/SULFA <=20 SENSITIVE Sensitive     * >=100,000 COLONIES/mL SERRATIA MARCESCENS    Reviewed antibiotic and culture with Juliette Alcide ID Pharmacist. No adjustment at this time   Herley Bernardini A 09/14/2020, 3:44 PM Clinical Pharmacist

## 2020-09-18 ENCOUNTER — Other Ambulatory Visit (HOSPITAL_COMMUNITY): Payer: Self-pay

## 2020-10-01 ENCOUNTER — Telehealth: Payer: Self-pay | Admitting: Family Medicine

## 2020-10-01 ENCOUNTER — Other Ambulatory Visit (HOSPITAL_COMMUNITY): Payer: Self-pay

## 2020-10-01 DIAGNOSIS — G8929 Other chronic pain: Secondary | ICD-10-CM

## 2020-10-01 DIAGNOSIS — E1165 Type 2 diabetes mellitus with hyperglycemia: Secondary | ICD-10-CM

## 2020-10-01 NOTE — Telephone Encounter (Signed)
Medication Refill - Medication:   HYDROcodone-acetaminophen (NORCO/VICODIN) 5-325 MG tablet   Semaglutide (RYBELSUS) 3 MG TABS  Has the patient contacted their pharmacy? Yes.  No refills left.    Preferred Pharmacy (with phone number or street name):   [id number] - [place], [place] - [address] AT [facility] OF [address] [facility]  [address] [place] [address]  Phone: [phone] Fax: [phone]    Agent: Please be advised that RX refills may take up to 3 business days. We ask that you follow-up with your pharmacy.

## 2020-10-02 LAB — HM DIABETES EYE EXAM

## 2020-10-02 MED ORDER — HYDROCODONE-ACETAMINOPHEN 5-325 MG PO TABS: 1.0000 | ORAL_TABLET | Freq: Four times a day (QID) | ORAL | 0 refills | Status: DC | PRN

## 2020-10-02 MED ORDER — RYBELSUS 7 MG PO TABS: 7.0000 mg | ORAL_TABLET | Freq: Every day | ORAL | 1 refills | Status: DC

## 2020-10-02 NOTE — Telephone Encounter (Signed)
Requested medications are due for refill today.  yes  Requested medications are on the active medications list.  yes  Last refill. Norco 06/05/2020, Rybelsus 09/04/2020  Future visit scheduled.   yes  Notes to clinic.  Medication not delegated. No protocol assigned.

## 2020-10-02 NOTE — Telephone Encounter (Signed)
Have sent prescription for 7mg  Rybelsus, will only need to take one daily. (Was previously taking samples of the 3mg ). Call if any trouble filling prescription. Be sure to keep follow up appt. 10-29-20

## 2020-10-02 NOTE — Addendum Note (Signed)
Addended by: Malva Limes on: 10/02/2020 04:45 PM   Modules accepted: Orders

## 2020-10-02 NOTE — Telephone Encounter (Signed)
Continue the samples for now. Once we know what dose works best for him will set him up with patient assistance.

## 2020-10-02 NOTE — Telephone Encounter (Signed)
Mrs Blankenhorn advised.  She states Rybelsus is going to cost over $1,000.  (He does not have insurance)  Is there another alternative?  Thanks,   -Vernona Rieger

## 2020-10-03 ENCOUNTER — Encounter: Payer: Self-pay | Admitting: Family Medicine

## 2020-10-03 NOTE — Telephone Encounter (Signed)
Patient advised and agrees to continue taking Rybelsus. Patient states he only has enough medication to get by for the next 15 days. Patient wants to know what to do when he runs out of the medication?

## 2020-10-03 NOTE — Telephone Encounter (Signed)
Patient advised. Samples placed up front.  

## 2020-10-03 NOTE — Telephone Encounter (Signed)
You can save another box of samples for him to pick up next week. Than should get him by until his appointment in October.

## 2020-10-16 ENCOUNTER — Ambulatory Visit: Payer: Self-pay | Admitting: Family Medicine

## 2020-10-18 ENCOUNTER — Other Ambulatory Visit (HOSPITAL_COMMUNITY): Payer: Self-pay

## 2020-10-21 ENCOUNTER — Other Ambulatory Visit: Payer: Self-pay | Admitting: Family Medicine

## 2020-10-21 DIAGNOSIS — E1165 Type 2 diabetes mellitus with hyperglycemia: Secondary | ICD-10-CM

## 2020-10-21 NOTE — Telephone Encounter (Signed)
Requested medication (s) are due for refill today: yes  Requested medication (s) are on the active medication list: yes  Last refill:  04/21/20 #90  Future visit scheduled: yes  Notes to clinic:  Hgb A1C was in August   Requested Prescriptions  Pending Prescriptions Disp Refills   glipiZIDE (GLUCOTROL) 10 MG tablet [Pharmacy Med Name: GLIPIZIDE 10MG  TABLETS] 180 tablet     Sig: TAKE 1 TABLET(10 MG) BY MOUTH TWICE DAILY BEFORE A MEAL     Endocrinology:  Diabetes - Sulfonylureas Failed - 10/21/2020  1:51 PM      Failed - HBA1C is between 0 and 7.9 and within 180 days    Hemoglobin A1C  Date Value Ref Range Status  06/12/2020 9.3 (A) 4.0 - 5.6 % Final  03/19/2018 >14.0  Final   Hgb A1c MFr Bld  Date Value Ref Range Status  03/25/2019 7.7 (H) 4.8 - 5.6 % Final    Comment:    (NOTE) Pre diabetes:          5.7%-6.4% Diabetes:              >6.4% Glycemic control for   <7.0% adults with diabetes           Passed - Valid encounter within last 6 months    Recent Outpatient Visits           4 months ago Uncontrolled type 2 diabetes mellitus with hyperglycemia [facility])   [facility], MD   6 months ago Uncontrolled type 2 diabetes mellitus with hyperglycemia [facility])   [facility], MD   10 months ago Uncontrolled type 2 diabetes mellitus with hyperglycemia [facility])   [facility], MD   12 months ago Uncontrolled type 2 diabetes mellitus with hyperglycemia [facility])   [facility], MD   1 year ago Controlled type 2 diabetes mellitus without complication, without long-term current use of insulin [facility])   [facility] [place], [place], Lavella Hammock       Future Appointments             In 1 week [place], Sherrie Mustache, MD [facility], [facility]

## 2020-10-29 ENCOUNTER — Other Ambulatory Visit: Payer: Self-pay

## 2020-10-29 ENCOUNTER — Encounter: Payer: Self-pay | Admitting: Family Medicine

## 2020-10-29 ENCOUNTER — Ambulatory Visit (INDEPENDENT_AMBULATORY_CARE_PROVIDER_SITE_OTHER): Payer: Self-pay | Admitting: Family Medicine

## 2020-10-29 VITALS — BP 121/96 | HR 72 | Temp 98.4°F | Resp 18

## 2020-10-29 DIAGNOSIS — K219 Gastro-esophageal reflux disease without esophagitis: Secondary | ICD-10-CM

## 2020-10-29 DIAGNOSIS — Z23 Encounter for immunization: Secondary | ICD-10-CM

## 2020-10-29 DIAGNOSIS — E1165 Type 2 diabetes mellitus with hyperglycemia: Secondary | ICD-10-CM

## 2020-10-29 LAB — POCT GLYCOSYLATED HEMOGLOBIN (HGB A1C)
Est. average glucose Bld gHb Est-mCnc: 206
Hemoglobin A1C: 8.8 % — AB (ref 4.0–5.6)

## 2020-10-29 MED ORDER — RYBELSUS 3 MG PO TABS: 2.0000 | ORAL_TABLET | Freq: Every day | ORAL | 0 refills | Status: DC

## 2020-10-29 NOTE — Progress Notes (Signed)
Established patient visit   Patient: Curtis [name]   DOB: Aug 17, 1955   65 y.o. Male  MRN: [id number] Visit Date: 10/29/2020  Today's healthcare provider: Mila Merry, MD   Chief Complaint  Patient presents with   Diabetes   Subjective    HPI  Diabetes Mellitus Type II, Follow-up  Lab Results  Component Value Date   HGBA1C 8.8 (A) 10/29/2020   HGBA1C 9.3 (A) 06/12/2020   HGBA1C 10.0 (A) 04/23/2020   Wt Readings from Last 3 Encounters:  09/10/20 169 lb 8.5 oz (76.9 kg)  03/19/20 169 lb 9.6 oz (76.9 kg)  10/25/19 161 lb (73 kg)   Last seen for diabetes 4 months ago.  Management since then includes given more samples of Rybelsus. Referral placed for medication assistance.  He reports good compliance with treatment. He is not having side effects.  Symptoms: No fatigue No foot ulcerations  No appetite changes No nausea  No paresthesia of the feet  No polydipsia  No polyuria No visual disturbances   No vomiting     Home blood sugar records: fasting range: 150-200  Episodes of hypoglycemia? No    Current insulin regiment: none Most Recent Eye Exam: 10/02/2020 Current exercise: none Current diet habits: in general, an "unhealthy" diet  Pertinent Labs: Lab Results  Component Value Date   CHOL 149 03/26/2019   HDL 21 (L) 03/26/2019   LDLCALC 69 03/26/2019   TRIG 296 (H) 03/26/2019   CHOLHDL 7.1 03/26/2019   Lab Results  Component Value Date   NA 129 (L) 09/10/2020   K 4.9 09/10/2020   CREATININE 1.36 (H) 09/10/2020   GFRNONAA 58 (L) 09/10/2020   GLUCOSE 190 (H) 09/10/2020     ---------------------------------------------------------------------------------------------------     Medications: Outpatient Medications Prior to Visit  Medication Sig   famotidine (ZANTAC 360 MAX ST) 20 MG tablet Take 20 mg by mouth every evening.   acetaminophen (TYLENOL) 500 MG tablet Take 2 tablets (1,000 mg total) by mouth every 6 (six) hours.   aspirin 81 MG  chewable tablet Chew 1 tablet (81 mg total) by mouth daily.   atorvastatin (LIPITOR) 40 MG tablet TAKE 1 TABLET (40 MG TOTAL) BY MOUTH DAILY AT 6 PM.   clopidogrel (PLAVIX) 75 MG tablet Take 1 tablet (75 mg total) by mouth daily.   dapagliflozin propanediol (FARXIGA) 10 MG TABS tablet Take 1 tablet (10 mg total) by mouth daily before breakfast.   digoxin (LANOXIN) 0.125 MG tablet Take 1 tablet (0.125 mg total) by mouth daily.   furosemide (LASIX) 20 MG tablet Take 1 tablet (20 mg total) by mouth daily.   glipiZIDE (GLUCOTROL) 10 MG tablet TAKE 1 TABLET(10 MG) BY MOUTH TWICE DAILY BEFORE A MEAL   HYDROcodone-acetaminophen (NORCO/VICODIN) 5-325 MG tablet Take 1 tablet by mouth every 6 (six) hours as needed for moderate pain.   Lancet Devices MISC Use to check blood sugar twice a day   metFORMIN (GLUCOPHAGE-XR) 500 MG 24 hr tablet Take 1 tablet (500 mg total) by mouth daily.   metoprolol succinate (TOPROL-XL) 50 MG 24 hr tablet Take 1 tablet (50 mg total) by mouth daily.   ondansetron (ZOFRAN ODT) 4 MG disintegrating tablet Take 1 tablet (4 mg total) by mouth every 8 (eight) hours as needed for nausea or vomiting.   sacubitril-valsartan (ENTRESTO) 97-103 MG TAKE 1 TABLET BY MOUTH 2 (TWO) TIMES DAILY.   spironolactone (ALDACTONE) 25 MG tablet Take 1 tablet (25 mg total) by mouth daily.   [  DISCONTINUED] Semaglutide (RYBELSUS) 3 MG TABS Take 2 tablets by mouth daily.   No facility-administered medications prior to visit.    Review of Systems  Constitutional:  Negative for appetite change, chills and fever.  Respiratory:  Negative for chest tightness, shortness of breath and wheezing.   Cardiovascular:  Negative for chest pain and palpitations.  Gastrointestinal:  Negative for abdominal pain, nausea and vomiting.      Objective    BP (!) 121/96 (BP Location: Right Arm)   Pulse 72   Temp 98.4 F (36.9 C) (Oral)   Resp 18   SpO2 100% Comment: room air {Show previous vital signs  (optional):23777}  Physical Exam  General appearance:  Well developed, well nourished male, cooperative and in no acute distress Head: Normocephalic, without obvious abnormality, atraumatic Respiratory: Respirations even and unlabored, normal respiratory rate Extremities: All extremities are intact.  Skin: Skin color, texture, turgor normal. No rashes seen  Psych: Appropriate mood and affect. Neurologic: Mental status: Alert, oriented to person, place, and time, thought content appropriate.   Results for orders placed or performed in visit on 10/29/20  POCT HgB A1C  Result Value Ref Range   Hemoglobin A1C 8.8 (A) 4.0 - 5.6 %   Est. average glucose Bld gHb Est-mCnc 206     Assessment & Plan     1. Uncontrolled type 2 diabetes mellitus with hyperglycemia (HCC) Improving on samples of Rybelsus. Given more samples Semaglutide (RYBELSUS) 3 MG TABS; Take 2 tablets by mouth daily.  Dispense: 60 tablet; Refill: 0  Will apply for patient assistance to get on 7mg  per day.  - AMB Referral to [facility] Coordinaton  2. Need for influenza vaccination  - Flu Vaccine QUAD High Dose IM (Fluad)  3. GERD He as had more frequent heart burn since being on Rybelsus. This is fairly well controlled with prn famotidine (ZANTAC 360 MAX ST) 20 MG tablet; Take 20 mg by mouth every evening.   Future Appointments  Date Time Provider Department Center  12/05/2020  1:00 PM [facility] ECHO OP 1 [facility]  12/05/2020  2:00 PM Bensimhon, 12/07/2020, MD [facility] None  02/01/2021  1:40 PM 02/03/2021 Sherrie Mustache, MD [facility]        The entirety of the information documented in the History of Present Illness, Review of Systems and Physical Exam were personally obtained by me. Portions of this information were initially documented by the CMA and reviewed by me for thoroughness and accuracy.     Demetrios Isaacs, MD  [facility] [phone] (phone) [phone] (fax)  [facility]

## 2020-10-30 ENCOUNTER — Other Ambulatory Visit (HOSPITAL_COMMUNITY): Payer: Self-pay

## 2020-10-30 ENCOUNTER — Other Ambulatory Visit (HOSPITAL_COMMUNITY): Payer: Self-pay | Admitting: Internal Medicine

## 2020-10-30 DIAGNOSIS — I5020 Unspecified systolic (congestive) heart failure: Secondary | ICD-10-CM

## 2020-10-30 MED ORDER — FUROSEMIDE 20 MG PO TABS
20.0000 mg | ORAL_TABLET | Freq: Every day | ORAL | 3 refills | Status: DC
  Filled 2020-10-30: qty 30, 30d supply, fill #0
  Filled 2020-11-29: qty 30, 30d supply, fill #1
  Filled 2020-12-28: qty 30, 30d supply, fill #2
  Filled 2021-01-30: qty 30, 30d supply, fill #3

## 2020-10-31 ENCOUNTER — Telehealth: Payer: Self-pay | Admitting: *Deleted

## 2020-10-31 NOTE — Chronic Care Management (AMB) (Signed)
  Care Management   Note  10/31/2020 Name: Curtis [name] MRN: [id number] DOB: [birth year]  Curtis [name] is a 65 y.o. year old male who is a primary care patient of Sherrie Mustache, Demetrios Isaacs, MD. I reached out to Cherre Robins by phone today in response to a referral sent by Curtis [name]'s primary care provider.   Curtis [name] was given information about care management services today including:  Care management services include personalized support from designated clinical staff supervised by his physician, including individualized plan of care and coordination with other care providers 24/7 contact phone numbers for assistance for urgent and routine care needs. The patient may stop care management services at any time by phone call to the office staff.  Patient agreed to services and verbal consent obtained.   Follow up plan: Telephone appointment with care management team member scheduled for: 11/09/2020  Burman Nieves, CCMA Care Guide, Embedded Care Coordination [facility]  Care Management  Direct Dial: [phone]

## 2020-11-09 ENCOUNTER — Ambulatory Visit: Payer: Self-pay

## 2020-11-09 NOTE — Progress Notes (Signed)
Chronic Care Management Pharmacy Note  11/09/2020 Name:  Curtis [name] MRN:  [id number] DOB:  03-29-1955  Subjective: Curtis [name] is an 65 y.o. year old male who is a primary patient of Fisher, Kirstie Peri, MD.  The CCM team was consulted for assistance with disease management and care coordination needs.    Engaged with patient by telephone for follow up visit in response to provider referral for pharmacy case management and/or care coordination services.   Consent to Services:  Patient does qualify for CCM services. CPP Consulted for assistance with medication costs.   Patient Care Team: Birdie Sons, MD as PCP - General (Family Medicine) Wellington Hampshire, MD as PCP - Cardiology (Cardiology) Estill Cotta, MD (Ophthalmology) Isaias Sakai, MD as Referring Physician (Ophthalmology) Germaine Pomfret, [facility] (Pharmacist)  Objective:  Lab Results  Component Value Date   CREATININE 1.36 (H) 09/10/2020   BUN 49 (H) 09/10/2020   GFRNONAA 58 (L) 09/10/2020   GFRAA >60 07/18/2019   NA 129 (L) 09/10/2020   K 4.9 09/10/2020   CALCIUM 8.9 09/10/2020   CO2 20 (L) 09/10/2020   GLUCOSE 190 (H) 09/10/2020    Lab Results  Component Value Date/Time   HGBA1C 8.8 (A) 10/29/2020 03:38 PM   HGBA1C 9.3 (A) 06/12/2020 03:21 PM   HGBA1C 7.7 (H) 03/25/2019 11:44 PM   HGBA1C 7.0 (H) 12/27/2018 02:44 PM   HGBA1C >14.0 03/19/2018 11:33 AM   MICROALBUR 50 10/25/2019 11:26 AM   MICROALBUR 100 08/27/2018 02:38 PM    Last diabetic Eye exam:  Lab Results  Component Value Date/Time   HMDIABEYEEXA Retinopathy (A) 10/02/2020 12:00 AM    Last diabetic Foot exam: No results found for: HMDIABFOOTEX   Lab Results  Component Value Date   CHOL 149 03/26/2019   HDL 21 (L) 03/26/2019   LDLCALC 69 03/26/2019   TRIG 296 (H) 03/26/2019   CHOLHDL 7.1 03/26/2019    Hepatic Function Latest Ref Rng & Units 09/10/2020 03/31/2019 03/25/2019  Total Protein 6.5 - 8.1 g/dL 8.0 7.5 8.3(H)  Albumin 3.5  - 5.0 g/dL 3.0(L) 3.0(L) 3.6  AST 15 - 41 U/L 12(L) 20 18  ALT 0 - 44 U/L $Remo'18 17 13  'skSKL$ Alk Phosphatase 38 - 126 U/L 76 69 78  Total Bilirubin 0.3 - 1.2 mg/dL 0.7 0.8 0.9  Bilirubin, Direct 0.0 - 0.2 mg/dL <0.1 - -    Lab Results  Component Value Date/Time   TSH 5.230 (H) 09/06/2018 09:15 AM    CBC Latest Ref Rng & Units 09/10/2020 03/19/2020 07/18/2019  WBC 4.0 - 10.5 K/uL 12.6(H) 7.5 8.7  Hemoglobin 13.0 - 17.0 g/dL 10.8(L) 13.5 11.8(L)  Hematocrit 39.0 - 52.0 % 32.8(L) 41.2 37.4(L)  Platelets 150 - 400 K/uL 332 184 234    No results found for: VD25OH  Clinical ASCVD: Yes  The ASCVD Risk score (Arnett DK, et al., 2019) failed to calculate for the following reasons:   The patient has a prior MI or stroke diagnosis    Depression screen [facility] 2/9 10/29/2020 10/25/2019 05/24/2019  Decreased Interest 0 0 0  Down, Depressed, Hopeless 0 0 0  PHQ - 2 Score 0 0 0  Altered sleeping - 0 -  Tired, decreased energy - 0 -  Change in appetite - 0 -  Feeling bad or failure about yourself  - 0 -  Trouble concentrating - 0 -  Moving slowly or fidgety/restless - 0 -  Suicidal thoughts - 0 -  PHQ-9 Score - 0 -  Difficult doing work/chores - Not difficult at all -    Social History   Tobacco Use  Smoking Status Former   Packs/day: 0.75   Types: Cigarettes   Quit date: 04/26/2018   Years since quitting: 2.5  Smokeless Tobacco Never  Tobacco Comments   2-3 ppd since 65yo. cut back to under a ppd since around 2010   BP Readings from Last 3 Encounters:  10/29/20 (!) 121/96  09/11/20 111/68  06/12/20 116/63   Pulse Readings from Last 3 Encounters:  10/29/20 72  09/11/20 68  06/12/20 80   Wt Readings from Last 3 Encounters:  09/10/20 169 lb 8.5 oz (76.9 kg)  03/19/20 169 lb 9.6 oz (76.9 kg)  10/25/19 161 lb (73 kg)   BMI Readings from Last 3 Encounters:  09/10/20 25.78 kg/m  03/19/20 25.79 kg/m  10/25/19 24.48 kg/m    Assessment/Interventions: Review of patient past medical  history, allergies, medications, health status, including review of consultants reports, laboratory and other test data, was performed as part of comprehensive evaluation and provision of chronic care management services.   SDOH:  (Social Determinants of Health) assessments and interventions performed: Yes  SDOH Screenings   Alcohol Screen: Low Risk    Last Alcohol Screening Score (AUDIT): 0  Depression (PHQ2-9): Low Risk    PHQ-2 Score: 0  Financial Resource Strain: Not on file  Food Insecurity: Not on file  Housing: Not on file  Physical Activity: Not on file  Social Connections: Not on file  Stress: Not on file  Tobacco Use: Medium Risk   Smoking Tobacco Use: Former   Smokeless Tobacco Use: Never   Passive Exposure: Not on file  Transportation Needs: Not on file    CCM Care Plan  Allergies  Allergen Reactions   Morphine And Related     UNSPECIFIED REACTION    Codeine Nausea And Vomiting    Medications Reviewed Today     Reviewed by Fonda Kinder, CMA (Certified Medical Assistant) on 06/12/20 at 1358  Med List Status: <None>   Medication Order Taking? Sig Documenting Provider Last Dose Status Informant  acetaminophen (TYLENOL) 500 MG tablet [id number] Yes Take 2 tablets (1,000 mg total) by mouth every 6 (six) hours. Sharlene Dory, [place] Taking Active   aspirin 81 MG chewable tablet [id number] Yes Chew 1 tablet (81 mg total) by mouth daily. Robbie Lis M, PA-C Taking Active   atorvastatin (LIPITOR) 40 MG tablet [id number] Yes TAKE 1 TABLET (40 MG TOTAL) BY MOUTH DAILY AT 6 PM. Bensimhon, Bevelyn Buckles, MD Taking Active   clopidogrel (PLAVIX) 75 MG tablet [id number] Yes TAKE 1 TABLET (75 MG TOTAL) BY MOUTH DAILY. Bensimhon, Bevelyn Buckles, MD Taking Active   dapagliflozin propanediol (FARXIGA) 10 MG TABS tablet [id number] Yes TAKE 1 TABLET (10 MG TOTAL) BY MOUTH DAILY BEFORE BREAKFAST. Bensimhon, Bevelyn Buckles, MD Taking Active   digoxin (LANOXIN) 0.125 MG tablet [id number] Yes TAKE  1 TABLET (0.125 MG TOTAL) BY MOUTH DAILY. Bensimhon, Bevelyn Buckles, MD Taking Active   furosemide (LASIX) 20 MG tablet [id number] Yes TAKE 1 TABLET (20 MG TOTAL) BY MOUTH DAILY. Bensimhon, Bevelyn Buckles, MD Taking Active   glipiZIDE (GLUCOTROL) 10 MG tablet [id number] Yes TAKE 1 TABLET BY MOUTH DAILY BEFORE BREAKFAST Malva Limes, MD Taking Active   HYDROcodone-acetaminophen (NORCO/VICODIN) 5-325 MG tablet [id number] Yes Take 1 tablet by mouth every 6 (six) hours as needed for moderate pain. Malva Limes, MD Taking Active  Lancet Devices MISC [id number] Yes Use to check blood sugar twice a day Birdie Sons, MD Taking Active Self  metFORMIN (GLUCOPHAGE-XR) 500 MG 24 hr tablet [id number] Yes Take 1 tablet (500 mg total) by mouth daily. Birdie Sons, MD Taking Active   metoprolol succinate (TOPROL-XL) 50 MG 24 hr tablet [id number] Yes TAKE 1 TABLET (50 MG TOTAL) BY MOUTH DAILY. Bensimhon, Shaune Pascal, MD Taking Active   sacubitril-valsartan (ENTRESTO) 97-103 MG [id number] Yes TAKE 1 TABLET BY MOUTH 2 (TWO) TIMES DAILY. Bensimhon, Shaune Pascal, MD Taking Active   Semaglutide [facility]) 3 MG TABS [id number] Yes Take one tablet daily for 4 weeks, then increase to 2 tablets daily Birdie Sons, MD Taking Active   spironolactone (ALDACTONE) 25 MG tablet [id number] Yes TAKE 1 TABLET (25 MG TOTAL) BY MOUTH DAILY. Bensimhon, Shaune Pascal, MD Taking Active             Patient Active Problem List   Diagnosis Date Noted   S/P CABG x 5 04/01/2019   Ischemic cardiomyopathy 03/28/2019   3-vessel coronary artery disease 03/28/2019   HFrEF (heart failure with reduced ejection fraction) ([place]) 03/28/2019   Anemia 03/28/2019   Wheelchair bound 03/25/2019   Hyperlipidemia LDL goal <70 12/27/2018   Diabetes mellitus type 2 with retinopathy ([place]) 08/27/2018   Microalbuminuria 08/27/2018   Dyslipidemia 08/27/2018   Chronic pain of lower extremity 01/23/2015   History of kidney stones 01/23/2015   ED (erectile  dysfunction) 01/23/2015    Immunization History  Administered Date(s) Administered   Fluad Quad(high Dose 65+) 10/25/2019, 10/29/2020   PFIZER Comirnaty(Gray Top)Covid-19 Tri-Sucrose Vaccine 06/12/2020   PFIZER(Purple Top)SARS-COV-2 Vaccination 06/10/2019, 07/01/2019   Pneumococcal Polysaccharide-23 12/09/2019    Conditions to be addressed/monitored:  Diabetes  There are no care plans that you recently modified to display for this patient.    Medication Assistance: Application for Rybelsus  medication assistance program. in process.  Anticipated assistance start date TBD.  See plan of care for additional detail.  Patient to sign paperwork at clinic on 11/13/20.   Patient's preferred pharmacy is:  [facility] [address] [place] [address] Phone: [phone] Fax: [phone]  [facility] [place], [place] - [address] [place] [address] Phone: [phone] Fax: [phone]  [id number] [address], [place] [address] Phone: [phone] Fax: [phone]  Care Plan and Follow Up Patient Decision:  Patient requests no follow-up at this time.  Plan: Patient does qualify for CCM services. CPP Consulted for assistance with medication costs.   Junius Argyle, PharmD, Para [place], [place] [phone]

## 2020-11-14 ENCOUNTER — Telehealth (HOSPITAL_COMMUNITY): Payer: Self-pay | Admitting: Licensed Clinical Social Worker

## 2020-11-14 NOTE — Telephone Encounter (Signed)
CSW reaching out to patient because they are currently receiving medications through [employer] [facility].  CSW called to discuss if pt is eligible for any alternative coverage options.  Unable to reach- left VM requesting return  CSW will continue to follow to assist as needed.  Burna Sis, LCSW Clinical Social Worker [facility] Desk#: [phone] Cell#: [phone]

## 2020-11-16 ENCOUNTER — Other Ambulatory Visit (HOSPITAL_COMMUNITY): Payer: Self-pay

## 2020-11-19 ENCOUNTER — Telehealth (HOSPITAL_COMMUNITY): Payer: Self-pay | Admitting: Licensed Clinical Social Worker

## 2020-11-19 NOTE — Telephone Encounter (Signed)
CSW reaching out to patient because they are currently receiving medications through [employer] [facility].  CSW called to discuss if pt is eligible for any alternative coverage options.  Unable to reach- left VM requesting return call.  CSW will continue to follow to assist as needed.  Macio Kissoon H. Shelena Castelluccio, LCSW Clinical Social Worker [facility] Desk#: [phone] Cell#: [phone]  

## 2020-11-20 ENCOUNTER — Ambulatory Visit: Payer: Self-pay

## 2020-11-20 DIAGNOSIS — E1165 Type 2 diabetes mellitus with hyperglycemia: Secondary | ICD-10-CM

## 2020-11-20 NOTE — Progress Notes (Signed)
[employer] Patient Assistance form for Rybelsus 7 mg daily completed by patient and faxed for review on 11/20/20  Angelena Sole, PharmD, [name], CPP  Clinical Pharmacist [facility] [phone]

## 2020-11-29 ENCOUNTER — Other Ambulatory Visit (HOSPITAL_COMMUNITY): Payer: Self-pay

## 2020-12-05 ENCOUNTER — Ambulatory Visit (HOSPITAL_BASED_OUTPATIENT_CLINIC_OR_DEPARTMENT_OTHER)
Admission: RE | Admit: 2020-12-05 | Discharge: 2020-12-05 | Disposition: A | Discharge status: Home / Self Care | Payer: Self-pay | Source: Ambulatory Visit | Attending: Internal Medicine | Admitting: Internal Medicine

## 2020-12-05 ENCOUNTER — Ambulatory Visit (HOSPITAL_COMMUNITY)
Admission: RE | Admit: 2020-12-05 | Discharge: 2020-12-05 | Disposition: A | Discharge status: Home / Self Care | Payer: Self-pay | Source: Ambulatory Visit | Attending: Family Medicine | Admitting: Family Medicine

## 2020-12-05 ENCOUNTER — Encounter (HOSPITAL_COMMUNITY): Payer: Self-pay | Admitting: Internal Medicine

## 2020-12-05 ENCOUNTER — Other Ambulatory Visit: Payer: Self-pay

## 2020-12-05 VITALS — BP 138/80 | HR 81 | Wt 167.8 lb

## 2020-12-05 DIAGNOSIS — Z993 Dependence on wheelchair: Secondary | ICD-10-CM | POA: Insufficient documentation

## 2020-12-05 DIAGNOSIS — I252 Old myocardial infarction: Secondary | ICD-10-CM | POA: Insufficient documentation

## 2020-12-05 DIAGNOSIS — I2510 Atherosclerotic heart disease of native coronary artery without angina pectoris: Secondary | ICD-10-CM | POA: Insufficient documentation

## 2020-12-05 DIAGNOSIS — Z87891 Personal history of nicotine dependence: Secondary | ICD-10-CM | POA: Insufficient documentation

## 2020-12-05 DIAGNOSIS — E785 Hyperlipidemia, unspecified: Secondary | ICD-10-CM | POA: Insufficient documentation

## 2020-12-05 DIAGNOSIS — R600 Localized edema: Secondary | ICD-10-CM | POA: Insufficient documentation

## 2020-12-05 DIAGNOSIS — Z951 Presence of aortocoronary bypass graft: Secondary | ICD-10-CM

## 2020-12-05 DIAGNOSIS — I5020 Unspecified systolic (congestive) heart failure: Secondary | ICD-10-CM

## 2020-12-05 DIAGNOSIS — E119 Type 2 diabetes mellitus without complications: Secondary | ICD-10-CM | POA: Insufficient documentation

## 2020-12-05 DIAGNOSIS — Z7982 Long term (current) use of aspirin: Secondary | ICD-10-CM | POA: Insufficient documentation

## 2020-12-05 DIAGNOSIS — Z7901 Long term (current) use of anticoagulants: Secondary | ICD-10-CM | POA: Insufficient documentation

## 2020-12-05 DIAGNOSIS — I110 Hypertensive heart disease with heart failure: Secondary | ICD-10-CM | POA: Insufficient documentation

## 2020-12-05 DIAGNOSIS — I5022 Chronic systolic (congestive) heart failure: Secondary | ICD-10-CM | POA: Insufficient documentation

## 2020-12-05 DIAGNOSIS — E782 Mixed hyperlipidemia: Secondary | ICD-10-CM

## 2020-12-05 DIAGNOSIS — Z79899 Other long term (current) drug therapy: Secondary | ICD-10-CM | POA: Insufficient documentation

## 2020-12-05 LAB — ECHOCARDIOGRAM COMPLETE
Area-P 1/2: 2.84 cm2
Calc EF: 50.6 %
S' Lateral: 3.4 cm
Single Plane A2C EF: 50.1 %
Single Plane A4C EF: 49.1 %

## 2020-12-05 LAB — CBC
HCT: 36.1 % — ABNORMAL LOW (ref 39.0–52.0)
Hemoglobin: 11.6 g/dL — ABNORMAL LOW (ref 13.0–17.0)
MCH: 29.7 pg (ref 26.0–34.0)
MCHC: 32.1 g/dL (ref 30.0–36.0)
MCV: 92.6 fL (ref 80.0–100.0)
Platelets: 190 10*3/uL (ref 150–400)
RBC: 3.9 MIL/uL — ABNORMAL LOW (ref 4.22–5.81)
RDW: 14 % (ref 11.5–15.5)
WBC: 7.2 10*3/uL (ref 4.0–10.5)
nRBC: 0 % (ref 0.0–0.2)

## 2020-12-05 LAB — BASIC METABOLIC PANEL
Anion gap: 12 (ref 5–15)
BUN: 25 mg/dL — ABNORMAL HIGH (ref 8–23)
CO2: 23 mmol/L (ref 22–32)
Calcium: 9.5 mg/dL (ref 8.9–10.3)
Chloride: 99 mmol/L (ref 98–111)
Creatinine, Ser: 1.06 mg/dL (ref 0.61–1.24)
GFR, Estimated: >60 mL/min (ref >60)
Glucose, Bld: 248 mg/dL — ABNORMAL HIGH (ref 70–99)
Potassium: 4.3 mmol/L (ref 3.5–5.1)
Sodium: 134 mmol/L — ABNORMAL LOW (ref 135–145)
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
## 2020-12-05 NOTE — Progress Notes (Signed)
  Echocardiogram 2D Echocardiogram has been performed.  Leta Jungling M 12/05/2020, 1:55 PM

## 2020-12-05 NOTE — Patient Instructions (Signed)
Stop taking Plavix. Continue Asprin 81 mg Daily.  Please wear compression stockings.  Labs done today, your results will be available in MyChart, we will contact you for abnormal readings.  Your physician recommends that you schedule a follow-up appointment in: 1 year (November 2023), ** call office in September 2023 for appointment**  If you have any questions or concerns before your next appointment please send [place] a message through [place] or call our office at [phone].    TO LEAVE A MESSAGE FOR THE NURSE SELECT OPTION 2, PLEASE LEAVE A MESSAGE INCLUDING: YOUR NAME DATE OF BIRTH CALL BACK NUMBER REASON FOR CALL**this is important as we prioritize the call backs  YOU WILL RECEIVE A CALL BACK THE SAME DAY AS LONG AS YOU CALL BEFORE 4:00 PM  At the [facility], you and your health needs are our priority. As part of our continuing mission to provide you with exceptional heart care, we have created designated Provider Care Teams. These Care Teams include your primary Cardiologist (physician) and Advanced Practice Providers (APPs- Physician Assistants and Nurse Practitioners) who all work together to provide you with the care you need, when you need it.   You may see any of the following providers on your designated Care Team at your next follow up: Dr Arvilla Meres Dr Carron Curie, NP Robbie Lis, [place], [place] Karle Plumber, PharmD   Please be sure to bring in all your medications bottles to every appointment.

## 2020-12-06 LAB — LIPID PANEL
Cholesterol: 251 mg/dL — ABNORMAL HIGH (ref 0–200)
Triglycerides: 1763 mg/dL — ABNORMAL HIGH (ref <150)
VLDL: UNDETERMINED mg/dL (ref 0–40)

## 2020-12-06 LAB — LDL CHOLESTEROL, DIRECT: Direct LDL: 16.7 mg/dL (ref 0–99)

## 2020-12-10 NOTE — Addendum Note (Signed)
Encounter addended by: Modesta Messing, CMA on: 12/10/2020 1:37 PM  Actions taken: Order list changed, Diagnosis association updated

## 2020-12-17 ENCOUNTER — Other Ambulatory Visit (HOSPITAL_COMMUNITY): Payer: Self-pay

## 2020-12-19 ENCOUNTER — Telehealth: Payer: Self-pay

## 2020-12-19 NOTE — Telephone Encounter (Signed)
It is okay to leave samples if available? ° °Thanks,  ° °-Sudie Bandel  °

## 2020-12-19 NOTE — Telephone Encounter (Signed)
He can have two boxes if we have them.

## 2020-12-19 NOTE — Telephone Encounter (Signed)
Copied from CRM [id number]. Topic: General - Other >> Dec 19, 2020  9:52 AM Herby Abraham C wrote: Reason for CRM: pt's spouse called in for assistance. Pt is down to 1 pill left of Semaglutide (RYBELSUS) 3 MG TABS. Spouse says that pt has been receiving samples from the office. Pt would like to know if the office has more samples?   Please advise further.

## 2020-12-19 NOTE — Telephone Encounter (Signed)
Pt calling back to find out the status of the samples, please advise.

## 2020-12-21 NOTE — Telephone Encounter (Signed)
We only had one box left.  It has been left up front.  Mrs. Bacigalupi advised.   Thanks,   -Vernona Rieger

## 2020-12-25 ENCOUNTER — Other Ambulatory Visit: Payer: Self-pay

## 2020-12-25 DIAGNOSIS — E1165 Type 2 diabetes mellitus with hyperglycemia: Secondary | ICD-10-CM

## 2020-12-25 MED ORDER — RYBELSUS 3 MG PO TABS: 2.0000 | ORAL_TABLET | Freq: Every day | ORAL | 0 refills | Status: DC

## 2020-12-25 NOTE — Telephone Encounter (Signed)
Samples of Rybelsus placed up front for pick up. Patient advised.

## 2020-12-28 ENCOUNTER — Encounter: Payer: Self-pay | Admitting: Internal Medicine

## 2020-12-28 ENCOUNTER — Other Ambulatory Visit (HOSPITAL_COMMUNITY): Payer: Self-pay

## 2021-01-18 ENCOUNTER — Other Ambulatory Visit (HOSPITAL_COMMUNITY): Payer: Self-pay

## 2021-01-28 ENCOUNTER — Telehealth: Payer: Self-pay

## 2021-01-28 NOTE — Progress Notes (Signed)
° ° °  Chronic Care Management Pharmacy Assistant   Name: Curtis [name]  MRN: [id number] DOB: 07-20-1955  Reason for Encounter:Diabetes Disease State Call.   Recent office visits:  No recent office visit  Recent consult visits:  12/05/2020 Dr. Gala Romney MD (Cardiology) Stop taking Plavix, AMB Referral to [facility].  Hospital visits:  None in previous 6 months  Medications: Outpatient Encounter Medications as of 01/28/2021  Medication Sig   acetaminophen (TYLENOL) 500 MG tablet Take 2 tablets (1,000 mg total) by mouth every 6 (six) hours.   aspirin 81 MG chewable tablet Chew 1 tablet (81 mg total) by mouth daily.   atorvastatin (LIPITOR) 40 MG tablet TAKE 1 TABLET (40 MG TOTAL) BY MOUTH DAILY AT 6 PM.   dapagliflozin propanediol (FARXIGA) 10 MG TABS tablet Take 1 tablet (10 mg total) by mouth daily before breakfast.   digoxin (LANOXIN) 0.125 MG tablet Take 1 tablet (0.125 mg total) by mouth daily.   famotidine (PEPCID) 20 MG tablet Take 20 mg by mouth every evening.   furosemide (LASIX) 20 MG tablet Take 1 tablet (20 mg total) by mouth daily.   glipiZIDE (GLUCOTROL) 10 MG tablet TAKE 1 TABLET(10 MG) BY MOUTH TWICE DAILY BEFORE A MEAL   HYDROcodone-acetaminophen (NORCO/VICODIN) 5-325 MG tablet Take 1 tablet by mouth every 6 (six) hours as needed for moderate pain.   Lancet Devices MISC Use to check blood sugar twice a day   metFORMIN (GLUCOPHAGE-XR) 500 MG 24 hr tablet Take 1 tablet (500 mg total) by mouth daily.   metoprolol succinate (TOPROL-XL) 50 MG 24 hr tablet Take 1 tablet (50 mg total) by mouth daily.   sacubitril-valsartan (ENTRESTO) 97-103 MG TAKE 1 TABLET BY MOUTH 2 (TWO) TIMES DAILY.   Semaglutide (RYBELSUS) 3 MG TABS Take 2 tablets by mouth daily.   spironolactone (ALDACTONE) 25 MG tablet Take 1 tablet (25 mg total) by mouth daily.   No facility-administered encounter medications on file as of 01/28/2021.    Care Gaps: Foot  Exam Tetanus/Tdap Shingrix Colonoscopy Covid-19 vaccine Pneumonia Vaccine Star Rating Drugs: Atorvastatin 40 mg last filled 12/28/2020 30 day supply at [facility] Outpatient Farxiga 10 mg last filled 01/18/2021 30 day supply at [facility] Outpatient Rybelsus 3 mg samples given from PCP office on 08/03/2020. Metformin 500 mg last filled 09/30/2020 90 day supply at [facility] Glipizide 10 mg last filled 10/22/2020 90 day supply at [facility]. Entresto 97-103 mg last filled 04/07/2019 30 day supply at [facility] Transitions Medication Fill Gaps: None ID  Recent Relevant Labs: Lab Results  Component Value Date/Time   HGBA1C 8.8 (A) 10/29/2020 03:38 PM   HGBA1C 9.3 (A) 06/12/2020 03:21 PM   HGBA1C 7.7 (H) 03/25/2019 11:44 PM   HGBA1C 7.0 (H) 12/27/2018 02:44 PM   HGBA1C >14.0 03/19/2018 11:33 AM   MICROALBUR 50 10/25/2019 11:26 AM   MICROALBUR 100 08/27/2018 02:38 PM    Kidney Function Lab Results  Component Value Date/Time   CREATININE 1.06 12/05/2020 03:12 PM   CREATININE 1.36 (H) 09/10/2020 02:50 PM   GFRNONAA >60 12/05/2020 03:12 PM   GFRAA >60 07/18/2019 11:15 AM    Error, patient is not enrolled in CCM service.  Everlean Cherry Clinical Pharmacist Assistant [phone]

## 2021-01-29 ENCOUNTER — Telehealth: Payer: Self-pay | Admitting: Family Medicine

## 2021-01-29 NOTE — Telephone Encounter (Signed)
Spoke with patient regarding Rybelsus Patient assistance application

## 2021-01-29 NOTE — Telephone Encounter (Signed)
Pt needs Curtis [name] to give him a call concerning the assistance for Semaglutide (RYBELSUS) 3 MG TABS.  Wife says they need to talk to him.

## 2021-01-30 ENCOUNTER — Other Ambulatory Visit (HOSPITAL_COMMUNITY): Payer: Self-pay

## 2021-02-01 ENCOUNTER — Encounter: Payer: Self-pay | Admitting: Family Medicine

## 2021-02-01 ENCOUNTER — Ambulatory Visit (INDEPENDENT_AMBULATORY_CARE_PROVIDER_SITE_OTHER): Payer: Self-pay | Admitting: Family Medicine

## 2021-02-01 ENCOUNTER — Other Ambulatory Visit: Payer: Self-pay

## 2021-02-01 VITALS — BP 144/72 | HR 70 | Temp 98.4°F

## 2021-02-01 DIAGNOSIS — E1165 Type 2 diabetes mellitus with hyperglycemia: Secondary | ICD-10-CM

## 2021-02-01 LAB — POCT GLYCOSYLATED HEMOGLOBIN (HGB A1C): Hemoglobin A1C: 9.4 % — AB (ref 4.0–5.6)

## 2021-02-01 MED ORDER — FAMOTIDINE 40 MG PO TABS: 40.0000 mg | ORAL_TABLET | Freq: Every day | ORAL | 5 refills | Status: DC

## 2021-02-01 NOTE — Progress Notes (Signed)
Established patient visit   Patient: Curtis [name]   DOB: [birth year]   66 y.o. Male  MRN: [id number] Visit Date: 02/01/2021  Today's healthcare provider: Mila Merry, MD   No chief complaint on file.  Subjective    HPI   Diabetes Mellitus Type II, Follow-up  Lab Results  Component Value Date   HGBA1C 8.8 (A) 10/29/2020   HGBA1C 9.3 (A) 06/12/2020   HGBA1C 10.0 (A) 04/23/2020   Wt Readings from Last 3 Encounters:  12/05/20 167 lb 12.8 oz (76.1 kg)  09/10/20 169 lb 8.5 oz (76.9 kg)  03/19/20 169 lb 9.6 oz (76.9 kg)   Last seen for diabetes 3 months ago.   He reports excellent compliance with treatment. He is not having side effects.  Symptoms: No fatigue No foot ulcerations  No appetite changes No nausea  No paresthesia of the feet  No polydipsia  No polyuria No visual disturbances   No vomiting     Home blood sugar records:  are not being check  Episodes of hypoglycemia? No    Current insulin regiment: None Most Recent Eye Exam: 10/02/2020 Current exercise: none Current diet habits: in general, a "healthy" diet      ---------------------------------------------------------------------------------------------------   Medications: Outpatient Medications Prior to Visit  Medication Sig   acetaminophen (TYLENOL) 500 MG tablet Take 2 tablets (1,000 mg total) by mouth every 6 (six) hours.   aspirin 81 MG chewable tablet Chew 1 tablet (81 mg total) by mouth daily.   atorvastatin (LIPITOR) 40 MG tablet TAKE 1 TABLET (40 MG TOTAL) BY MOUTH DAILY AT 6 PM.   dapagliflozin propanediol (FARXIGA) 10 MG TABS tablet Take 1 tablet (10 mg total) by mouth daily before breakfast.   digoxin (LANOXIN) 0.125 MG tablet Take 1 tablet (0.125 mg total) by mouth daily.   famotidine (PEPCID) 20 MG tablet Take 20 mg by mouth every evening.   furosemide (LASIX) 20 MG tablet Take 1 tablet (20 mg total) by mouth daily.   glipiZIDE (GLUCOTROL) 10 MG tablet TAKE 1 TABLET(10 MG) BY  MOUTH TWICE DAILY BEFORE A MEAL   HYDROcodone-acetaminophen (NORCO/VICODIN) 5-325 MG tablet Take 1 tablet by mouth every 6 (six) hours as needed for moderate pain.   Lancet Devices MISC Use to check blood sugar twice a day   metFORMIN (GLUCOPHAGE-XR) 500 MG 24 hr tablet Take 1 tablet (500 mg total) by mouth daily.   metoprolol succinate (TOPROL-XL) 50 MG 24 hr tablet Take 1 tablet (50 mg total) by mouth daily.   sacubitril-valsartan (ENTRESTO) 97-103 MG TAKE 1 TABLET BY MOUTH 2 (TWO) TIMES DAILY.   Semaglutide (RYBELSUS) 3 MG TABS Take 2 tablets by mouth daily.   spironolactone (ALDACTONE) 25 MG tablet Take 1 tablet (25 mg total) by mouth daily.   No facility-administered medications prior to visit.    Review of Systems  Constitutional: Negative.   Respiratory: Negative.    Cardiovascular: Negative.   Gastrointestinal: Negative.   Neurological:  Negative for dizziness, light-headedness and headaches.      Objective    BP (!) 144/72 (BP Location: Right Arm, Patient Position: Sitting, Cuff Size: Large)    Pulse 70    Temp 98.4 F (36.9 C) (Oral)    SpO2 99%    Physical Exam   General appearance:  Well developed, well nourished male, cooperative and in no acute distress Head: Normocephalic, without obvious abnormality, atraumatic Respiratory: Respirations even and unlabored, normal respiratory rate Extremities: All extremities are intact.  Skin: Skin color, texture, turgor normal. No rashes seen  Psych: Appropriate mood and affect. Neurologic: Mental status: Alert, oriented to person, place, and time, thought content appropriate.   Results for orders placed or performed in visit on 02/01/21  POCT glycosylated hemoglobin (Hb A1C)  Result Value Ref Range   Hemoglobin A1C 9.4 (A) 4.0 - 5.6 %     Assessment & Plan     1. Uncontrolled type 2 diabetes mellitus with hyperglycemia (HCC) He's been taking 2 x 3mg  sample tables of Rybelsus daily but A1c is still high. Will check with  pharmacist to see if we can get patient assistance fo 14mg  tablets. He doing well with other medications.   GERD increase famotidine (PEPCID) 40 MG tablet; Take 1 tablet (40 mg total) by mouth daily.  Dispense: 30 tablet; Refill: 5        The entirety of the information documented in the History of Present Illness, Review of Systems and Physical Exam were personally obtained by me. Portions of this information were initially documented by the CMA and reviewed by me for thoroughness and accuracy.     , MD  [facility] [phone] (phone) [phone] (fax)  [facility]

## 2021-02-07 ENCOUNTER — Telehealth: Payer: Self-pay

## 2021-02-07 NOTE — Progress Notes (Signed)
I reach out to [employer] to get the status of patient application for Rybelsus per clinical pharmacist request.  Per [employer],  the  entire application will need to be refaxed due to the dates does note match. The application was more than 105-Days old. The Clinical pharmacist will have to white out the old dates and put the new date when he fax the application.Make sure all dates match along with checking the Re-enrollment box on the application.Notified clinical pharmacist.    Everlean Cherry Clinical Pharmacist Assistant [phone]

## 2021-02-14 ENCOUNTER — Ambulatory Visit: Payer: Self-pay | Admitting: *Deleted

## 2021-02-14 NOTE — Telephone Encounter (Signed)
°  Chief Complaint: weakness Symptoms: vomiting, blood in urine- 24 hours- Friday/Saturday - none since Frequency: 1 week Pertinent Negatives: Patient denies fever, bowel changes Disposition: [] ED /[] Urgent Care (no appt availability in office) / [x] Appointment(In office/virtual)/ []  [facility]/ [] Home Care/ [] Refused Recommended Disposition /[] [facility] [place] [facility]/ []  Follow-up with PCP Additional Notes: Patient refuses UC/ED- will not go back- advised no appointment within disposition- patient requested first available.   Reason for Disposition  [1] MODERATE weakness (i.e., interferes with work, school, normal activities) AND [2] cause unknown  (Exceptions: weakness with acute minor illness, or weakness from poor fluid intake)  Answer Assessment - Initial Assessment Questions 1. DESCRIPTION: "Describe how you are feeling."     Weakness- hard time transferring 2. SEVERITY: "How bad is it?"  "Can you stand and walk?"   - MILD - Feels weak or tired, but does not interfere with work, school or normal activities   - Scissors to stand and walk; weakness interferes with work, school, or normal activities   - SEVERE - Unable to stand or walk     moderate 3. ONSET:  "When did the weakness begin?"     1 week 4. CAUSE: "What do you think is causing the weakness?"     Patient has vomiting and blood in urine for 24 hours- Friday/Saturday 5. MEDICINES: "Have you recently started a new medicine or had a change in the amount of a medicine?"     no 6. OTHER SYMPTOMS: "Do you have any other symptoms?" (e.g., chest pain, fever, cough, SOB, vomiting, diarrhea, bleeding, other areas of pain)     Cold today 7. PREGNANCY: "Is there any chance you are pregnant?" "When was your last menstrual period?"     *No Answer*  Protocols used: Weakness (Generalized) and Fatigue-A-AH

## 2021-02-15 ENCOUNTER — Other Ambulatory Visit (HOSPITAL_COMMUNITY): Payer: Self-pay

## 2021-02-15 ENCOUNTER — Ambulatory Visit (INDEPENDENT_AMBULATORY_CARE_PROVIDER_SITE_OTHER): Payer: Self-pay | Admitting: Physician Assistant

## 2021-02-15 ENCOUNTER — Other Ambulatory Visit: Payer: Self-pay

## 2021-02-15 ENCOUNTER — Encounter: Payer: Self-pay | Admitting: Physician Assistant

## 2021-02-15 VITALS — BP 136/80 | HR 74 | Temp 99.2°F | Resp 14

## 2021-02-15 DIAGNOSIS — R319 Hematuria, unspecified: Secondary | ICD-10-CM

## 2021-02-15 DIAGNOSIS — N390 Urinary tract infection, site not specified: Secondary | ICD-10-CM

## 2021-02-15 DIAGNOSIS — E1165 Type 2 diabetes mellitus with hyperglycemia: Secondary | ICD-10-CM

## 2021-02-15 LAB — POCT URINALYSIS DIPSTICK
Bilirubin, UA: NEGATIVE
Glucose, UA: POSITIVE — AB
Ketones, UA: NEGATIVE
Nitrite, UA: NEGATIVE
Protein, UA: POSITIVE — AB
Spec Grav, UA: 1.01 (ref 1.010–1.025)
Urobilinogen, UA: 0.2 E.U./dL
pH, UA: 6 (ref 5.0–8.0)

## 2021-02-15 LAB — GLUCOSE, POCT (MANUAL RESULT ENTRY): POC Glucose: 324 mg/dl — AB (ref 70–99)

## 2021-02-15 MED ORDER — CIPROFLOXACIN HCL 250 MG PO TABS
250.0000 mg | ORAL_TABLET | Freq: Two times a day (BID) | ORAL | 0 refills | Status: DC
  Filled 2021-02-15: qty 10, 5d supply, fill #0

## 2021-02-15 MED ORDER — CIPROFLOXACIN HCL 250 MG PO TABS: 250.0000 mg | ORAL_TABLET | Freq: Two times a day (BID) | ORAL | 0 refills | Status: AC

## 2021-02-15 NOTE — Progress Notes (Signed)
Established patient visit   Patient: Curtis [name]   DOB: 1955/08/27   66 y.o. Male  MRN: [id number] Visit Date: 02/15/2021  Today's healthcare provider: Dani Gobble Maebelle Sulton, PA-C  Introduced myself to the patient as a Journalist, newspaper and provided education on APPs in clinical practice.    Chief Complaint  Patient presents with   Fatigue   Subjective    HPI  Fatigue and Weakness: Patient complains of fatigue and weakness for the past week. He reports having several episodes of vomiting 1 week ago. During that time he also noticed blood in his urine. Associated symptoms include excessive thirst, sweats, dizziness, lightheadedness, hypersomnia, polyuria and decreased appetite.    States he has had a few episodes of vomiting  Decreased appetite over the last few days Wife states he has been very weak and she was not sure if he would be able to come in today Wife states he has been sleeping more during the day than at night but reports this is normal for him They both report nausea but his wife states this is typical with his reflux. They state he has been feeling tired and sluggish over past few days Discussed potential for UTI and diabetes as potential scaffold for development of HHS/ DKA and the associated complications that may ensue Recommended that patient go to ED for quicker evaluation and more appropriate management- patient and wife were dismissive of this suggestion as they did not have desired experience the last time and patient is not insured.   Medications: Outpatient Medications Prior to Visit  Medication Sig   acetaminophen (TYLENOL) 500 MG tablet Take 2 tablets (1,000 mg total) by mouth every 6 (six) hours.   aspirin 81 MG chewable tablet Chew 1 tablet (81 mg total) by mouth daily.   atorvastatin (LIPITOR) 40 MG tablet TAKE 1 TABLET (40 MG TOTAL) BY MOUTH DAILY AT 6 PM.   dapagliflozin propanediol (FARXIGA) 10 MG TABS tablet Take 1 tablet (10 mg total) by mouth daily before  breakfast.   digoxin (LANOXIN) 0.125 MG tablet Take 1 tablet (0.125 mg total) by mouth daily.   famotidine (PEPCID) 40 MG tablet Take 1 tablet (40 mg total) by mouth daily.   furosemide (LASIX) 20 MG tablet Take 1 tablet (20 mg total) by mouth daily.   glipiZIDE (GLUCOTROL) 10 MG tablet TAKE 1 TABLET(10 MG) BY MOUTH TWICE DAILY BEFORE A MEAL   HYDROcodone-acetaminophen (NORCO/VICODIN) 5-325 MG tablet Take 1 tablet by mouth every 6 (six) hours as needed for moderate pain.   Lancet Devices MISC Use to check blood sugar twice a day   metFORMIN (GLUCOPHAGE-XR) 500 MG 24 hr tablet Take 1 tablet (500 mg total) by mouth daily.   metoprolol succinate (TOPROL-XL) 50 MG 24 hr tablet Take 1 tablet (50 mg total) by mouth daily.   sacubitril-valsartan (ENTRESTO) 97-103 MG TAKE 1 TABLET BY MOUTH 2 (TWO) TIMES DAILY.   Semaglutide (RYBELSUS) 3 MG TABS Take 2 tablets by mouth daily.   spironolactone (ALDACTONE) 25 MG tablet Take 1 tablet (25 mg total) by mouth daily.   No facility-administered medications prior to visit.    Review of Systems  Constitutional:  Positive for appetite change and diaphoresis. Negative for chills and fever.  Eyes:  Negative for visual disturbance.  Respiratory:  Negative for chest tightness, shortness of breath and wheezing.   Cardiovascular:  Negative for chest pain and palpitations.  Gastrointestinal:  Negative for abdominal pain, diarrhea, nausea and vomiting.  Endocrine: Positive for polydipsia and polyuria.  Genitourinary:  Positive for hematuria. Negative for dysuria.  Neurological:  Positive for dizziness, weakness and light-headedness. Negative for syncope and speech difficulty.  Psychiatric/Behavioral:  Negative for agitation and confusion.       Objective    BP 136/80    Pulse 74    Temp 99.2 F (37.3 C) (Oral)    Resp 14    SpO2 100% Comment: room air {Show previous vital signs (optional):23777}  Physical Exam HENT:     Head: Normocephalic and atraumatic.      Mouth/Throat:     Mouth: Mucous membranes are dry.  Eyes:     Conjunctiva/sclera: Conjunctivae normal.  Cardiovascular:     Rate and Rhythm: Normal rate and regular rhythm.     Pulses: Normal pulses.     Heart sounds: Normal heart sounds.  Pulmonary:     Effort: Pulmonary effort is normal.     Breath sounds: Normal breath sounds. No decreased breath sounds, wheezing, rhonchi or rales.  Abdominal:     General: Abdomen is flat. Bowel sounds are normal.  Musculoskeletal:     Right lower leg: 2+ Edema present.     Left lower leg: 2+ Edema present.  Skin:    General: Skin is cool and dry.     Comments: Skin turgor appears decreased  Neurological:     Mental Status: He is alert.  Psychiatric:        Mood and Affect: Mood normal.        Behavior: Behavior normal.      No results found for any visits on 02/15/21.  Assessment & Plan     Problem List Items Addressed This Visit       Endocrine   Uncontrolled type 2 diabetes mellitus with hyperglycemia (HCC) - Primary    Chronic, historic problem, currently uncontrolled with recent A1c of 9.4% and POCT glucose of 324 today Concern for medication compliance at this time  Patient encouraged to take medications as prescribed  Concern today for potential HHS - patient describing hematuria, nausea, vomiting, weakness, lightheadedness, and fatigue over the last week with UA indicative of UTI Patient was strongly encouraged to go to the ED for evaluation and management but declined at this time. Provided ED precautions as provider's office will be closed over weekend and cannot ensure rapid response time to labs in next 72 hours.  Ordered the following labs out of concern for HHS: CMP, CBC, serum osmo, and urine culture for susceptibility testing Discussed importance of staying hydrated, eating regularly, and taking antibiotic for UTI before concluding office visit      Relevant Orders   POCT Glucose (CBG) (Completed)   CBC  w/Diff/Platelet   Comprehensive Metabolic Panel (CMET)   Osmolality     Genitourinary   Complicated UTI (urinary tract infection)    Acute, new problem Patient has UA indicative of UTI and uncontrolled diabetes - concern for superimposed HHS caused by poor diabetic control and concomitant infection Provided patient with Ciprofloxacin 250 mg BID x 5 days per Uptodate guidelines for complicated UTI recommendations Strongly encouraged patient to go to ED for evaluation and management due to concerns for HHS. - patient and wife declined due to lack of insurance and previous experience with ED  Encouraged him to complete abx, stay well hydrated, and advised that any changes should prompt swift travel to ED.        Relevant Medications   ciprofloxacin (CIPRO) 250 MG tablet  Other Relevant Orders   CBC w/Diff/Platelet   Comprehensive Metabolic Panel (CMET)   Osmolality   Other Visit Diagnoses     Hematuria, unspecified type       Relevant Orders   POCT Urinalysis Dipstick (Completed)   Urine Culture   CBC w/Diff/Platelet   Comprehensive Metabolic Panel (CMET)   Osmolality        No follow-ups on file.   I, Juliana Boling E Leigh Kaeding, PA-C, have reviewed all documentation for this visit. The documentation on 02/15/21 for the exam, diagnosis, procedures, and orders are all accurate and complete.   Sharon Rubis, [place]ie [place] MPH [place], PA-C  [facility] [phone] (phone) [phone] (fax)  [place]

## 2021-02-15 NOTE — Assessment & Plan Note (Addendum)
Chronic, historic problem, currently uncontrolled with recent A1c of 9.4% and POCT glucose of 324 today Concern for medication compliance at this time  Patient encouraged to take medications as prescribed  Concern today for potential HHS - patient describing hematuria, nausea, vomiting, weakness, lightheadedness, and fatigue over the last week with UA indicative of UTI Patient was strongly encouraged to go to the ED for evaluation and management but declined at this time. Provided ED precautions as provider's office will be closed over weekend and cannot ensure rapid response time to labs in next 72 hours.  Ordered the following labs out of concern for HHS: CMP, CBC, serum osmo, and urine culture for susceptibility testing Discussed importance of staying hydrated, eating regularly, and taking antibiotic for UTI before concluding office visit

## 2021-02-15 NOTE — Assessment & Plan Note (Addendum)
Acute, new problem Patient has UA indicative of UTI and uncontrolled diabetes - concern for superimposed HHS caused by poor diabetic control and concomitant infection Provided patient with Ciprofloxacin 250 mg BID x 5 days per Uptodate guidelines for complicated UTI recommendations Strongly encouraged patient to go to ED for evaluation and management due to concerns for HHS. - patient and wife declined due to lack of insurance and previous experience with ED  Encouraged him to complete abx, stay well hydrated, and advised that any changes should prompt swift travel to ED.

## 2021-02-15 NOTE — Patient Instructions (Addendum)
Based on your symptoms today and the results of your urinalysis I strongly recommend that you go to the ED for evaluation and management   I will send in a script  for an antibiotic for your UTI and I recommend the following actions if you refuse ED disposition Stay hydrated Monitor your Blood glucose levels and take your medications as directed If there are any changes to your mental status: confusion, passing out, irritability, etc- go straight to the ED If you become more weak, are unable to keep down fluids and food, develop a fever, abdominal pain, nausea, vomiting, diarrhea, go to the ED  We will notify you of your lab results as soon as we can but it may not be until Monday - please do not wait for these to spur a trip to the emergency room if you feel worse.

## 2021-02-16 LAB — CBC WITH DIFFERENTIAL/PLATELET
Basophils Absolute: 0.1 10*3/uL (ref 0.0–0.2)
Basos: 1 %
EOS (ABSOLUTE): 0.1 10*3/uL (ref 0.0–0.4)
Eos: 1 %
Hematocrit: 33.7 % — ABNORMAL LOW (ref 37.5–51.0)
Hemoglobin: 11.3 g/dL — ABNORMAL LOW (ref 13.0–17.7)
Immature Grans (Abs): 0.2 10*3/uL — ABNORMAL HIGH (ref 0.0–0.1)
Immature Granulocytes: 2 %
Lymphocytes Absolute: 0.7 10*3/uL (ref 0.7–3.1)
Lymphs: 7 %
MCH: 29.3 pg (ref 26.6–33.0)
MCHC: 33.5 g/dL (ref 31.5–35.7)
MCV: 87 fL (ref 79–97)
Monocytes Absolute: 0.8 10*3/uL (ref 0.1–0.9)
Monocytes: 8 %
Neutrophils Absolute: 7.2 10*3/uL — ABNORMAL HIGH (ref 1.4–7.0)
Neutrophils: 81 %
Platelets: 236 10*3/uL (ref 150–450)
RBC: 3.86 x10E6/uL — ABNORMAL LOW (ref 4.14–5.80)
RDW: 13.7 % (ref 11.6–15.4)
WBC: 9 10*3/uL (ref 3.4–10.8)

## 2021-02-16 LAB — COMPREHENSIVE METABOLIC PANEL
ALT: 18 IU/L (ref 0–44)
AST: 17 IU/L (ref 0–40)
Albumin/Globulin Ratio: 0.9 — ABNORMAL LOW (ref 1.2–2.2)
Albumin: 3.5 g/dL — ABNORMAL LOW (ref 3.8–4.8)
Alkaline Phosphatase: 99 IU/L (ref 44–121)
BUN/Creatinine Ratio: 29 — ABNORMAL HIGH (ref 10–24)
BUN: 35 mg/dL — ABNORMAL HIGH (ref 8–27)
Bilirubin Total: 0.3 mg/dL (ref 0.0–1.2)
CO2: 21 mmol/L (ref 20–29)
Calcium: 8.2 mg/dL — ABNORMAL LOW (ref 8.6–10.2)
Chloride: 92 mmol/L — ABNORMAL LOW (ref 96–106)
Creatinine, Ser: 1.22 mg/dL (ref 0.76–1.27)
Globulin, Total: 3.7 g/dL (ref 1.5–4.5)
Glucose: 305 mg/dL — ABNORMAL HIGH (ref 70–99)
Potassium: 4.2 mmol/L (ref 3.5–5.2)
Sodium: 129 mmol/L — ABNORMAL LOW (ref 134–144)
Total Protein: 7.2 g/dL (ref 6.0–8.5)
eGFR: 65 mL/min/{1.73_m2} (ref >59)

## 2021-02-16 LAB — OSMOLALITY: Osmolality Meas: 292 mOsmol/kg (ref 280–301)

## 2021-02-18 ENCOUNTER — Ambulatory Visit: Payer: Self-pay

## 2021-02-18 DIAGNOSIS — N390 Urinary tract infection, site not specified: Secondary | ICD-10-CM

## 2021-02-18 NOTE — Telephone Encounter (Signed)
°  Chief Complaint: abx change request Symptoms: Cipro affects pt's bones and muscles per wife.  Frequency: NA Pertinent Negatives: NA Disposition: [] ED /[] Urgent Care (no appt availability in office) / [] Appointment(In office/virtual)/ []  [place] [facility]/ [] Home Care/ [] Refused Recommended Disposition /[] [place] [facility]/ [x]  Follow-up with PCP Additional Notes: Pt was seen on 02/15/21 by , PA. Pt was prescribed Cipro for UTI and wife states pt cannot take that medication it affects him too badly. She is requesting another abx to be sent into [facility] and to notify her if it is sent in or not.    Summary: medication side effects   Pts wife called and asked to speak with Dr. nurse about an RX that he can not take that he was prescribed by another provider in office / the Rx is ciprofloxacin (CIPRO) 250 MG tablet/ wife stated his daughter told them about the side effects that effects muscles and bones / please advise      Reason for Disposition  [1] Caller has URGENT medicine question about med that PCP or specialist prescribed AND [2] triager unable to answer question  Answer Assessment - Initial Assessment Questions 1. NAME of MEDICATION: "What medicine are you calling about?"     Cipro  2. QUESTION: "What is your question?" (e.g., double dose of medicine, side effect)     Wants a different abx called in 3. PRESCRIBING HCP: "Who prescribed it?" Reason: if prescribed by specialist, call should be referred to that group.     Erin, PA 4. SYMPTOMS: "Do you have any symptoms?"     Affects the bones and muscles in pt per wife 5. SEVERITY: If symptoms are present, ask "Are they mild, moderate or severe?"     Mild to moderate  Protocols used: Medication Question Call-A-AH

## 2021-02-19 ENCOUNTER — Telehealth: Payer: Self-pay | Admitting: Family Medicine

## 2021-02-19 ENCOUNTER — Other Ambulatory Visit (HOSPITAL_COMMUNITY): Payer: Self-pay

## 2021-02-19 MED ORDER — CEPHALEXIN 500 MG PO CAPS: 500.0000 mg | ORAL_CAPSULE | Freq: Four times a day (QID) | ORAL | 0 refills | Status: AC

## 2021-02-19 NOTE — Telephone Encounter (Signed)
Have sent prescription for cephalexin to [facility] to take instead

## 2021-02-19 NOTE — Addendum Note (Signed)
Addended by: Malva Limes on: 02/19/2021 11:39 AM   Modules accepted: Orders

## 2021-02-19 NOTE — Telephone Encounter (Signed)
Please advise 

## 2021-02-19 NOTE — Telephone Encounter (Signed)
Wife calling back because she had not heard anything back from Dr Sherrie Mustache concerning a different abx. Wife states pt cannot take the abx the dr ordered.  He did not even take the abx because they feel that is not appropriate for the pt to take with his health issues.  Please send in new abx. Pt and family read the side effects of Cipro and believe it is not for him.

## 2021-02-19 NOTE — Telephone Encounter (Signed)
Advised per office note- new Rx was sent to [facility] pharmacy.

## 2021-02-20 ENCOUNTER — Telehealth: Payer: Self-pay | Admitting: Family Medicine

## 2021-02-20 DIAGNOSIS — E1165 Type 2 diabetes mellitus with hyperglycemia: Secondary | ICD-10-CM

## 2021-02-20 NOTE — Telephone Encounter (Signed)
Please advise on sample request. We have 3 boxes of the 3mg   dose (30 tablets per box).

## 2021-02-20 NOTE — Telephone Encounter (Signed)
We are now out of samples. The last 3 boxes were given out by another provider.

## 2021-02-20 NOTE — Telephone Encounter (Signed)
He can have 2 boxes of the 3mg  tablets.

## 2021-02-20 NOTE — Telephone Encounter (Signed)
Patients wife called ind about getting more samples of Rybelsus. He only has 2 left.

## 2021-02-20 NOTE — Telephone Encounter (Signed)
Patients wife called back regarding speaking with Cristie Hem about assistance to get rybelsus

## 2021-02-21 ENCOUNTER — Telehealth: Payer: Self-pay

## 2021-02-21 DIAGNOSIS — N390 Urinary tract infection, site not specified: Secondary | ICD-10-CM

## 2021-02-21 LAB — URINE CULTURE

## 2021-02-21 MED ORDER — SULFAMETHOXAZOLE-TRIMETHOPRIM 800-160 MG PO TABS: 1.0000 | ORAL_TABLET | Freq: Two times a day (BID) | ORAL | 0 refills | Status: AC

## 2021-02-21 NOTE — Telephone Encounter (Signed)
-----   Message from Malva Limes, MD sent at 02/21/2021  9:58 AM EST ----- Infection is resistant to cephalexin. Need to change to septra DS one tablet twice a day for 7 days.

## 2021-02-21 NOTE — Telephone Encounter (Signed)
Patient and his wife advised. Prescription sent into pharmacy.

## 2021-02-22 MED ORDER — RYBELSUS 3 MG PO TABS: 2.0000 | ORAL_TABLET | Freq: Every day | ORAL | 0 refills | Status: DC

## 2021-02-22 NOTE — Telephone Encounter (Signed)
Samples now available. Patient advised. Samples placed up front for pick up.

## 2021-03-04 ENCOUNTER — Other Ambulatory Visit (HOSPITAL_COMMUNITY): Payer: Self-pay | Admitting: Internal Medicine

## 2021-03-04 ENCOUNTER — Other Ambulatory Visit (HOSPITAL_COMMUNITY): Payer: Self-pay

## 2021-03-04 DIAGNOSIS — I5020 Unspecified systolic (congestive) heart failure: Secondary | ICD-10-CM

## 2021-03-04 MED ORDER — FUROSEMIDE 20 MG PO TABS
20.0000 mg | ORAL_TABLET | Freq: Every day | ORAL | 3 refills | Status: DC
  Filled 2021-03-04: qty 30, 30d supply, fill #0
  Filled 2021-04-03: qty 30, 30d supply, fill #1
  Filled 2021-05-03: qty 30, 30d supply, fill #2
  Filled 2021-06-03: qty 30, 30d supply, fill #3

## 2021-03-08 ENCOUNTER — Other Ambulatory Visit (HOSPITAL_COMMUNITY): Payer: Self-pay

## 2021-03-08 MED ORDER — SACUBITRIL-VALSARTAN 97-103 MG PO TABS: 1.0000 | ORAL_TABLET | Freq: Two times a day (BID) | ORAL | 3 refills | Status: DC

## 2021-03-22 ENCOUNTER — Other Ambulatory Visit (HOSPITAL_COMMUNITY): Payer: Self-pay

## 2021-03-25 ENCOUNTER — Telehealth: Payer: Self-pay

## 2021-03-25 DIAGNOSIS — E1165 Type 2 diabetes mellitus with hyperglycemia: Secondary | ICD-10-CM

## 2021-03-25 NOTE — Telephone Encounter (Signed)
Copied from CRM [id number]. Topic: General - Other ?>> Mar 25, 2021  8:35 AM Eliseo Gum, Deedra Ehrich wrote: ?Reason for CRM:pt 's wife called in says needs more samples of rybelsus. ?

## 2021-03-26 NOTE — Telephone Encounter (Signed)
Pts wife called again about Rybelsus samples / stated she hasnt heard back / and Dr. Sherrie Mustache has been giving him samples / pt is out of medication /please advise  ?

## 2021-03-26 NOTE — Telephone Encounter (Signed)
Pts wife called back in stating they are still waiting for a call back, and the has ran out of them yesterday, please advise.  ?

## 2021-03-26 NOTE — Telephone Encounter (Signed)
Please advise if ok to give samples. We have samples of the 3mg  dose available.  ?

## 2021-03-27 MED ORDER — RYBELSUS 3 MG PO TABS: 2.0000 | ORAL_TABLET | Freq: Every day | ORAL | 0 refills | Status: DC

## 2021-03-27 NOTE — Telephone Encounter (Signed)
Patient's spouse advised. Samples placed up front.  ? ?Curtis [name], please advise on the medication assistance question. Do you have any updates for patient?   ?

## 2021-03-27 NOTE — Telephone Encounter (Signed)
He can have samples of the 3mg  to take 2 tablets every day. We were trying to get patient assistance for him to get 14mg  tablets. Can you check with to see if there has been any luck with that? ?

## 2021-03-27 NOTE — Telephone Encounter (Signed)
Patient calling back requesting update on sample request. ?

## 2021-04-01 LAB — HM DIABETES EYE EXAM

## 2021-04-03 ENCOUNTER — Other Ambulatory Visit (HOSPITAL_COMMUNITY): Payer: Self-pay

## 2021-04-03 ENCOUNTER — Telehealth: Payer: Self-pay

## 2021-04-03 MED ORDER — RYBELSUS 14 MG PO TABS: 14.0000 mg | ORAL_TABLET | Freq: Every day | ORAL | 0 refills | Status: DC

## 2021-04-03 NOTE — Progress Notes (Signed)
? ? ?Chronic Care Management ?Pharmacy Assistant  ? ?Name: Curtis [name]  MRN: [id number] DOB: Jun 29, 1955 ? ?Patient Assistance Application Follow-up for Rybelsus ? ?I am contacting [employer] on behalf of the patient regarding his patient assistance application for Rybelsus through [employer]. ? ?I contacted [employer] and the representative advised that the patient was approved for Rybelsus 3 mg until 03/27/2022. She did confirm that the processing for shipment for his medication has started but it can take up to 6 weeks to receive his first shipment. I did request a transfer to the [place] Department in order to request a Voucher for the patient's medication. ? ?I was transferred to the [place] and spoke with the representative who provided me with a 30-Day Voucher for the patient to be called into his local Pharmacy. ? ?Rybelsus? 14 mg tablets 30 ct bottle  ?The BIN Number is: [id number] ?The PCN Number is: [[id number]] ?The Group Number is: [id number] ?The ID Number is: [id number] ?EXPIRE DATE : 04/19/2021 ? ?Per CPP he stated that it should have been for 14 mg not 7 mg. CPP requested that I reach back out to [employer] to see if a verbal can be given for the 14 mg as the patient does not have enough medication at this time. ? ?I reached back out to [employer] and spoke with the representative who was able to update everything in her system as she confirmed that they did receive the prescription for the 14 mg, but only processed the 7 mg prescription. Once the representative was able to update the information to 14 mg daily she transferred me to the Voucher Department to get the correct voucher. ? ?CPP has been notified, and patient contacted with update. Patient advised that we can send 30-Day subscription to [facility] in [place].  ? ? ?Medications: ?Outpatient Encounter Medications as of 04/03/2021  ?Medication Sig  ? acetaminophen (TYLENOL) 500 MG tablet Take 2 tablets (1,000 mg total) by mouth  every 6 (six) hours.  ? aspirin 81 MG chewable tablet Chew 1 tablet (81 mg total) by mouth daily.  ? atorvastatin (LIPITOR) 40 MG tablet TAKE 1 TABLET (40 MG TOTAL) BY MOUTH DAILY AT 6 PM.  ? dapagliflozin propanediol (FARXIGA) 10 MG TABS tablet Take 1 tablet (10 mg total) by mouth daily before breakfast.  ? digoxin (LANOXIN) 0.125 MG tablet Take 1 tablet (0.125 mg total) by mouth daily.  ? famotidine (PEPCID) 40 MG tablet Take 1 tablet (40 mg total) by mouth daily.  ? furosemide (LASIX) 20 MG tablet Take 1 tablet (20 mg total) by mouth daily.  ? glipiZIDE (GLUCOTROL) 10 MG tablet TAKE 1 TABLET(10 MG) BY MOUTH TWICE DAILY BEFORE A MEAL  ? HYDROcodone-acetaminophen (NORCO/VICODIN) 5-325 MG tablet Take 1 tablet by mouth every 6 (six) hours as needed for moderate pain.  ? Lancet Devices MISC Use to check blood sugar twice a day  ? metFORMIN (GLUCOPHAGE-XR) 500 MG 24 hr tablet Take 1 tablet (500 mg total) by mouth daily.  ? metoprolol succinate (TOPROL-XL) 50 MG 24 hr tablet Take 1 tablet (50 mg total) by mouth daily.  ? sacubitril-valsartan (ENTRESTO) 97-103 MG TAKE 1 TABLET BY MOUTH 2 (TWO) TIMES DAILY.  ? Semaglutide (RYBELSUS) 3 MG TABS Take 2 tablets by mouth daily.  ? spironolactone (ALDACTONE) 25 MG tablet Take 1 tablet (25 mg total) by mouth daily.  ? ?No facility-administered encounter medications on file as of 04/03/2021.  ? ? ?Lynann Bologna, CPA/CMA ?Clinical Pharmacist  Assistant ?Phone: [phone]  ? ?

## 2021-04-03 NOTE — Telephone Encounter (Signed)
Patient has been approved for Rybelsus 14 mg daily, and patient should receive first shipment within the next 6 weeks. Patient was also provided with 30 day voucher for medication to take to his pharmacy. ? ?Angelena Sole, PharmD, BCACP, CPP  ?Clinical Pharmacist Practitioner  ?[facility] ?[phone] ? ?

## 2021-04-03 NOTE — Addendum Note (Signed)
Addended by: Daron Offer A on: 04/03/2021 03:36 PM ? ? Modules accepted: Orders ? ?

## 2021-04-03 NOTE — Telephone Encounter (Signed)
Thank you :)

## 2021-04-15 ENCOUNTER — Other Ambulatory Visit (HOSPITAL_COMMUNITY): Payer: Self-pay

## 2021-04-15 ENCOUNTER — Telehealth (HOSPITAL_COMMUNITY): Payer: Self-pay | Admitting: Pharmacy Technician

## 2021-04-15 NOTE — Telephone Encounter (Signed)
Advanced Heart Failure Patient Advocate Encounter ° °Received a notice from [employer] that it is time to renew Entresto assistance. Let voicemail for patient to call back and start the re-enrollment process.  ° °Dru Primeau F Guenevere Roorda, CPhT ° ° °

## 2021-04-22 ENCOUNTER — Other Ambulatory Visit (HOSPITAL_COMMUNITY): Payer: Self-pay

## 2021-04-23 ENCOUNTER — Ambulatory Visit: Payer: Self-pay | Admitting: *Deleted

## 2021-04-23 ENCOUNTER — Other Ambulatory Visit: Payer: Self-pay | Admitting: Family Medicine

## 2021-04-23 DIAGNOSIS — G8929 Other chronic pain: Secondary | ICD-10-CM

## 2021-04-23 NOTE — Telephone Encounter (Signed)
Medication Refill - Medication: HYDROcodone-acetaminophen (NORCO/VICODIN) 5-325 MG tablet  ? ?Has the patient contacted their pharmacy? No. They call this in  ?(Agent: If no, request that the patient contact the pharmacy for the refill. If patient does not wish to contact the pharmacy document the reason why and proceed with request.) ?(Agent: If yes, when and what did the pharmacy advise?) ? ?Preferred Pharmacy (with phone number or street name): [id number] [place], [place] - [address] AT [facility] OF [address]  ?[address], [place] [address]  ?Phone:  [phone]  Fax:  [phone]  ?Has the patient been seen for an appointment in the last year OR does the patient have an upcoming appointment? Yes.   ? ?Agent: Please be advised that RX refills may take up to 3 business days. We ask that you follow-up with your pharmacy. ?

## 2021-04-23 NOTE — Telephone Encounter (Signed)
?  Chief Complaint: Wife, Curtis [name] needs clarification on the glipizide.   Is he suppose to take 1 or 2 tablets a day? ?Symptoms: No symptoms.   She's only been giving him one glipizide a day in the mornings since he was started on the metformin. ?Frequency: N/A ?Pertinent Negatives: Patient denies N/A ?Disposition: [] ED /[] Urgent Care (no appt availability in office) / [] Appointment(In office/virtual)/ []  [place] [facility]/ [] Home Care/ [] Refused Recommended Disposition /[] [place] [facility]/ [x]  Follow-up with PCP ?Additional Notes: Message sent to [facility] for Dr. .   [name], wife agreeable to someone calling her back.  ?

## 2021-04-23 NOTE — Telephone Encounter (Signed)
Reason for Disposition ? [1] Caller has URGENT medicine question about med that PCP or specialist prescribed AND [2] triager unable to answer question ?   Question about glipizide dose ? ?Answer Assessment - Initial Assessment Questions ?1. NAME of MEDICATION: "What medicine are you calling about?" ?    Glipizide 10 mg ?2. QUESTION: "What is your question?" (e.g., double dose of medicine, side effect) ?    Does he take this once or twice a day?   ? When Dr. Sherrie Mustache put him on metformin he told pt to take glipizide in morning and metformin at night.   "I've only been giving him 1 glipizide in the mornings".   "He takes the metformin at night".   That's what Dr. Sherrie Mustache instructed him to do.   (I asked her what was on the glipizide bottle and it is to take 1 glipizide tablet twice a day). ? ?Rybelsus dose was increased to 14 mg.   Wife is concerned about this upped dose.   He has been ok with it so far.  He has only been on the 14 mg a couple of days now.    The first morning it upset his stomach but today he was fine.  She will continue to monitor him. ? ?3. PRESCRIBING HCP: "Who prescribed it?" Reason: if prescribed by specialist, call should be referred to that group. ?    Dr. Sherrie Mustache ?4. SYMPTOMS: "Do you have any symptoms?" ?    The Rybelsus upset his stomach the first morning he took it but this morning he has been fine with the 14 mg dose. ?5. SEVERITY: If symptoms are present, ask "Are they mild, moderate or severe?" ?    Not asked ?6. PREGNANCY:  "Is there any chance that you are pregnant?" "When was your last menstrual period?" ?    N/A ? ?Protocols used: Medication Question Call-A-AH ? ?

## 2021-04-24 ENCOUNTER — Telehealth (HOSPITAL_COMMUNITY): Payer: Self-pay | Admitting: Pharmacy Technician

## 2021-04-24 NOTE — Telephone Encounter (Signed)
One tablet twice a day before meals. (One before breakfast and one before supper) ?

## 2021-04-24 NOTE — Telephone Encounter (Signed)
Requested medication (s) are due for refill today: yes ? ?Requested medication (s) are on the active medication list: yes   ? ?Last refill: 10/02/20  #30  0 refills ? ?Future visit scheduled yes  05/17/21 ? ?Notes to clinic:Not delegated, please review. ? ?Requested Prescriptions  ?Pending Prescriptions Disp Refills  ? HYDROcodone-acetaminophen (NORCO/VICODIN) 5-325 MG tablet 30 tablet 0  ?  Sig: Take 1 tablet by mouth every 6 (six) hours as needed for moderate pain.  ?  ? Not Delegated - Analgesics:  Opioid Agonist Combinations Failed - 04/23/2021  5:39 PM  ?  ?  Failed - This refill cannot be delegated  ?  ?  Failed - Urine Drug Screen completed in last 360 days  ?  ?  Passed - Valid encounter within last 3 months  ?  Recent Outpatient Visits   ? ?      ? 2 months ago Uncontrolled type 2 diabetes mellitus with hyperglycemia (HCC)  ? [employer] [facility], Erin E, PA-C  ? 2 months ago Uncontrolled type 2 diabetes mellitus with hyperglycemia (HCC)  ? [facility] Malva Limes, MD  ? 5 months ago Uncontrolled type 2 diabetes mellitus with hyperglycemia (HCC)  ? [facility] Malva Limes, MD  ? 10 months ago Uncontrolled type 2 diabetes mellitus with hyperglycemia (HCC)  ? [facility] Malva Limes, MD  ? 1 year ago Uncontrolled type 2 diabetes mellitus with hyperglycemia (HCC)  ? [facility] Sherrie Mustache, Demetrios Isaacs, MD  ? ?  ?  ?Future Appointments   ? ?        ? In 3 weeks Fisher, Demetrios Isaacs, MD [facility]  ? ?  ? ?  ?  ?  ? ? ? ? ?

## 2021-04-24 NOTE — Telephone Encounter (Addendum)
Advanced Heart Failure Patient Advocate Encounter ? ?[name] left a vm requesting assistance of Entresto. Called back, no way to leave a vm. ? ?Archer Asa, CPhT ? ?

## 2021-04-24 NOTE — Telephone Encounter (Signed)
Pt informed

## 2021-04-25 MED ORDER — HYDROCODONE-ACETAMINOPHEN 5-325 MG PO TABS: 1.0000 | ORAL_TABLET | Freq: Four times a day (QID) | ORAL | 0 refills | Status: DC | PRN

## 2021-04-25 NOTE — Telephone Encounter (Signed)
LOV 02-15-21 ?NOV 05-17-21 ?LR 10-02-20 #30 with 0 refills  ?

## 2021-05-01 ENCOUNTER — Telehealth: Payer: Self-pay | Admitting: Family Medicine

## 2021-05-01 NOTE — Telephone Encounter (Signed)
Hi Alex,  ? ?I received a request for Rybelsus for Mr Clabo, but I was thinking he was getting this through prescription assistance. Do you if he is getting it mailed to him or do I needs to go ahead and send refill to [facility].  ? ?Thanks.! ?

## 2021-05-02 NOTE — Telephone Encounter (Signed)
Requested medication (s) are due for refill today:   Yes ? ?Requested medication (s) are on the active medication list:   Yes ? ?Future visit scheduled:   Yes in 2 wks with Fisher ? ? ?Last ordered: 04/03/2021 #30, 0 refills ? ?Returned because no protocol assigned to this medication  ? ?Requested Prescriptions  ?Pending Prescriptions Disp Refills  ? RYBELSUS 14 MG TABS [Pharmacy Med Name: RYBELSUS 14MG  TABLETS] 30 tablet 0  ?  Sig: TAKE 1 TABLET(14 MG) BY MOUTH DAILY  ?  ? Off-Protocol Failed - 05/01/2021 11:25 AM  ?  ?  Failed - Medication not assigned to a protocol, review manually.  ?  ?  Passed - Valid encounter within last 12 months  ?  Recent Outpatient Visits   ? ?      ? 2 months ago Uncontrolled type 2 diabetes mellitus with hyperglycemia (HCC)  ? 07/01/2021, Erin E, PA-C  ? 3 months ago Uncontrolled type 2 diabetes mellitus with hyperglycemia (HCC)  ? [facility], MD  ? 6 months ago Uncontrolled type 2 diabetes mellitus with hyperglycemia (HCC)  ? [facility], MD  ? 10 months ago Uncontrolled type 2 diabetes mellitus with hyperglycemia (HCC)  ? [facility], MD  ? 1 year ago Uncontrolled type 2 diabetes mellitus with hyperglycemia (HCC)  ? [facility], Sherrie Mustache, MD  ? ?  ?  ?Future Appointments   ? ?        ? In 2 weeks Fisher, Demetrios Isaacs, MD [facility], [facility]  ? ?  ? ?  ?  ?  ? ?

## 2021-05-02 NOTE — Telephone Encounter (Signed)
Patient has been approved for Rybelsus 14 mg through Dec 2023, so no need to send in a refill for this medication.  ?

## 2021-05-03 ENCOUNTER — Other Ambulatory Visit (HOSPITAL_COMMUNITY): Payer: Self-pay

## 2021-05-03 NOTE — Telephone Encounter (Signed)
Advanced Heart Failure Patient Advocate Encounter ? ?[name] called back, stated that they received the [employer] renewal application. I inquired if her and the patient had the POI that is also required now. She did not. Spoke about POI requirement. 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 The patient gets workers comp income. I advised her to call [employer] to see if they will accept that information. If they will not, she would need to request an income attestation form to be mailed to them directly. [name] voiced understanding.  ? ?Will send application in once received with necessary signatures.  ? ?Archer Asa, CPhT ? ?

## 2021-05-10 ENCOUNTER — Other Ambulatory Visit (HOSPITAL_COMMUNITY): Payer: Self-pay

## 2021-05-10 MED ORDER — SACUBITRIL-VALSARTAN 97-103 MG PO TABS: 1.0000 | ORAL_TABLET | Freq: Two times a day (BID) | ORAL | 3 refills | Status: DC

## 2021-05-10 NOTE — Telephone Encounter (Signed)
Sent in application via fax.  Will follow up.  

## 2021-05-14 ENCOUNTER — Encounter: Payer: Self-pay | Admitting: Family Medicine

## 2021-05-17 ENCOUNTER — Encounter: Payer: Self-pay | Admitting: Family Medicine

## 2021-05-17 ENCOUNTER — Ambulatory Visit (INDEPENDENT_AMBULATORY_CARE_PROVIDER_SITE_OTHER): Payer: Self-pay | Admitting: Family Medicine

## 2021-05-17 VITALS — BP 145/64 | HR 70 | Temp 98.7°F | Resp 16 | Wt 161.9 lb

## 2021-05-17 DIAGNOSIS — E1165 Type 2 diabetes mellitus with hyperglycemia: Secondary | ICD-10-CM

## 2021-05-17 NOTE — Progress Notes (Signed)
? ?I,Sulibeya S Dimas,acting as a scribe for Mila Merry, MD.,have documented all relevant documentation on the behalf of Mila Merry, MD,as directed by  Mila Merry, MD while in the presence of Mila Merry, MD. ?  ? ? ?Established patient visit ? ? ?Patient: Curtis [name]   DOB: 1955/07/04   66 y.o. Male  MRN: [id number] ?Visit Date: 05/17/2021 ? ?Today's healthcare provider: Mila Merry, MD  ? ?Chief Complaint  ?Patient presents with  ? Diabetes  ? ?Subjective  ?  ?HPI  ?Diabetes Mellitus Type II, Follow-up ? ?Lab Results  ?Component Value Date  ? HGBA1C 9.4 (A) 02/01/2021  ? HGBA1C 8.8 (A) 10/29/2020  ? HGBA1C 9.3 (A) 06/12/2020  ? ?Wt Readings from Last 3 Encounters:  ?05/17/21 161 lb 14.4 oz (73.4 kg)  ?12/05/20 167 lb 12.8 oz (76.1 kg)  ?09/10/20 169 lb 8.5 oz (76.9 kg)  ? ?Last seen for diabetes 3 months ago.  ?Management since then includes encouraging patient to take medications as prescribed. ?He reports excellent compliance with treatment. ?He is not having side effects.  ?Symptoms: ?No fatigue No foot ulcerations  ?No appetite changes No nausea  ?No paresthesia of the feet  No polydipsia  ?No polyuria Yes visual disturbances   ?No vomiting   ? ? ?Home blood sugar records: fasting range: 200s ? ?Episodes of hypoglycemia? No  ?  ?Current insulin regiment: none ?Most Recent Eye Exam: 04/01/2021 ?Current exercise: no regular exercise ?Current diet habits: in general, a "healthy" diet   ?His wife states that he had only be taking glipizide once a day instead of twice a day as prescribed until she realized it is supposed to be twice a day about a week ago.  ?Has also just gotten on the increase dose of rybelsus 14mg  about 3 weeks ago and is tolerating relative well.  ? ?---------------------------------------------------------------------------------------------------  ? ?Medications: ?Outpatient Medications Prior to Visit  ?Medication Sig  ? acetaminophen (TYLENOL) 500 MG tablet Take 2 tablets  (1,000 mg total) by mouth every 6 (six) hours.  ? aspirin 81 MG chewable tablet Chew 1 tablet (81 mg total) by mouth daily.  ? atorvastatin (LIPITOR) 40 MG tablet TAKE 1 TABLET (40 MG TOTAL) BY MOUTH DAILY AT 6 PM.  ? dapagliflozin propanediol (FARXIGA) 10 MG TABS tablet Take 1 tablet (10 mg total) by mouth daily before breakfast.  ? digoxin (LANOXIN) 0.125 MG tablet Take 1 tablet (0.125 mg total) by mouth daily.  ? famotidine (PEPCID) 40 MG tablet Take 1 tablet (40 mg total) by mouth daily.  ? furosemide (LASIX) 20 MG tablet Take 1 tablet (20 mg total) by mouth daily.  ? glipiZIDE (GLUCOTROL) 10 MG tablet TAKE 1 TABLET(10 MG) BY MOUTH TWICE DAILY BEFORE A MEAL  ? HYDROcodone-acetaminophen (NORCO/VICODIN) 5-325 MG tablet Take 1 tablet by mouth every 6 (six) hours as needed for moderate pain.  ? Lancet Devices MISC Use to check blood sugar twice a day  ? metFORMIN (GLUCOPHAGE-XR) 500 MG 24 hr tablet Take 1 tablet (500 mg total) by mouth daily.  ? metoprolol succinate (TOPROL-XL) 50 MG 24 hr tablet Take 1 tablet (50 mg total) by mouth daily.  ? sacubitril-valsartan (ENTRESTO) 97-103 MG TAKE 1 TABLET BY MOUTH 2 (TWO) TIMES DAILY.  ? Semaglutide (RYBELSUS) 14 MG TABS Take 1 tablet (14 mg total) by mouth daily.  ? spironolactone (ALDACTONE) 25 MG tablet Take 1 tablet (25 mg total) by mouth daily.  ? ?No facility-administered medications prior to visit.  ? ? ?  Review of Systems  ?Constitutional:  Positive for fatigue. Negative for activity change and appetite change.  ?Respiratory:  Negative for chest tightness.   ?Cardiovascular:  Negative for chest pain and palpitations.  ? ?  ?  Objective  ?  ?BP (!) 145/64 (BP Location: Right Arm, Patient Position: Sitting, Cuff Size: Large)   Pulse 70   Temp 98.7 ?F (37.1 ?C) (Oral)   Resp 16   Wt 161 lb 14.4 oz (73.4 kg)   SpO2 100%   BMI 24.62 kg/m?  ?BP Readings from Last 3 Encounters:  ?05/17/21 (!) 145/64  ?02/15/21 136/80  ?02/01/21 (!) 144/72  ? ?Wt Readings from Last 3  Encounters:  ?05/17/21 161 lb 14.4 oz (73.4 kg)  ?12/05/20 167 lb 12.8 oz (76.1 kg)  ?09/10/20 169 lb 8.5 oz (76.9 kg)  ? ?  ? ?Physical Exam  ? ?General appearance: Well developed, well nourished male, cooperative and in no acute distress ?Head: Normocephalic, without obvious abnormality, atraumatic ?Respiratory: Respirations even and unlabored, normal respiratory rate ?Extremities: All extremities are intact.  ?Skin: Skin color, texture, turgor normal. No rashes seen  ?Psych: Appropriate mood and affect. ?Neurologic: Mental status: Alert, oriented to person, place, and time, thought content appropriate.  ? ?FSBS=298 ? Assessment & Plan  ?  ? ?1. Uncontrolled type 2 diabetes mellitus with hyperglycemia (HCC) ?Just went back up to BID glipizide about a week ago, and increased dose of Rybelsus about 3 weeks ago. Will return in about 2 months to check A`c. Continue current medications.   ?   ? ?The entirety of the information documented in the History of Present Illness, Review of Systems and Physical Exam were personally obtained by me. Portions of this information were initially documented by the CMA and reviewed by me for thoroughness and accuracy.   ? ? ?Mila Merry, MD  ?[facility] ?[phone] (phone) ?[phone] (fax) ? ?[facility]  ?

## 2021-05-21 ENCOUNTER — Other Ambulatory Visit (HOSPITAL_COMMUNITY): Payer: Self-pay

## 2021-05-27 NOTE — Telephone Encounter (Signed)
Advanced Heart Failure Patient Advocate Encounter  ? ?Patient was approved to receive Entresto from [employer] ? ?Effective dates: 05/27/21 through 05/28/22 ? ?Document scanned to chart.  ? ?Archer Asa, CPhT ? ? ?

## 2021-05-30 ENCOUNTER — Other Ambulatory Visit: Payer: Self-pay | Admitting: Family Medicine

## 2021-05-30 DIAGNOSIS — G8929 Other chronic pain: Secondary | ICD-10-CM

## 2021-05-30 NOTE — Telephone Encounter (Signed)
Requested medication (s) are due for refill today: yes ? ?Requested medication (s) are on the active medication list: yes   ? ?Last refill: 04/25/21 #30 0 refills ? ?Future visit scheduled yes 07/24/21 ? ?Notes to clinic:Not delegated, please review. Thank you. ? ?Requested Prescriptions  ?Pending Prescriptions Disp Refills  ? HYDROcodone-acetaminophen (NORCO/VICODIN) 5-325 MG tablet 30 tablet 0  ?  Sig: Take 1 tablet by mouth every 6 (six) hours as needed for moderate pain.  ?  ? Not Delegated - Analgesics:  Opioid Agonist Combinations Failed - 05/30/2021  1:45 PM  ?  ?  Failed - This refill cannot be delegated  ?  ?  Failed - Urine Drug Screen completed in last 360 days  ?  ?  Passed - Valid encounter within last 3 months  ?  Recent Outpatient Visits   ? ?      ? 1 week ago Uncontrolled type 2 diabetes mellitus with hyperglycemia (HCC)  ? [facility] Malva Limes, MD  ? 3 months ago Uncontrolled type 2 diabetes mellitus with hyperglycemia (HCC)  ? [employer] [facility], Erin E, PA-C  ? 3 months ago Uncontrolled type 2 diabetes mellitus with hyperglycemia (HCC)  ? [facility] Malva Limes, MD  ? 7 months ago Uncontrolled type 2 diabetes mellitus with hyperglycemia (HCC)  ? [facility] Malva Limes, MD  ? 11 months ago Uncontrolled type 2 diabetes mellitus with hyperglycemia (HCC)  ? [facility] Sherrie Mustache, Demetrios Isaacs, MD  ? ?  ?  ?Future Appointments   ? ?        ? In 1 month Fisher, Demetrios Isaacs, MD [facility]  ? ?  ? ? ?  ?  ?  ? ? ? ? ?

## 2021-05-30 NOTE — Telephone Encounter (Signed)
Copied from [place] [phone]. Topic: General - Other ?>> May 30, 2021 11:30 AM Tessa Lerner A wrote: ?Reason for CRM: Medication Refill - Medication: HYDROcodone-acetaminophen (NORCO/VICODIN) 5-325 MG tablet [id number]  ? ?Has the patient contacted their pharmacy? No. ?(Agent: If no, request that the patient contact the pharmacy for the refill. If patient does not wish to contact the pharmacy document the reason why and proceed with request.) ?(Agent: If yes, when and what did the pharmacy advise?) ? ?Preferred Pharmacy (with phone number or street name): [facility] [place], [address] [place] ?[place] [address] ?Phone: [phone] Fax: [phone] ?Hours: Not open 24 hours ? ?Has the patient been seen for an appointment in the last year OR does the patient have an upcoming appointment? Yes.   ? ?Agent: Please be advised that RX refills may take up to 3 business days. We ask that you follow-up with your pharmacy. ?

## 2021-05-31 MED ORDER — HYDROCODONE-ACETAMINOPHEN 5-325 MG PO TABS: 1.0000 | ORAL_TABLET | Freq: Four times a day (QID) | ORAL | 0 refills | Status: DC | PRN

## 2021-06-03 ENCOUNTER — Other Ambulatory Visit (HOSPITAL_COMMUNITY): Payer: Self-pay

## 2021-06-19 ENCOUNTER — Other Ambulatory Visit (HOSPITAL_COMMUNITY): Payer: Self-pay

## 2021-06-19 ENCOUNTER — Other Ambulatory Visit (HOSPITAL_COMMUNITY): Payer: Self-pay | Admitting: Internal Medicine

## 2021-06-19 DIAGNOSIS — I5020 Unspecified systolic (congestive) heart failure: Secondary | ICD-10-CM

## 2021-06-19 MED ORDER — DAPAGLIFLOZIN PROPANEDIOL 10 MG PO TABS
10.0000 mg | ORAL_TABLET | Freq: Every day | ORAL | 11 refills | Status: DC
  Filled 2021-06-19: qty 30, 30d supply, fill #0
  Filled 2021-07-22: qty 30, 30d supply, fill #1
  Filled 2021-08-21: qty 30, 30d supply, fill #2
  Filled 2021-09-20: qty 30, 30d supply, fill #3
  Filled 2021-10-17: qty 30, 30d supply, fill #4

## 2021-06-19 MED ORDER — METOPROLOL SUCCINATE ER 50 MG PO TB24
50.0000 mg | ORAL_TABLET | Freq: Every day | ORAL | 11 refills | Status: DC
  Filled 2021-06-19: qty 30, 30d supply, fill #0
  Filled 2021-07-22: qty 30, 30d supply, fill #1
  Filled 2021-08-21: qty 30, 30d supply, fill #2
  Filled 2021-09-20: qty 30, 30d supply, fill #3
  Filled 2021-10-17: qty 30, 30d supply, fill #4
  Filled 2021-11-19: qty 30, 30d supply, fill #5
  Filled 2021-12-19: qty 30, 30d supply, fill #6
  Filled 2022-01-16: qty 30, 30d supply, fill #7
  Filled 2022-02-20: qty 30, 30d supply, fill #8
  Filled 2022-03-27: qty 30, 30d supply, fill #9
  Filled 2022-04-28: qty 30, 30d supply, fill #10
  Filled 2022-05-29: qty 30, 30d supply, fill #11

## 2021-06-23 ENCOUNTER — Other Ambulatory Visit: Payer: Self-pay | Admitting: Family Medicine

## 2021-06-23 DIAGNOSIS — E1165 Type 2 diabetes mellitus with hyperglycemia: Secondary | ICD-10-CM

## 2021-07-03 ENCOUNTER — Other Ambulatory Visit (HOSPITAL_COMMUNITY): Payer: Self-pay | Admitting: Internal Medicine

## 2021-07-03 ENCOUNTER — Other Ambulatory Visit (HOSPITAL_COMMUNITY): Payer: Self-pay

## 2021-07-03 DIAGNOSIS — I5022 Chronic systolic (congestive) heart failure: Secondary | ICD-10-CM

## 2021-07-03 DIAGNOSIS — I5020 Unspecified systolic (congestive) heart failure: Secondary | ICD-10-CM

## 2021-07-03 DIAGNOSIS — E782 Mixed hyperlipidemia: Secondary | ICD-10-CM

## 2021-07-03 MED ORDER — ATORVASTATIN CALCIUM 40 MG PO TABS
40.0000 mg | ORAL_TABLET | Freq: Every day | ORAL | 3 refills | Status: DC
  Filled 2021-07-03: qty 30, 30d supply, fill #0
  Filled 2021-08-01: qty 30, 30d supply, fill #1
  Filled 2021-09-02: qty 30, 30d supply, fill #2
  Filled 2021-10-03: qty 30, 30d supply, fill #3
  Filled 2021-10-31: qty 30, 30d supply, fill #4

## 2021-07-03 MED ORDER — DIGOXIN 125 MCG PO TABS
0.1250 mg | ORAL_TABLET | Freq: Every day | ORAL | 3 refills | Status: DC
  Filled 2021-07-03: qty 30, 30d supply, fill #0
  Filled 2021-08-01: qty 30, 30d supply, fill #1
  Filled 2021-09-02: qty 30, 30d supply, fill #2
  Filled 2021-10-03: qty 30, 30d supply, fill #3
  Filled 2021-10-31: qty 30, 30d supply, fill #4
  Filled 2021-12-03: qty 30, 30d supply, fill #5
  Filled 2022-01-01: qty 30, 30d supply, fill #6

## 2021-07-03 MED ORDER — SPIRONOLACTONE 25 MG PO TABS
25.0000 mg | ORAL_TABLET | Freq: Every day | ORAL | 3 refills | Status: DC
  Filled 2021-07-03: qty 30, 30d supply, fill #0
  Filled 2021-08-01: qty 30, 30d supply, fill #1
  Filled 2021-09-02: qty 30, 30d supply, fill #2
  Filled 2021-10-03: qty 30, 30d supply, fill #3

## 2021-07-03 MED ORDER — FUROSEMIDE 20 MG PO TABS
20.0000 mg | ORAL_TABLET | Freq: Every day | ORAL | 3 refills | Status: DC
  Filled 2021-07-03: qty 30, 30d supply, fill #0
  Filled 2021-08-01: qty 30, 30d supply, fill #1
  Filled 2021-09-02: qty 30, 30d supply, fill #2
  Filled 2021-10-03: qty 30, 30d supply, fill #3
  Filled 2021-10-31: qty 30, 30d supply, fill #4
  Filled 2021-12-03: qty 30, 30d supply, fill #5
  Filled 2022-01-01: qty 30, 30d supply, fill #6

## 2021-07-22 ENCOUNTER — Other Ambulatory Visit (HOSPITAL_COMMUNITY): Payer: Self-pay

## 2021-07-24 ENCOUNTER — Other Ambulatory Visit: Payer: Self-pay

## 2021-07-24 ENCOUNTER — Encounter: Payer: Self-pay | Admitting: Family Medicine

## 2021-07-24 ENCOUNTER — Ambulatory Visit (INDEPENDENT_AMBULATORY_CARE_PROVIDER_SITE_OTHER): Payer: Self-pay | Admitting: Family Medicine

## 2021-07-24 VITALS — BP 109/67 | HR 70 | Temp 98.3°F | Resp 14

## 2021-07-24 DIAGNOSIS — G8929 Other chronic pain: Secondary | ICD-10-CM

## 2021-07-24 DIAGNOSIS — E871 Hypo-osmolality and hyponatremia: Secondary | ICD-10-CM

## 2021-07-24 DIAGNOSIS — I5022 Chronic systolic (congestive) heart failure: Secondary | ICD-10-CM

## 2021-07-24 DIAGNOSIS — E1165 Type 2 diabetes mellitus with hyperglycemia: Secondary | ICD-10-CM

## 2021-07-24 LAB — POCT GLYCOSYLATED HEMOGLOBIN (HGB A1C)
Est. average glucose Bld gHb Est-mCnc: 255
Hemoglobin A1C: 10.5 % — AB (ref 4.0–5.6)

## 2021-07-24 MED ORDER — HYDROCODONE-ACETAMINOPHEN 5-325 MG PO TABS: 1.0000 | ORAL_TABLET | Freq: Four times a day (QID) | ORAL | 0 refills | Status: DC | PRN

## 2021-07-24 NOTE — Progress Notes (Signed)
I,[name],acting as a scribe for [name], MD.,have documented all relevant documentation on the behalf of [name], MD,as directed by  [name], MD while in the presence of [name], MD.   Established patient visit   Patient: Curtis [name]   DOB: Jun 21, 1955   66 y.o. Male  MRN: [id number] Visit Date: 07/24/2021  Today's healthcare provider: [name], MD   Chief Complaint  Patient presents with   Diabetes   Subjective    Diabetes Associated symptoms include polydipsia. Pertinent negatives for diabetes include no chest pain.    Diabetes Mellitus Type II, Follow-up  Lab Results  Component Value Date   HGBA1C 10.5 (A) 07/24/2021   HGBA1C 9.4 (A) 02/01/2021   HGBA1C 8.8 (A) 10/29/2020   Wt Readings from Last 3 Encounters:  05/17/21 161 lb 14.4 oz (73.4 kg)  12/05/20 167 lb 12.8 oz (76.1 kg)  09/10/20 169 lb 8.5 oz (76.9 kg)   Last seen for diabetes on 05/17/2021. Patient was advised to continue increased dose of BID glipizide, and increased dose of Rybelsus to 65m which he started a few weeks ago  He reports good compliance with treatment, although has been eating more sweets and crackers until a few weeks ago.  He is not having side effects.  Symptoms: No fatigue No foot ulcerations  No appetite changes No nausea  No paresthesia of the feet  No polydipsia  No polyuria No visual disturbances   No vomiting     Home blood sugar records: fasting range: 230's  Episodes of hypoglycemia? No    Current insulin regiment: none Most Recent Eye Exam: 04/01/2021 Current exercise: none Current diet habits: well balanced  Pertinent Labs: Lab Results  Component Value Date   NA 129 (L) 02/15/2021   K 4.2 02/15/2021   CREATININE 1.22 02/15/2021   EGFR 65 02/15/2021   MICROALBUR 50 10/25/2019     ---------------------------------------------------------------------------------------------------   Medications: Outpatient  Medications Prior to Visit  Medication Sig   acetaminophen (TYLENOL) 500 MG tablet Take 2 tablets (1,000 mg total) by mouth every 6 (six) hours.   aspirin 81 MG chewable tablet Chew 1 tablet (81 mg total) by mouth daily.   atorvastatin (LIPITOR) 40 MG tablet TAKE 1 TABLET (40 MG TOTAL) BY MOUTH DAILY AT 6 PM.   dapagliflozin propanediol (FARXIGA) 10 MG TABS tablet Take 1 tablet (10 mg total) by mouth daily before breakfast.   digoxin (LANOXIN) 0.125 MG tablet Take 1 tablet (0.125 mg total) by mouth daily.   famotidine (PEPCID) 40 MG tablet Take 1 tablet (40 mg total) by mouth daily.   furosemide (LASIX) 20 MG tablet Take 1 tablet (20 mg total) by mouth daily.   glipiZIDE (GLUCOTROL) 10 MG tablet TAKE 1 TABLET(10 MG) BY MOUTH TWICE DAILY BEFORE A MEAL   HYDROcodone-acetaminophen (NORCO/VICODIN) 5-325 MG tablet Take 1 tablet by mouth every 6 (six) hours as needed for moderate pain.   Lancet Devices MISC Use to check blood sugar twice a day   metFORMIN (GLUCOPHAGE-XR) 500 MG 24 hr tablet TAKE 1 TABLET(500 MG) BY MOUTH DAILY   metoprolol succinate (TOPROL-XL) 50 MG 24 hr tablet Take 1 tablet (50 mg total) by mouth daily.   sacubitril-valsartan (ENTRESTO) 97-103 MG TAKE 1 TABLET BY MOUTH 2 (TWO) TIMES DAILY.   Semaglutide (RYBELSUS) 14 MG TABS Take 1 tablet (14 mg total) by mouth daily.   spironolactone (ALDACTONE) 25 MG tablet Take 1 tablet (25 mg total) by mouth  daily.   No facility-administered medications prior to visit.    Review of Systems  Constitutional:  Negative for appetite change, chills and fever.  Respiratory:  Negative for chest tightness, shortness of breath and wheezing.   Cardiovascular:  Negative for chest pain and palpitations.  Gastrointestinal:  Negative for abdominal pain, nausea and vomiting.  Endocrine: Positive for polydipsia.       Objective    BP 109/67 (BP Location: Left Arm, Patient Position: Sitting, Cuff Size: Normal)   Pulse 70   Temp 98.3 F (36.8 C)  (Oral)   Resp 14   SpO2 100% Comment: room air   Physical Exam    General: Appearance:    Well developed, well nourished male in no acute distress  Eyes:    PERRL, conjunctiva/corneas clear, EOM's intact       Lungs:     Clear to auscultation bilaterally, respirations unlabored  Heart:    Normal heart rate. Normal rhythm. No murmurs, rubs, or gallops.    MS:   All extremities are intact.    Neurologic:   Awake, alert, oriented x 3. No apparent focal neurological defect.         Results for orders placed or performed in visit on 07/24/21  POCT HgB A1C  Result Value Ref Range   Hemoglobin A1C 10.5 (A) 4.0 - 5.6 %   Est. average glucose Bld gHb Est-mCnc 255     Assessment & Plan     1. Uncontrolled type 2 diabetes mellitus with hyperglycemia (HCC) Now on maximum dose of Rybelsus, Farxiga and glipizide with worsening A1c. He does admit to consuming more sugars until a few weeks ago.   Discussed that he will likely need insulin to control his sugars. Will recheck in about 2 months and he is going to work on [employer] and giving Rybelsus more time to work. If we end up starting insulin will likely discontinue some of his oral medications, although he was advised should stay on dapagliflozin for congestive heart failure.   2. Hyponatremia Noted on last labs in January although he was a bit ill at that time.  - Renal function panel - TSH      The entirety of the information documented in the History of Present Illness, Review of Systems and Physical Exam were personally obtained by me. Portions of this information were initially documented by the CMA and reviewed by me for thoroughness and accuracy.     [name], MD  [facility] [phone] (phone) [phone] (fax)  [place]

## 2021-07-24 NOTE — Telephone Encounter (Signed)
Patient was seen in the office today and requested a refill on his pain medication. Patient would like refill sent to [facility] [place], [place]).

## 2021-07-24 NOTE — Patient Instructions (Signed)
Please review the attached list of medications and notify my office if there are any errors.   Please go to the lab draw station in [address] on the second floor of [facility]. Normal hours are 8:00am to 11:30am and 1:00pm to 4:00pm Monday through Friday  

## 2021-08-01 ENCOUNTER — Other Ambulatory Visit (HOSPITAL_COMMUNITY): Payer: Self-pay

## 2021-08-21 ENCOUNTER — Other Ambulatory Visit (HOSPITAL_COMMUNITY): Payer: Self-pay

## 2021-09-02 ENCOUNTER — Other Ambulatory Visit (HOSPITAL_COMMUNITY): Payer: Self-pay

## 2021-09-20 ENCOUNTER — Other Ambulatory Visit (HOSPITAL_COMMUNITY): Payer: Self-pay

## 2021-09-20 ENCOUNTER — Other Ambulatory Visit: Payer: Self-pay | Admitting: Family Medicine

## 2021-09-20 DIAGNOSIS — G8929 Other chronic pain: Secondary | ICD-10-CM

## 2021-09-20 NOTE — Telephone Encounter (Signed)
Medication Refill - Medication: HYDROcodone-acetaminophen (NORCO/VICODIN) 5-325 MG tablet  Has the patient contacted their pharmacy? Yes.     Preferred Pharmacy (with phone number or street name):  [facility] #[id number] - [place], [place] - [address] AT [facility] [address] [facility] Phone:  [phone]  Fax:  [phone]     Has the patient been seen for an appointment in the last year OR does the patient have an upcoming appointment? Yes.

## 2021-09-20 NOTE — Telephone Encounter (Signed)
Requested medication (s) are due for refill today - yes  Requested medication (s) are on the active medication list -yes  Future visit scheduled -yes  Last refill: 07/24/21 #30   Notes to clinic: non delegated Rx  Requested Prescriptions  Pending Prescriptions Disp Refills   HYDROcodone-acetaminophen (NORCO/VICODIN) 5-325 MG tablet 30 tablet 0    Sig: Take 1 tablet by mouth every 6 (six) hours as needed for moderate pain.     Not Delegated - Analgesics:  Opioid Agonist Combinations Failed - 09/20/2021 12:04 PM      Failed - This refill cannot be delegated      Failed - Urine Drug Screen completed in last 360 days      Passed - Valid encounter within last 3 months    Recent Outpatient Visits           1 month ago Uncontrolled type 2 diabetes mellitus with hyperglycemia [facility])   [facility] Malva Limes, MD   4 months ago Uncontrolled type 2 diabetes mellitus with hyperglycemia [facility])   [facility] Malva Limes, MD   7 months ago Uncontrolled type 2 diabetes mellitus with hyperglycemia (HCC)   [facility] Mecum, Erin E, PA-C   7 months ago Uncontrolled type 2 diabetes mellitus with hyperglycemia [facility])   [facility] Malva Limes, MD   10 months ago Uncontrolled type 2 diabetes mellitus with hyperglycemia [facility])   [facility] Malva Limes, MD       Future Appointments             In 2 weeks Fisher, Demetrios Isaacs, MD [facility]               Requested Prescriptions  Pending Prescriptions Disp Refills   HYDROcodone-acetaminophen (NORCO/VICODIN) 5-325 MG tablet 30 tablet 0    Sig: Take 1 tablet by mouth every 6 (six) hours as needed for moderate pain.     Not Delegated - Analgesics:  Opioid Agonist Combinations Failed - 09/20/2021 12:04 PM      Failed - This refill cannot be delegated      Failed - Urine Drug Screen completed in last 360 days      Passed - Valid  encounter within last 3 months    Recent Outpatient Visits           1 month ago Uncontrolled type 2 diabetes mellitus with hyperglycemia [facility])   [facility] Malva Limes, MD   4 months ago Uncontrolled type 2 diabetes mellitus with hyperglycemia [facility])   [facility] Malva Limes, MD   7 months ago Uncontrolled type 2 diabetes mellitus with hyperglycemia [facility])   [facility] Mecum, Erin E, PA-C   7 months ago Uncontrolled type 2 diabetes mellitus with hyperglycemia [facility])   [facility] Malva Limes, MD   10 months ago Uncontrolled type 2 diabetes mellitus with hyperglycemia [facility])   [facility] Malva Limes, MD       Future Appointments             In 2 weeks Fisher, Demetrios Isaacs, MD [facility], [facility]

## 2021-09-21 MED ORDER — HYDROCODONE-ACETAMINOPHEN 5-325 MG PO TABS: 1.0000 | ORAL_TABLET | Freq: Four times a day (QID) | ORAL | 0 refills | Status: DC | PRN

## 2021-09-30 LAB — HM DIABETES EYE EXAM

## 2021-10-03 ENCOUNTER — Other Ambulatory Visit (HOSPITAL_COMMUNITY): Payer: Self-pay

## 2021-10-03 IMAGING — CT CT RENAL STONE PROTOCOL
2 of 4 series · 15 of 46 positions shown, 17 images · non-contrast
Comparison: 10/15/2012

CLINICAL DATA: Hematuria of unknown cause. Emesis since [REDACTED].
Bloating.

EXAM:
CT ABDOMEN AND PELVIS WITHOUT CONTRAST
TECHNIQUE: Multidetector CT imaging of the abdomen and pelvis was performed
following the standard protocol without IV contrast.

[Series 2: stone full standard (person_name) · axial · 0.81mm/px · z∈[-782,-342]mm · 12 of 97 slices shown, 14 images]
[im 5/97  soft-tissue]
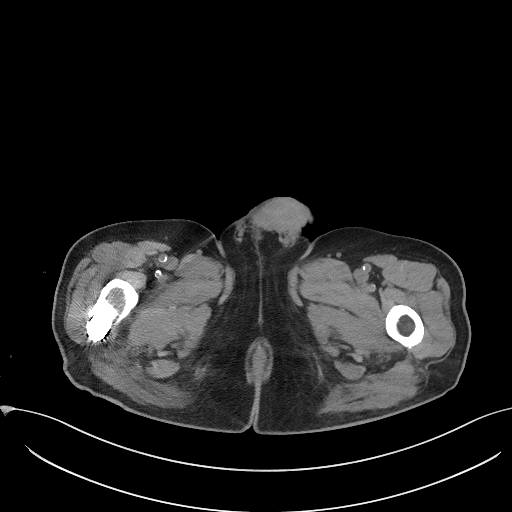
[im 5/97  bone]
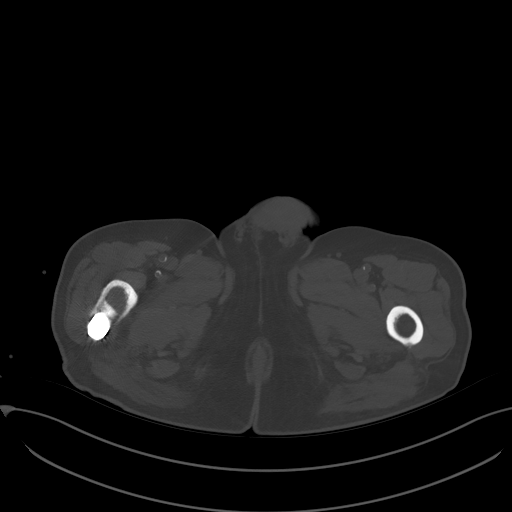
[im 13/97  soft-tissue]
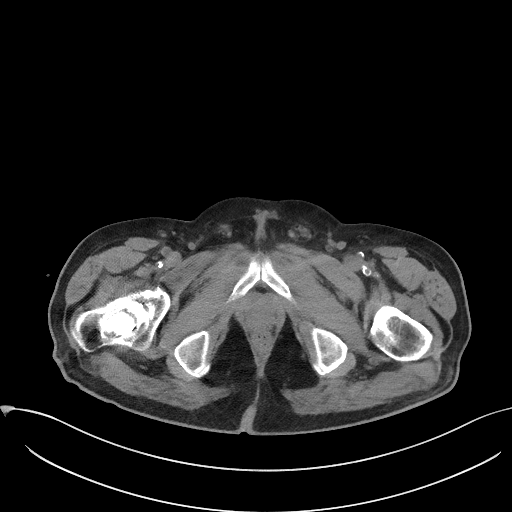
[im 21/97  soft-tissue]
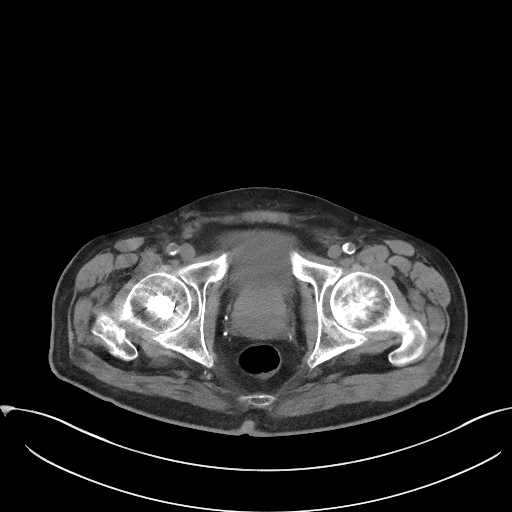
[im 29/97  soft-tissue]
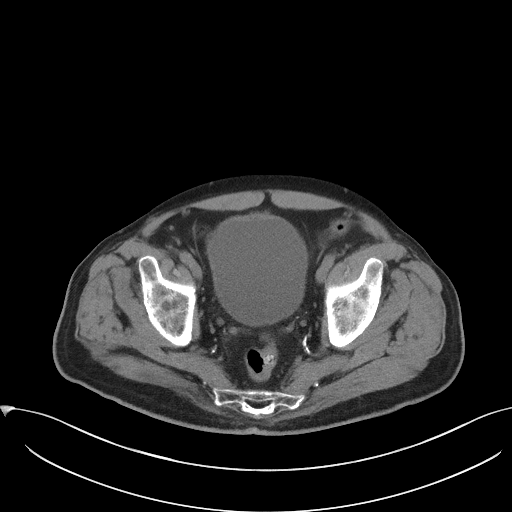
[im 37/97  soft-tissue]
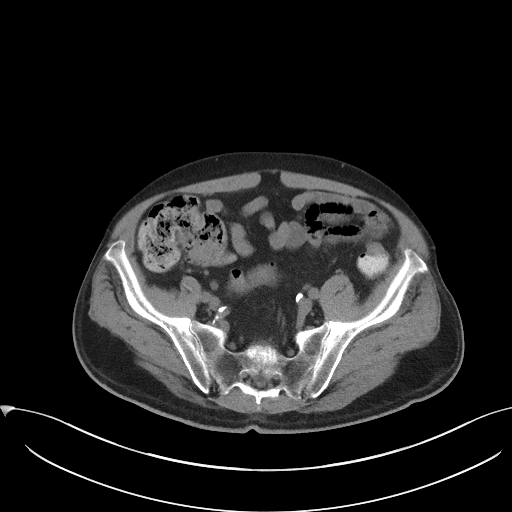
[im 45/97  soft-tissue]
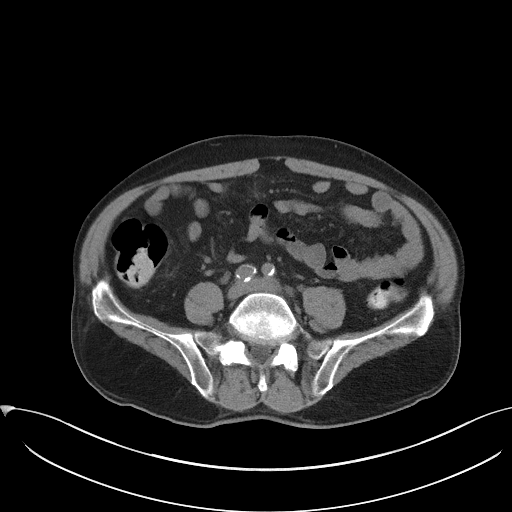
[im 53/97  soft-tissue]
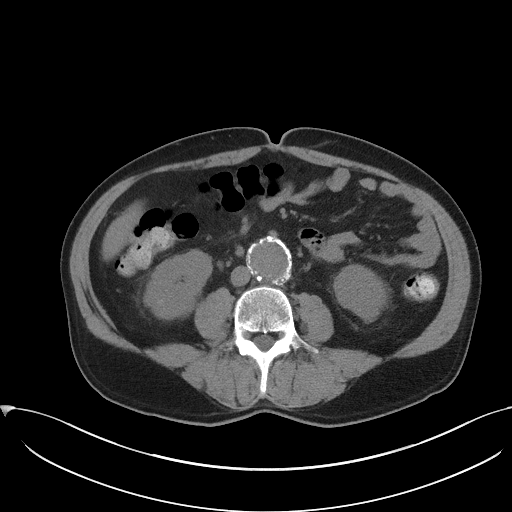
[im 61/97  soft-tissue]
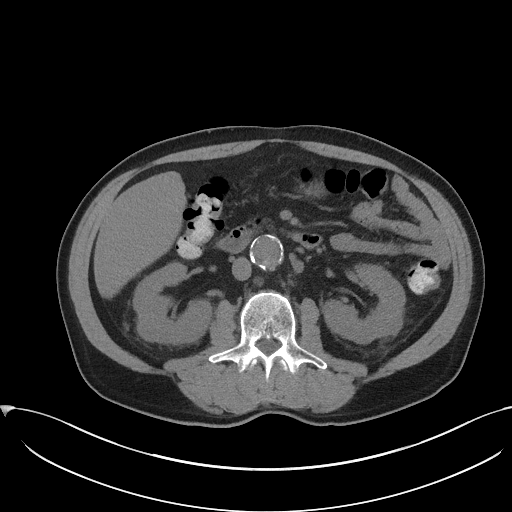
[im 69/97  soft-tissue]
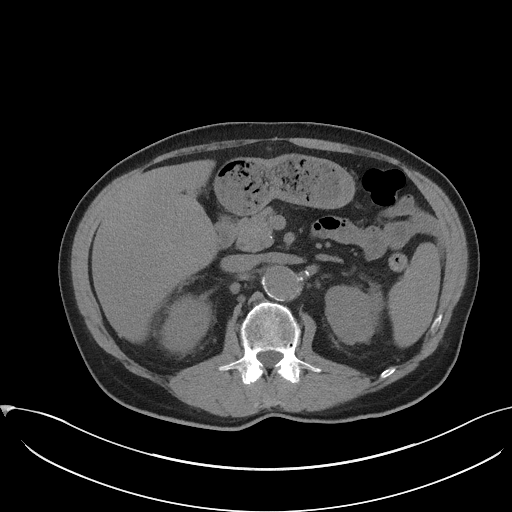
[im 69/97  bone]
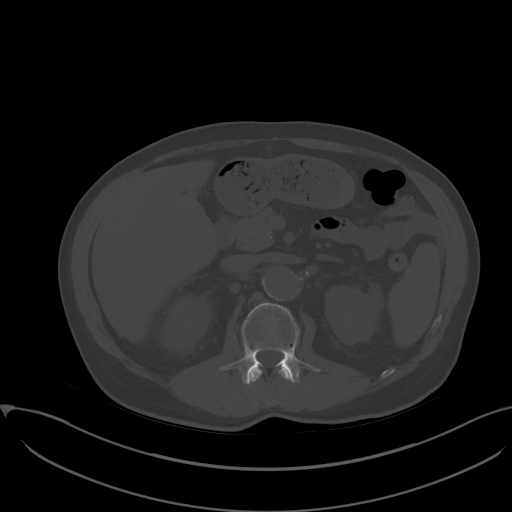
[im 77/97  soft-tissue]
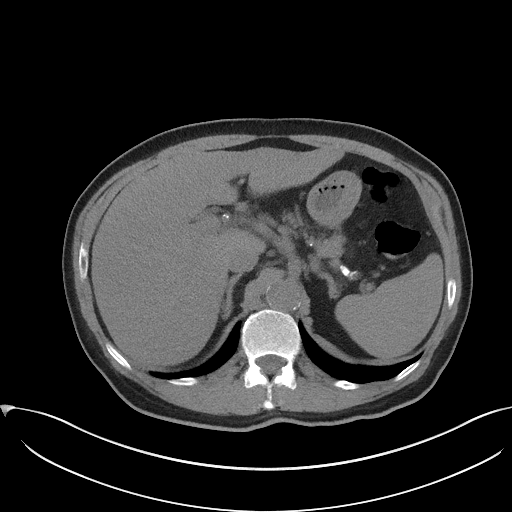
[im 85/97  soft-tissue]
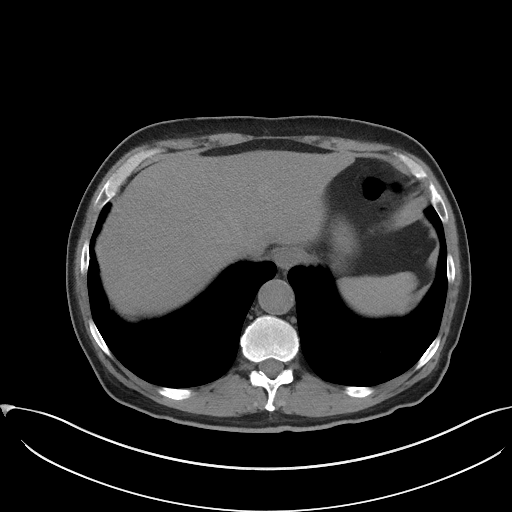
[im 93/97  soft-tissue]
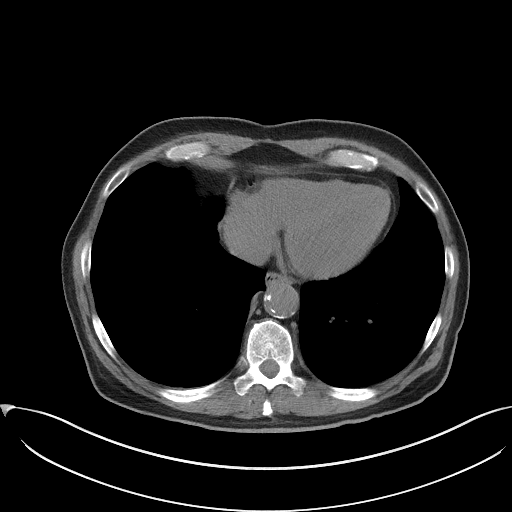

[Series 5: coronal · coronal · 0.70mm/px · 3 of 134 slices shown]
[im 45/134  soft-tissue]
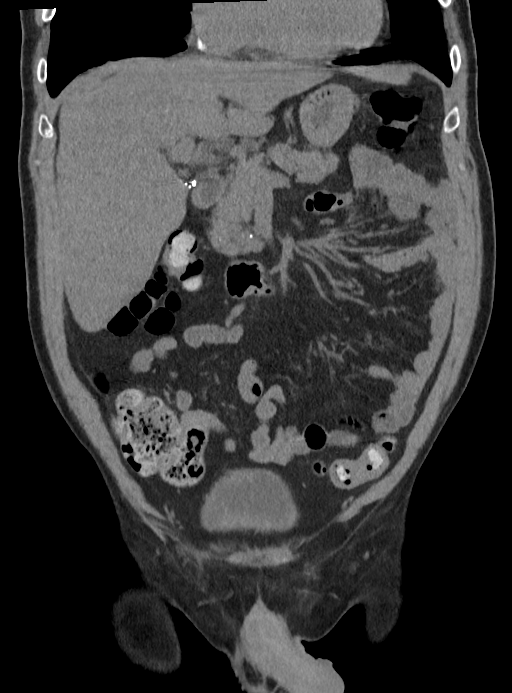
[im 60/134  soft-tissue]
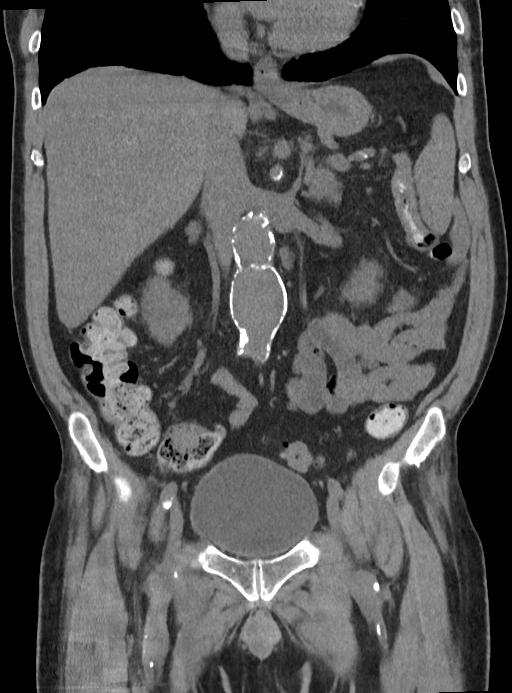
[im 74/134  soft-tissue]
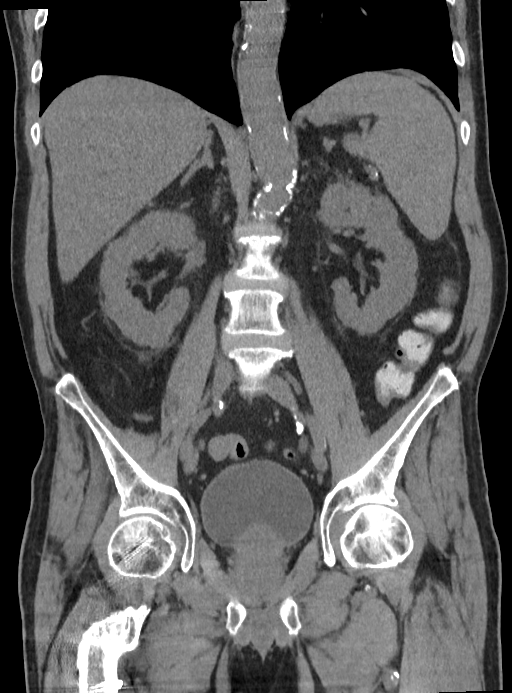

[15 of 46 positions shown; findings below may reference images not displayed]

FINDINGS: Lower chest: Severe emphysematous changes in the right lung base.

Hepatobiliary: No focal liver abnormality is seen. Status post
cholecystectomy. No biliary dilatation.

Pancreas: Unremarkable. No pancreatic ductal dilatation or
surrounding inflammatory changes.

Spleen: Calcified granulomas.

Adrenals/Urinary Tract: No adrenal gland nodules. 3 mm stone in the
lower pole left kidney. No hydronephrosis or hydroureter. No
ureteral or bladder stones identified. No bladder wall thickening.
Parenchymal scarring in the kidneys most prominent on the left.

Stomach/Bowel: Stomach is within normal limits. Appendix appears
normal. No evidence of bowel wall thickening, distention, or
inflammatory changes.

Vascular/Lymphatic: Diffuse calcification of the abdominal aorta.
Infrarenal abdominal aortic aneurysm measuring 4.3 cm AP diameter.
No significant lymphadenopathy.

Reproductive: Prostate gland is enlarged, measuring 4.9 cm diameter.

Other: No free air or free fluid in the abdomen. Abdominal wall
musculature appears intact.

Musculoskeletal: Degenerative changes in the spine. Internal
fixation of an old right proximal femoral fracture. No acute bony
abnormalities.
IMPRESSION: 1. Nonobstructing stone in the right kidney. No ureteral stone or
obstruction. Bilateral renal parenchymal scarring.
2. Severe emphysematous changes in the right lower lung.
3. Aortic atherosclerosis.
4. Infrarenal abdominal aortic aneurysm measuring 4.3 cm AP
diameter. Recommend follow-up every 12 months and vascular
consultation. This recommendation follows ACR consensus guidelines:
White Paper of the ACR Incidental Findings Committee II on Vascular
Findings. [HOSPITAL] 9874; [DATE].
5. Prostate enlargement.

## 2021-10-07 ENCOUNTER — Encounter: Payer: Self-pay | Admitting: Family Medicine

## 2021-10-07 ENCOUNTER — Ambulatory Visit (INDEPENDENT_AMBULATORY_CARE_PROVIDER_SITE_OTHER): Payer: Self-pay | Admitting: Family Medicine

## 2021-10-07 VITALS — BP 115/77 | HR 77 | Temp 97.9°F | Resp 16 | Wt 161.0 lb

## 2021-10-07 DIAGNOSIS — E785 Hyperlipidemia, unspecified: Secondary | ICD-10-CM

## 2021-10-07 DIAGNOSIS — I2510 Atherosclerotic heart disease of native coronary artery without angina pectoris: Secondary | ICD-10-CM

## 2021-10-07 DIAGNOSIS — I5020 Unspecified systolic (congestive) heart failure: Secondary | ICD-10-CM

## 2021-10-07 DIAGNOSIS — Z23 Encounter for immunization: Secondary | ICD-10-CM

## 2021-10-07 DIAGNOSIS — E1165 Type 2 diabetes mellitus with hyperglycemia: Secondary | ICD-10-CM

## 2021-10-07 DIAGNOSIS — D649 Anemia, unspecified: Secondary | ICD-10-CM

## 2021-10-07 LAB — POCT GLYCOSYLATED HEMOGLOBIN (HGB A1C)
Est. average glucose Bld gHb Est-mCnc: 243
Hemoglobin A1C: 10.1 % — AB (ref 4.0–5.6)

## 2021-10-07 NOTE — Progress Notes (Unsigned)
I,[name],acting as a scribe for [name], MD.,have documented all relevant documentation on the behalf of [name], MD,as directed by  [name], MD while in the presence of [name], MD.   Established patient visit   Patient: Curtis [name]   DOB: 1955-03-05   66 y.o. Male  MRN: [id number] Visit Date: 10/07/2021  Today's healthcare provider: [name], MD   Chief Complaint  Patient presents with   Diabetes   Subjective    Diabetes Pertinent negatives for diabetes include no chest pain.    Diabetes Mellitus Type II, Follow-up  Lab Results  Component Value Date   HGBA1C 10.1 (A) 10/07/2021   HGBA1C 10.5 (A) 07/24/2021   HGBA1C 9.4 (A) 02/01/2021   Wt Readings from Last 3 Encounters:  10/07/21 161 lb (73 kg)  05/17/21 161 lb 14.4 oz (73.4 kg)  12/05/20 167 lb 12.8 oz (76.1 kg)   Last seen for diabetes 2 months ago.  Management since then includes advising patient to work on healthier diet and continue same medication. He reports good compliance with treatment. He is not having side effects.  Symptoms: No fatigue No foot ulcerations  No appetite changes No nausea  No paresthesia of the feet  No polydipsia  No polyuria No visual disturbances   No vomiting     Home blood sugar records: fasting range: 190-210  Episodes of hypoglycemia? No    Current insulin regiment: none Most Recent Eye Exam: 04/01/2021 Current exercise: none Current diet habits: well balanced  Pertinent Labs: Lab Results  Component Value Date   CHOL 251 (H) 12/05/2020   HDL NOT REPORTED DUE TO HIGH TRIGLYCERIDES 12/05/2020   LDLCALC NOT CALCULATED 12/05/2020   LDLDIRECT 16.7 12/05/2020   TRIG 1,763 (H) 12/05/2020   CHOLHDL NOT REPORTED DUE TO HIGH TRIGLYCERIDES 12/05/2020   Lab Results  Component Value Date   NA 129 (L) 02/15/2021   K 4.2 02/15/2021   CREATININE 1.22 02/15/2021   EGFR 65 02/15/2021   MICROALBUR 50 10/25/2019      ---------------------------------------------------------------------------------------------------   Medications: Outpatient Medications Prior to Visit  Medication Sig   acetaminophen (TYLENOL) 500 MG tablet Take 2 tablets (1,000 mg total) by mouth every 6 (six) hours.   aspirin 81 MG chewable tablet Chew 1 tablet (81 mg total) by mouth daily.   atorvastatin (LIPITOR) 40 MG tablet TAKE 1 TABLET (40 MG TOTAL) BY MOUTH DAILY AT 6 PM.   dapagliflozin propanediol (FARXIGA) 10 MG TABS tablet Take 1 tablet (10 mg total) by mouth daily before breakfast.   digoxin (LANOXIN) 0.125 MG tablet Take 1 tablet (0.125 mg total) by mouth daily.   famotidine (PEPCID) 40 MG tablet Take 1 tablet (40 mg total) by mouth daily.   furosemide (LASIX) 20 MG tablet Take 1 tablet (20 mg total) by mouth daily.   glipiZIDE (GLUCOTROL) 10 MG tablet TAKE 1 TABLET(10 MG) BY MOUTH TWICE DAILY BEFORE A MEAL   HYDROcodone-acetaminophen (NORCO/VICODIN) 5-325 MG tablet Take 1 tablet by mouth every 6 (six) hours as needed for moderate pain.   Lancet Devices MISC Use to check blood sugar twice a day   metFORMIN (GLUCOPHAGE-XR) 500 MG 24 hr tablet TAKE 1 TABLET(500 MG) BY MOUTH DAILY   metoprolol succinate (TOPROL-XL) 50 MG 24 hr tablet Take 1 tablet (50 mg total) by mouth daily.   sacubitril-valsartan (ENTRESTO) 97-103 MG TAKE 1 TABLET BY MOUTH 2 (TWO) TIMES DAILY.   Semaglutide (RYBELSUS) 14 MG TABS Take  1 tablet (14 mg total) by mouth daily.   spironolactone (ALDACTONE) 25 MG tablet Take 1 tablet (25 mg total) by mouth daily.   No facility-administered medications prior to visit.    Review of Systems  Constitutional:  Negative for appetite change, chills and fever.  Respiratory:  Negative for chest tightness, shortness of breath and wheezing.   Cardiovascular:  Negative for chest pain and palpitations.  Gastrointestinal:  Negative for abdominal pain, nausea and vomiting.    {Labs  Heme  Chem  Endocrine  Serology   Results Review (optional):23779}   Objective    BP 115/77 (BP Location: Left Arm, Patient Position: Sitting, Cuff Size: Normal)   Pulse 77   Temp 97.9 F (36.6 C) (Oral)   Resp 16   Wt 161 lb (73 kg)   SpO2 100% Comment: room air  BMI 24.48 kg/m  {Show previous vital signs (optional):23777}  Physical Exam  ***  Results for orders placed or performed in visit on 10/07/21  POCT HgB A1C  Result Value Ref Range   Hemoglobin A1C 10.1 (A) 4.0 - 5.6 %   Est. average glucose Bld gHb Est-mCnc 243     Assessment & Plan     ***  No follow-ups on file.      {provider attestation***:1}   [name], MD  [facility] [phone] (phone) [phone] (fax)  [place]

## 2021-10-09 ENCOUNTER — Other Ambulatory Visit: Payer: Self-pay | Admitting: Family Medicine

## 2021-10-09 DIAGNOSIS — E875 Hyperkalemia: Secondary | ICD-10-CM

## 2021-10-15 LAB — COMPREHENSIVE METABOLIC PANEL
ALT: 17 IU/L (ref 0–44)
AST: 16 IU/L (ref 0–40)
Albumin/Globulin Ratio: 1.5 (ref 1.2–2.2)
Albumin: 4.6 g/dL (ref 3.9–4.9)
Alkaline Phosphatase: 60 IU/L (ref 44–121)
BUN/Creatinine Ratio: 34 — ABNORMAL HIGH (ref 10–24)
BUN: 54 mg/dL — ABNORMAL HIGH (ref 8–27)
Bilirubin Total: 0.3 mg/dL (ref 0.0–1.2)
CO2: 15 mmol/L — ABNORMAL LOW (ref 20–29)
Calcium: 9.8 mg/dL (ref 8.6–10.2)
Chloride: 97 mmol/L (ref 96–106)
Creatinine, Ser: 1.57 mg/dL — ABNORMAL HIGH (ref 0.76–1.27)
Globulin, Total: 3.1 g/dL (ref 1.5–4.5)
Glucose: 186 mg/dL — ABNORMAL HIGH (ref 70–99)
Potassium: 6.6 mmol/L (ref 3.5–5.2)
Sodium: 127 mmol/L — ABNORMAL LOW (ref 134–144)
Total Protein: 7.7 g/dL (ref 6.0–8.5)
eGFR: 48 mL/min/{1.73_m2} — ABNORMAL LOW (ref >59)

## 2021-10-15 LAB — LIPID PANEL
Chol/HDL Ratio: 39.8 ratio — ABNORMAL HIGH (ref 0.0–5.0)
Cholesterol, Total: 398 mg/dL — ABNORMAL HIGH (ref 100–199)
HDL: 10 mg/dL — ABNORMAL LOW (ref >39)
Triglycerides: 3282 mg/dL (ref 0–149)

## 2021-10-15 LAB — C-PEPTIDE: C-Peptide: 27.4 ng/mL — ABNORMAL HIGH (ref 1.1–4.4)

## 2021-10-15 LAB — TSH: TSH: 1.97 u[IU]/mL (ref 0.450–4.500)

## 2021-10-17 ENCOUNTER — Other Ambulatory Visit: Payer: Self-pay

## 2021-10-17 ENCOUNTER — Other Ambulatory Visit (HOSPITAL_COMMUNITY): Payer: Self-pay

## 2021-10-17 DIAGNOSIS — E875 Hyperkalemia: Secondary | ICD-10-CM

## 2021-10-18 ENCOUNTER — Telehealth (HOSPITAL_COMMUNITY): Payer: Self-pay

## 2021-10-18 ENCOUNTER — Other Ambulatory Visit (HOSPITAL_COMMUNITY): Payer: Self-pay

## 2021-10-18 LAB — RENAL FUNCTION PANEL
Albumin: 4.2 g/dL (ref 3.9–4.9)
BUN/Creatinine Ratio: 36 — ABNORMAL HIGH (ref 10–24)
BUN: 40 mg/dL — ABNORMAL HIGH (ref 8–27)
CO2: 14 mmol/L — ABNORMAL LOW (ref 20–29)
Calcium: 9.6 mg/dL (ref 8.6–10.2)
Chloride: 100 mmol/L (ref 96–106)
Creatinine, Ser: 1.12 mg/dL (ref 0.76–1.27)
Glucose: 218 mg/dL — ABNORMAL HIGH (ref 70–99)
Phosphorus: 3.9 mg/dL (ref 2.8–4.1)
Potassium: 5.5 mmol/L — ABNORMAL HIGH (ref 3.5–5.2)
Sodium: 133 mmol/L — ABNORMAL LOW (ref 134–144)
eGFR: 72 mL/min/{1.73_m2} (ref >59)

## 2021-10-18 NOTE — Telephone Encounter (Signed)
Advanced Heart Failure Patient Advocate Encounter  This patient is currently uninsured.  Due to changes with the HF Fund, patient will likely change from [place] to [place] and obtain Patient Assistance via [employer].  Need to contact patient.  Clista Bernhardt, CPhT Rx Patient Advocate Phone: [phone]

## 2021-10-21 ENCOUNTER — Telehealth: Payer: Self-pay

## 2021-10-21 NOTE — Telephone Encounter (Signed)
Advanced Heart Failure Patient Advocate Encounter  Left voicemail for patient. Will need to start forms for Jardiance [facility]) Patient Assistance.  Clista Bernhardt, CPhT Rx Patient Advocate Phone: [phone]

## 2021-10-21 NOTE — Telephone Encounter (Signed)
Pt given lab results per notes of Dr. Caryn Section on 10/21/21 . Pt's wife verbalized understanding. No cardiology f/u appt.

## 2021-10-22 NOTE — Telephone Encounter (Signed)
Spoke with patient and patient's wife. He was told by cardiology to f/u in a year with them. He is scheduled with Dr. Caryn Section on Nov 3.

## 2021-10-22 NOTE — Telephone Encounter (Signed)
He needs to get back in with cardiology regarding his cardiac medications. Does he want to go back to [place] and see Dr. Haroldine Laws, or a local cardiologist.   Also need to follow up here in 1 month regarding high potassium levels and sugars.

## 2021-10-23 MED ORDER — SPIRONOLACTONE 25 MG PO TABS: 25.0000 mg | ORAL_TABLET | Freq: Every day | ORAL | Status: DC

## 2021-10-23 NOTE — Addendum Note (Signed)
Addended by: Birdie Sons on: 10/23/2021 02:37 PM   Modules accepted: Orders

## 2021-10-28 NOTE — Telephone Encounter (Signed)
Advanced Heart Failure Patient Advocate Encounter  Left message on voicemail.  Clista Bernhardt, CPhT Rx Patient Advocate Phone: [phone]

## 2021-10-28 NOTE — Telephone Encounter (Signed)
Advanced Heart Failure Patient Advocate Encounter  Patient wife called back about assistance program. Discussed HF Fund changes, and that we will be changing therapy from [place] to [place] if approved.  Verified email and sent patient forms via [employer].  Clista Bernhardt, CPhT Rx Patient Advocate Phone: [phone]

## 2021-10-30 NOTE — Telephone Encounter (Signed)
Advanced Heart Failure Patient Advocate Encounter  Application for Jardiance faxed to [facility] on 10/30/2021. Application form attached to patient chart.  Clista Bernhardt, CPhT Rx Patient Advocate Phone: [phone]

## 2021-10-30 NOTE — Telephone Encounter (Addendum)
Advanced Heart Failure Patient Advocate Encounter   Patient was approved to receive Jardiance from [place] Effective dates: 10/30/2021 through 01/19/2022. Left message on patient voicemail about approval and contact number for refills. Determination letter has been added to patient chart.  Clista Bernhardt, CPhT Rx Patient Advocate Phone: [phone]

## 2021-10-31 ENCOUNTER — Other Ambulatory Visit (HOSPITAL_COMMUNITY): Payer: Self-pay

## 2021-11-06 ENCOUNTER — Other Ambulatory Visit: Payer: Self-pay | Admitting: Family Medicine

## 2021-11-06 DIAGNOSIS — G8929 Other chronic pain: Secondary | ICD-10-CM

## 2021-11-06 MED ORDER — HYDROCODONE-ACETAMINOPHEN 5-325 MG PO TABS: 1.0000 | ORAL_TABLET | Freq: Four times a day (QID) | ORAL | 0 refills | Status: DC | PRN

## 2021-11-06 NOTE — Telephone Encounter (Signed)
Says she has been calling since October 2nd

## 2021-11-06 NOTE — Telephone Encounter (Signed)
Medication Refill - Medication: HYDROcodone-acetaminophen (NORCO/VICODIN) 5-325 MG tablet   Has the patient contacted their pharmacy? Yes.   (Agent: If no, request that the patient contact the pharmacy for the refill. If patient does not wish to contact the pharmacy document the reason why and proceed with request.) (Agent: If yes, when and what did the pharmacy advise?)  Preferred Pharmacy (with phone number or street name):   [employer] [employer] [facility].  [facility] [place], [place] AT [facility] OF [address]  [place] [address]  Phone: [phone] Fax: [phone]   Has the patient been seen for an appointment in the last year OR does the patient have an upcoming appointment? Yes.    Agent: Please be advised that RX refills may take up to 3 business days. We ask that you follow-up with your pharmacy.

## 2021-11-06 NOTE — Telephone Encounter (Signed)
Pt's wife also states that she has been waiting since Monday on Mounjaro Samples, please advise when available for pickup. He will need to inject this Sunday to stay on schedule.

## 2021-11-06 NOTE — Telephone Encounter (Signed)
Requested medication (s) are due for refill today: Yes  Requested medication (s) are on the active medication list: Yes  Last refill:  09/21/21  Future visit scheduled: Yes  Notes to clinic:  Unable to refill per protocol, cannot delegate.Also patient asking for Mounjaro samples since Monday, call patient when they are available for pickup or if there are none available, he will need to inject on Sunday, see notes.      Requested Prescriptions  Pending Prescriptions Disp Refills   HYDROcodone-acetaminophen (NORCO/VICODIN) 5-325 MG tablet 30 tablet 0    Sig: Take 1 tablet by mouth every 6 (six) hours as needed for moderate pain.     Not Delegated - Analgesics:  Opioid Agonist Combinations Failed - 11/06/2021  1:52 PM      Failed - This refill cannot be delegated      Failed - Urine Drug Screen completed in last 360 days      Passed - Valid encounter within last 3 months    Recent Outpatient Visits           1 month ago Uncontrolled type 2 diabetes mellitus with hyperglycemia [facility])   [facility] Birdie Sons, MD   3 months ago Uncontrolled type 2 diabetes mellitus with hyperglycemia [facility])   [facility] Birdie Sons, MD   5 months ago Uncontrolled type 2 diabetes mellitus with hyperglycemia [facility])   [facility] Birdie Sons, MD   8 months ago Uncontrolled type 2 diabetes mellitus with hyperglycemia [facility])   [place], PA-C   9 months ago Uncontrolled type 2 diabetes mellitus with hyperglycemia [facility])   [facility] Birdie Sons, MD       Future Appointments             In 2 weeks Fisher, Kirstie Peri, MD [facility], [place]

## 2021-11-07 ENCOUNTER — Other Ambulatory Visit (HOSPITAL_COMMUNITY): Payer: Self-pay | Admitting: *Deleted

## 2021-11-07 MED ORDER — EMPAGLIFLOZIN 10 MG PO TABS: 10.0000 mg | ORAL_TABLET | Freq: Every day | ORAL | 6 refills | Status: DC

## 2021-11-07 NOTE — Telephone Encounter (Signed)
Do we have any samples?

## 2021-11-07 NOTE — Telephone Encounter (Signed)
There are no samples available. Advised patient's wife [name]. [name] stated out-of-pocket and with GoodRx, was $600-$700. Pasty wanted to know if there was something else they can do. Patient's next dose is due Monday. Please advise?

## 2021-11-07 NOTE — Telephone Encounter (Signed)
Please advise samples for Mounjaro?

## 2021-11-15 ENCOUNTER — Telehealth: Payer: Self-pay

## 2021-11-15 NOTE — Telephone Encounter (Signed)
Copied from [place] [id number]. Topic: General - Other >> Nov 15, 2021  1:33 PM Ja-Kwan M wrote: Reason for CRM: Pt wife Curtis [name] called to follow up on request of samples of Mounjaro. Curtis [name] stated pt missed his injection this Monday and is still waiting to hear back from someone regarding getting more samples or another medication. Cb# [phone]

## 2021-11-15 NOTE — Telephone Encounter (Signed)
I don't think we have any samples right now

## 2021-11-19 ENCOUNTER — Other Ambulatory Visit (HOSPITAL_COMMUNITY): Payer: Self-pay

## 2021-11-22 ENCOUNTER — Ambulatory Visit (INDEPENDENT_AMBULATORY_CARE_PROVIDER_SITE_OTHER): Payer: Self-pay | Admitting: Family Medicine

## 2021-11-22 ENCOUNTER — Encounter: Payer: Self-pay | Admitting: Family Medicine

## 2021-11-22 VITALS — BP 139/69 | HR 68 | Temp 97.9°F | Resp 16

## 2021-11-22 DIAGNOSIS — E782 Mixed hyperlipidemia: Secondary | ICD-10-CM

## 2021-11-22 DIAGNOSIS — L03115 Cellulitis of right lower limb: Secondary | ICD-10-CM

## 2021-11-22 DIAGNOSIS — E1165 Type 2 diabetes mellitus with hyperglycemia: Secondary | ICD-10-CM

## 2021-11-22 MED ORDER — ATORVASTATIN CALCIUM 80 MG PO TABS: 80.0000 mg | ORAL_TABLET | Freq: Every day | ORAL | 1 refills | Status: DC

## 2021-11-22 MED ORDER — CEPHALEXIN 500 MG PO CAPS: 500.0000 mg | ORAL_CAPSULE | Freq: Four times a day (QID) | ORAL | 0 refills | Status: AC

## 2021-11-22 MED ORDER — TIRZEPATIDE 2.5 MG/0.5ML ~~LOC~~ SOAJ: 2.5000 mg | SUBCUTANEOUS | 0 refills | Status: DC

## 2021-11-22 NOTE — Progress Notes (Signed)
I,[name],acting as a scribe for [name], MD.,have documented all relevant documentation on the behalf of [name], MD,as directed by  [name], MD while in the presence of [name], MD.    Established patient visit   Patient: Curtis [name]   DOB: [birth year]   66 y.o. Male  MRN: [id number] Visit Date: 11/22/2021  Today's healthcare provider: [name], MD   Chief Complaint  Patient presents with   Diabetes   hyperkalemia   Subjective    HPI  Diabetes Mellitus Type II, Follow-up  Lab Results  Component Value Date   HGBA1C 10.1 (A) 10/07/2021   HGBA1C 10.5 (A) 07/24/2021   HGBA1C 9.4 (A) 02/01/2021   Wt Readings from Last 3 Encounters:  10/07/21 161 lb (73 kg)  05/17/21 161 lb 14.4 oz (73.4 kg)  12/05/20 167 lb 12.8 oz (76.1 kg)   Last seen for diabetes on 10/07/2021.   Management since then includes replacing Rybelsus with  Mounjaro.  He reports fair compliance with treatment. Has been out of [facility] for 2 weeks.  He is not having side effects.  Symptoms: No fatigue Yes foot ulcerations  No appetite changes No nausea  Yes paresthesia of the feet  No polydipsia  Yes polyuria No visual disturbances   No vomiting     Home blood sugar records:  300's  Episodes of hypoglycemia? No   Most Recent Eye Exam: 09/30/2021 Current exercise: none Current diet habits: in general, an "unhealthy" diet  Pertinent Labs: Lab Results  Component Value Date   CHOL 398 (H) 10/07/2021   HDL 10 (L) 10/07/2021   LDLCALC Comment (A) 10/07/2021   LDLDIRECT 16.7 12/05/2020   TRIG 3,282 (HH) 10/07/2021   CHOLHDL 39.8 (H) 10/07/2021   Lab Results  Component Value Date   NA 133 (L) 10/17/2021   K 5.5 (H) 10/17/2021   CREATININE 1.12 10/17/2021   EGFR 72 10/17/2021   MICROALBUR 50 10/25/2019     ---------------------------------------------------------------------------------------------------   Follow up for hyperkalemia:  The patient  was last seen for this on 10/07/2021.   Changes made at last visit include putting spironolactone on hold due to high potassium levels. Labs were rechecked on 10/17/2021 showing improved potassium and sodium levels.  He reports good compliance with treatment.  -----------------------------------------------------------------------------------------   Medications: Outpatient Medications Prior to Visit  Medication Sig   acetaminophen (TYLENOL) 500 MG tablet Take 2 tablets (1,000 mg total) by mouth every 6 (six) hours.   aspirin 81 MG chewable tablet Chew 1 tablet (81 mg total) by mouth daily.   atorvastatin (LIPITOR) 40 MG tablet TAKE 1 TABLET (40 MG TOTAL) BY MOUTH DAILY AT 6 PM.   digoxin (LANOXIN) 0.125 MG tablet Take 1 tablet (0.125 mg total) by mouth daily.   empagliflozin (JARDIANCE) 10 MG TABS tablet Take 1 tablet (10 mg total) by mouth daily before breakfast.   famotidine (PEPCID) 40 MG tablet Take 1 tablet (40 mg total) by mouth daily.   furosemide (LASIX) 20 MG tablet Take 1 tablet (20 mg total) by mouth daily.   glipiZIDE (GLUCOTROL) 10 MG tablet TAKE 1 TABLET(10 MG) BY MOUTH TWICE DAILY BEFORE A MEAL   HYDROcodone-acetaminophen (NORCO/VICODIN) 5-325 MG tablet Take 1 tablet by mouth every 6 (six) hours as needed for moderate pain.   Lancet Devices MISC Use to check blood sugar twice a day   metFORMIN (GLUCOPHAGE-XR) 500 MG 24 hr tablet TAKE 1 TABLET(500 MG) BY MOUTH DAILY  metoprolol succinate (TOPROL-XL) 50 MG 24 hr tablet Take 1 tablet (50 mg total) by mouth daily.   sacubitril-valsartan (ENTRESTO) 97-103 MG TAKE 1 TABLET BY MOUTH 2 (TWO) TIMES DAILY.   Semaglutide (RYBELSUS) 14 MG TABS Take 1 tablet (14 mg total) by mouth daily. (Patient not taking: Reported on 11/22/2021)   spironolactone (ALDACTONE) 25 MG tablet Take 1 tablet (25 mg total) by mouth daily. ON HOLD DUE TO HYPERKALEMIA (Patient not taking: Reported on 11/22/2021)   No facility-administered medications prior to  visit.    Review of Systems  Constitutional:  Negative for appetite change, chills and fever.  Respiratory:  Negative for chest tightness, shortness of breath and wheezing.   Cardiovascular:  Negative for chest pain and palpitations.  Gastrointestinal:  Negative for abdominal pain, nausea and vomiting.  Endocrine: Positive for polyphagia.  Skin:  Positive for wound (left foot).       Objective    BP 139/69 (BP Location: Right Arm, Patient Position: Sitting, Cuff Size: Normal)   Pulse 68   Temp 97.9 F (36.6 C) (Oral)   Resp 16   SpO2 100% Comment: room air   Physical Exam   General appearance: Well developed, well nourished male, cooperative and in no acute distress Head: Normocephalic, without obvious abnormality, atraumatic Respiratory: Respirations even and unlabored, normal respiratory rate Extremities: All extremities are intact.  Skin: Several exophytic scabby lesions anterior of both lower legs with faint 4-5 cm tender surrounding erythema.  Psych: Appropriate mood and affect. Neurologic: Mental status: Alert, oriented to person, place, and time, thought content appropriate.    Assessment & Plan     1. Cellulitis of right lower extremity  - cephALEXin (KEFLEX) 500 MG capsule; Take 1 capsule (500 mg total) by mouth 4 (four) times daily for 10 days.  Dispense: 40 capsule; Refill: 0  2. Mixed hyperlipidemia - atorvastatin (LIPITOR) 80 MG tablet; Take 1 tablet (80 mg total) by mouth daily.  Dispense: 90 tablet; Refill: 1 Very high TC and triglycerides despite patient report that he is taking atorvastatin consistently. Will increase 80mg daily. Check lipids in 2-3 weeks.  Discussed options of PCSK-9 inhibitors if increase dose of atorvastatin does not make significant improvement.   3. Uncontrolled type 2 diabetes mellitus with hyperglycemia (HCC) Did well with one month trial of 2.5mg mounjaro samples. Given another month of samples and will attempt patient assistant  to for 5mg injections to start in a month.   - Renal function panel - Hemoglobin A1c      The entirety of the information documented in the History of Present Illness, Review of Systems and Physical Exam were personally obtained by me. Portions of this information were initially documented by the CMA and reviewed by me for thoroughness and accuracy.     Donald Fisher, MD  [facility] [phone] (phone) [phone] (fax)  [facility]  

## 2021-11-22 NOTE — Patient Instructions (Addendum)
Please review the attached list of medications and notify my office if there are any errors.   Please go to the lab draw station in [address] on the second floor of [facility] sometime during the week of November 13 while fasting for 8 hours. Normal hours are 8:00am to 11:30am and 1:00pm to 4:00pm Monday through Friday

## 2021-12-03 ENCOUNTER — Other Ambulatory Visit (HOSPITAL_COMMUNITY): Payer: Self-pay

## 2021-12-11 LAB — RENAL FUNCTION PANEL
Albumin: 4.2 g/dL (ref 3.9–4.9)
BUN/Creatinine Ratio: 21 (ref 10–24)
BUN: 22 mg/dL (ref 8–27)
CO2: 18 mmol/L — ABNORMAL LOW (ref 20–29)
Calcium: 9.5 mg/dL (ref 8.6–10.2)
Chloride: 99 mmol/L (ref 96–106)
Creatinine, Ser: 1.07 mg/dL (ref 0.76–1.27)
Glucose: 239 mg/dL — ABNORMAL HIGH (ref 70–99)
Phosphorus: 3.7 mg/dL (ref 2.8–4.1)
Potassium: 4.2 mmol/L (ref 3.5–5.2)
Sodium: 137 mmol/L (ref 134–144)
eGFR: 76 mL/min/{1.73_m2} (ref >59)

## 2021-12-11 LAB — LIPID PANEL
Chol/HDL Ratio: 18.5 ratio — ABNORMAL HIGH (ref 0.0–5.0)
Cholesterol, Total: 204 mg/dL — ABNORMAL HIGH (ref 100–199)
HDL: 11 mg/dL — ABNORMAL LOW (ref >39)
Triglycerides: 1700 mg/dL (ref 0–149)

## 2021-12-11 LAB — HEMOGLOBIN A1C
Est. average glucose Bld gHb Est-mCnc: 272 mg/dL
Hgb A1c MFr Bld: 11.1 % — ABNORMAL HIGH (ref 4.8–5.6)

## 2021-12-16 ENCOUNTER — Other Ambulatory Visit: Payer: Self-pay | Admitting: Family Medicine

## 2021-12-16 ENCOUNTER — Telehealth: Payer: Self-pay

## 2021-12-16 DIAGNOSIS — G8929 Other chronic pain: Secondary | ICD-10-CM

## 2021-12-16 NOTE — Telephone Encounter (Signed)
Copied from refill encounter:  Curtis [name] called stating that she is still waiting to hear an update from the [facility] request from this morning. Please advise, patient will need samples by the weeked.

## 2021-12-16 NOTE — Telephone Encounter (Signed)
There is no patient assistance for Mounjaro. Please advise patient on what you would like him to do.   Angelena Sole, PharmD, [name] [place], CPP  Clinical Pharmacist Practitioner  [facility] [phone]

## 2021-12-16 NOTE — Telephone Encounter (Signed)
Pts wife is calling to speak to [facility] regarding monjaro. Pt has run out of medication. Waiting to hear about financial assistance. Please advise CB- [phone]

## 2021-12-16 NOTE — Telephone Encounter (Signed)
Sent request for Mounjaro samples in another encounter to the office today.

## 2021-12-16 NOTE — Telephone Encounter (Signed)
[name] called stating that she is still waiting to hear an update from the [facility] request from this morning. Please advise, patient will need samples by the weeked.   Medication Refill - Medication: HYDROcodone-acetaminophen (NORCO/VICODIN) 5-325 MG table   Has the patient contacted their pharmacy? Yes.   (Agent: If no, request that the patient contact the pharmacy for the refill. If patient does not wish to contact the pharmacy document the reason why and proceed with request.) (Agent: If yes, when and what did the pharmacy advise?)  Preferred Pharmacy (with phone number or street name):  [id number] [place], [place] - [address] AT [facility] OF [address] [facility]  [address] [place] [address]  Phone: [phone] Fax: [phone]   Has the patient been seen for an appointment in the last year OR does the patient have an upcoming appointment? Yes.    Agent: Please be advised that RX refills may take up to 3 business days. We ask that you follow-up with your pharmacy.

## 2021-12-16 NOTE — Telephone Encounter (Signed)
I'd like him to continue sample of mounjaro for now. I've got a sample with his name on it in the refrigerator that he can pick up and will get him by for 4 weeks, and am working with drug rep to get more.   Thanks!

## 2021-12-17 MED ORDER — HYDROCODONE-ACETAMINOPHEN 5-325 MG PO TABS: 1.0000 | ORAL_TABLET | Freq: Four times a day (QID) | ORAL | 0 refills | Status: DC | PRN

## 2021-12-17 NOTE — Telephone Encounter (Signed)
Requested medication (s) are due for refill today - yes  Requested medication (s) are on the active medication list -yes  Future visit scheduled -no  Last refill: 11/06/21 #30  Notes to clinic: non delegated Rx  Requested Prescriptions  Pending Prescriptions Disp Refills   HYDROcodone-acetaminophen (NORCO/VICODIN) 5-325 MG tablet 30 tablet 0    Sig: Take 1 tablet by mouth every 6 (six) hours as needed for moderate pain.     Not Delegated - Analgesics:  Opioid Agonist Combinations Failed - 12/16/2021  2:54 PM      Failed - This refill cannot be delegated      Failed - Urine Drug Screen completed in last 360 days      Passed - Valid encounter within last 3 months    Recent Outpatient Visits           3 weeks ago Cellulitis of right lower extremity   [facility] Malva Limes, MD   2 months ago Uncontrolled type 2 diabetes mellitus with hyperglycemia [facility])   [facility] Malva Limes, MD   4 months ago Uncontrolled type 2 diabetes mellitus with hyperglycemia [facility])   [facility] Malva Limes, MD   7 months ago Uncontrolled type 2 diabetes mellitus with hyperglycemia [facility])   [facility] Malva Limes, MD   10 months ago Uncontrolled type 2 diabetes mellitus with hyperglycemia (HCC)   [facility] Mecum, Oswaldo Conroy, PA-C                 Requested Prescriptions  Pending Prescriptions Disp Refills   HYDROcodone-acetaminophen (NORCO/VICODIN) 5-325 MG tablet 30 tablet 0    Sig: Take 1 tablet by mouth every 6 (six) hours as needed for moderate pain.     Not Delegated - Analgesics:  Opioid Agonist Combinations Failed - 12/16/2021  2:54 PM      Failed - This refill cannot be delegated      Failed - Urine Drug Screen completed in last 360 days      Passed - Valid encounter within last 3 months    Recent Outpatient Visits           3 weeks ago Cellulitis of right lower extremity    [facility] Malva Limes, MD   2 months ago Uncontrolled type 2 diabetes mellitus with hyperglycemia [facility])   [facility] Malva Limes, MD   4 months ago Uncontrolled type 2 diabetes mellitus with hyperglycemia [facility])   [facility] Malva Limes, MD   7 months ago Uncontrolled type 2 diabetes mellitus with hyperglycemia [facility])   [facility] Malva Limes, MD   10 months ago Uncontrolled type 2 diabetes mellitus with hyperglycemia [facility])   [facility] Mecum, Oswaldo Conroy, PA-C

## 2021-12-19 ENCOUNTER — Other Ambulatory Visit (HOSPITAL_COMMUNITY): Payer: Self-pay

## 2021-12-19 NOTE — Telephone Encounter (Signed)
Spoke with patient and advised there are samples here for him to pick up.

## 2021-12-19 NOTE — Telephone Encounter (Signed)
Please advise 

## 2021-12-19 NOTE — Telephone Encounter (Signed)
I think there is a sample in a bag with his name on it in the sample fridge.

## 2021-12-24 ENCOUNTER — Telehealth (HOSPITAL_COMMUNITY): Payer: Self-pay | Admitting: Licensed Clinical Social Worker

## 2021-12-24 NOTE — Telephone Encounter (Signed)
CSW reaching out to patient because they are currently receiving medications through [facility].  CSW called to discuss if pt is eligible for any alternative coverage options- appears as if pt should be eligible for Medicare but does not currently have benefits.  Unable to reach patient at either number- left VM requesting return call.  CSW will continue to follow to assist as needed.  Burna Sis, LCSW Clinical Social Worker [facility] Desk#: [phone] Cell#: [phone]

## 2022-01-01 ENCOUNTER — Other Ambulatory Visit (HOSPITAL_COMMUNITY): Payer: Self-pay

## 2022-01-08 ENCOUNTER — Telehealth (HOSPITAL_COMMUNITY): Payer: Self-pay | Admitting: Vascular Surgery

## 2022-01-08 NOTE — Telephone Encounter (Signed)
Called pt to move 12/28 appt w/ DB / VM is full

## 2022-01-09 NOTE — Telephone Encounter (Signed)
Copied from CRM [id number]. Topic: General - Other >> Jan 09, 2022  3:24 PM Ja-Kwan M wrote: Reason for CRM: Pt spouse [name] reports that pt will be out of the tirzepatide [facility]) 2.5 MG/0.5ML Pen because he only has 1 shot remaining for Sunday. [name] also stated that they have not heard from [place] in regards to the program.

## 2022-01-10 NOTE — Telephone Encounter (Signed)
No answer and no vm. Okay for pec to advise.

## 2022-01-10 NOTE — Telephone Encounter (Signed)
There is a samples pen in the fridge with his name on it.  There is no patient assistance program for mounjaro, but expect there to be one sometime after the new year.

## 2022-01-16 ENCOUNTER — Encounter (HOSPITAL_COMMUNITY): Payer: Self-pay | Admitting: Internal Medicine

## 2022-01-16 ENCOUNTER — Other Ambulatory Visit (HOSPITAL_COMMUNITY): Payer: Self-pay

## 2022-01-16 ENCOUNTER — Ambulatory Visit (HOSPITAL_COMMUNITY)
Admission: RE | Admit: 2022-01-16 | Discharge: 2022-01-16 | Disposition: A | Discharge status: Home / Self Care | Payer: Self-pay | Source: Ambulatory Visit | Attending: Internal Medicine | Admitting: Internal Medicine

## 2022-01-16 VITALS — BP 118/70 | HR 77 | Wt 177.0 lb

## 2022-01-16 DIAGNOSIS — I5022 Chronic systolic (congestive) heart failure: Secondary | ICD-10-CM

## 2022-01-16 DIAGNOSIS — Z951 Presence of aortocoronary bypass graft: Secondary | ICD-10-CM

## 2022-01-16 DIAGNOSIS — Z87891 Personal history of nicotine dependence: Secondary | ICD-10-CM | POA: Insufficient documentation

## 2022-01-16 DIAGNOSIS — Z7984 Long term (current) use of oral hypoglycemic drugs: Secondary | ICD-10-CM | POA: Insufficient documentation

## 2022-01-16 DIAGNOSIS — E1165 Type 2 diabetes mellitus with hyperglycemia: Secondary | ICD-10-CM | POA: Insufficient documentation

## 2022-01-16 DIAGNOSIS — Z7982 Long term (current) use of aspirin: Secondary | ICD-10-CM | POA: Insufficient documentation

## 2022-01-16 DIAGNOSIS — R600 Localized edema: Secondary | ICD-10-CM | POA: Insufficient documentation

## 2022-01-16 DIAGNOSIS — E785 Hyperlipidemia, unspecified: Secondary | ICD-10-CM

## 2022-01-16 DIAGNOSIS — I255 Ischemic cardiomyopathy: Secondary | ICD-10-CM | POA: Insufficient documentation

## 2022-01-16 DIAGNOSIS — S069XAS Unspecified intracranial injury with loss of consciousness status unknown, sequela: Secondary | ICD-10-CM | POA: Insufficient documentation

## 2022-01-16 DIAGNOSIS — Z993 Dependence on wheelchair: Secondary | ICD-10-CM | POA: Insufficient documentation

## 2022-01-16 DIAGNOSIS — I110 Hypertensive heart disease with heart failure: Secondary | ICD-10-CM | POA: Insufficient documentation

## 2022-01-16 DIAGNOSIS — Z79899 Other long term (current) drug therapy: Secondary | ICD-10-CM | POA: Insufficient documentation

## 2022-01-16 DIAGNOSIS — I5020 Unspecified systolic (congestive) heart failure: Secondary | ICD-10-CM

## 2022-01-16 DIAGNOSIS — I2510 Atherosclerotic heart disease of native coronary artery without angina pectoris: Secondary | ICD-10-CM

## 2022-01-16 LAB — BASIC METABOLIC PANEL
Anion gap: 11 (ref 5–15)
BUN: 21 mg/dL (ref 8–23)
CO2: 19 mmol/L — ABNORMAL LOW (ref 22–32)
Calcium: 9 mg/dL (ref 8.9–10.3)
Chloride: 104 mmol/L (ref 98–111)
Creatinine, Ser: 1.14 mg/dL (ref 0.61–1.24)
GFR, Estimated: >60 mL/min (ref >60)
Glucose, Bld: 231 mg/dL — ABNORMAL HIGH (ref 70–99)
Potassium: 3.5 mmol/L (ref 3.5–5.1)
Sodium: 134 mmol/L — ABNORMAL LOW (ref 135–145)

## 2022-01-16 LAB — BRAIN NATRIURETIC PEPTIDE: B Natriuretic Peptide: 165.6 pg/mL — ABNORMAL HIGH (ref 0.0–100.0)

## 2022-01-16 MED ORDER — FUROSEMIDE 20 MG PO TABS
40.0000 mg | ORAL_TABLET | Freq: Every day | ORAL | 3 refills | Status: DC
  Filled 2022-01-16: qty 60, 30d supply, fill #0
  Filled 2022-03-03: qty 60, 30d supply, fill #1
  Filled 2022-04-03: qty 60, 30d supply, fill #2
  Filled 2022-05-05: qty 60, 30d supply, fill #3
  Filled 2022-06-06: qty 60, 30d supply, fill #4
  Filled 2022-06-25 – 2022-07-08 (×3): qty 60, 30d supply, fill #5
  Filled 2022-08-08: qty 60, 30d supply, fill #6
  Filled 2022-09-10 – 2022-09-11 (×2): qty 60, 30d supply, fill #7
  Filled 2022-10-10: qty 60, 30d supply, fill #8
  Filled 2022-11-13: qty 60, 30d supply, fill #9
  Filled 2022-12-15: qty 60, 30d supply, fill #10
  Filled 2023-01-09: qty 60, 30d supply, fill #11

## 2022-01-16 NOTE — Patient Instructions (Signed)
STOP Digoxin  INCREASE Lasix to 40 mg daily.  Labs done today, your results will be available in MyChart, we will contact you for abnormal readings.  Your physician has requested that you have an echocardiogram. Echocardiography is a painless test that uses sound waves to create images of your heart. It provides your doctor with information about the size and shape of your heart and how well your heart's chambers and valves are working. This procedure takes approximately one hour. There are no restrictions for this procedure. Please do NOT wear cologne, perfume, aftershave, or lotions (deodorant is allowed). Please arrive 15 minutes prior to your appointment time.  PLEASE WEAR YOUR COMPRESSION STOCKINGS.  Your physician recommends that you schedule a follow-up appointment in: 1 month   If you have any questions or concerns before your next appointment please send [place] a message through [place] or call our office at [phone].    TO LEAVE A MESSAGE FOR THE NURSE SELECT OPTION 2, PLEASE LEAVE A MESSAGE INCLUDING: YOUR NAME DATE OF BIRTH CALL BACK NUMBER REASON FOR CALL**this is important as we prioritize the call backs  YOU WILL RECEIVE A CALL BACK THE SAME DAY AS LONG AS YOU CALL BEFORE 4:00 PM  At the [facility], you and your health needs are our priority. As part of our continuing mission to provide you with exceptional heart care, we have created designated Provider Care Teams. These Care Teams include your primary Cardiologist (physician) and Advanced Practice Providers (APPs- Physician Assistants and Nurse Practitioners) who all work together to provide you with the care you need, when you need it.   You may see any of the following providers on your designated Care Team at your next follow up: Dr Arvilla Meres Dr Marca Ancona Dr. Marcos Eke, NP Robbie Lis, [place] [facility], [place] Brynda Peon, NP Karle Plumber,  PharmD   Please be sure to bring in all your medications bottles to every appointment.
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
## 2022-01-20 ENCOUNTER — Other Ambulatory Visit: Payer: Self-pay | Admitting: Family Medicine

## 2022-01-20 DIAGNOSIS — E1165 Type 2 diabetes mellitus with hyperglycemia: Secondary | ICD-10-CM

## 2022-01-21 NOTE — Telephone Encounter (Signed)
Requested medication (s) are due for refill today: Yes  Requested medication (s) are on the active medication list: Yes  Last refill:  10/22/20  Future visit scheduled:   Notes to clinic:  Prescription expired.    Requested Prescriptions  Pending Prescriptions Disp Refills   glipiZIDE (GLUCOTROL) 10 MG tablet [Pharmacy Med Name: GLIPIZIDE 10MG  TABLETS] 180 tablet 4    Sig: TAKE 1 TABLET(10 MG) BY MOUTH TWICE DAILY BEFORE A MEAL     Endocrinology:  Diabetes - Sulfonylureas Failed - 01/20/2022  3:11 AM      Failed - HBA1C is between 0 and 7.9 and within 180 days    Hemoglobin A1C  Date Value Ref Range Status  03/19/2018 >14.0  Final   Hgb A1c MFr Bld  Date Value Ref Range Status  12/10/2021 11.1 (H) 4.8 - 5.6 % Final    Comment:             Prediabetes: 5.7 - 6.4          Diabetes: >6.4          Glycemic control for adults with diabetes: <7.0          Passed - Cr in normal range and within 360 days    Creatinine, Ser  Date Value Ref Range Status  01/16/2022 1.14 0.61 - 1.24 mg/dL Final         Passed - Valid encounter within last 6 months    Recent Outpatient Visits           2 months ago Cellulitis of right lower extremity   [facility] Birdie Sons, MD   3 months ago Uncontrolled type 2 diabetes mellitus with hyperglycemia [facility])   [facility] Birdie Sons, MD   6 months ago Uncontrolled type 2 diabetes mellitus with hyperglycemia [facility])   [facility] Birdie Sons, MD   8 months ago Uncontrolled type 2 diabetes mellitus with hyperglycemia [facility])   [facility] Birdie Sons, MD   11 months ago Uncontrolled type 2 diabetes mellitus with hyperglycemia [facility])   [facility] Mecum, Dani Gobble, PA-C

## 2022-02-04 ENCOUNTER — Other Ambulatory Visit: Payer: Self-pay | Admitting: Family Medicine

## 2022-02-04 DIAGNOSIS — E1165 Type 2 diabetes mellitus with hyperglycemia: Secondary | ICD-10-CM

## 2022-02-04 NOTE — Telephone Encounter (Signed)
Unable to refill per protocol, Rx request is too soon. Last refill 01/21/22.E-Prescribing Status: Receipt confirmed by pharmacy (01/21/2022  3:15 PM EST). Will refuse.  Requested Prescriptions  Pending Prescriptions Disp Refills   glipiZIDE (GLUCOTROL) 10 MG tablet [Pharmacy Med Name: GLIPIZIDE 10MG  TABLETS] 180 tablet 0    Sig: TAKE 1 TABLET(10 MG) BY MOUTH TWICE DAILY BEFORE A MEAL     Endocrinology:  Diabetes - Sulfonylureas Failed - 02/04/2022  8:09 AM      Failed - HBA1C is between 0 and 7.9 and within 180 days    Hemoglobin A1C  Date Value Ref Range Status  03/19/2018 >14.0  Final   Hgb A1c MFr Bld  Date Value Ref Range Status  12/10/2021 11.1 (H) 4.8 - 5.6 % Final    Comment:             Prediabetes: 5.7 - 6.4          Diabetes: >6.4          Glycemic control for adults with diabetes: <7.0          Passed - Cr in normal range and within 360 days    Creatinine, Ser  Date Value Ref Range Status  01/16/2022 1.14 0.61 - 1.24 mg/dL Final         Passed - Valid encounter within last 6 months    Recent Outpatient Visits           2 months ago Cellulitis of right lower extremity   [facility] Birdie Sons, MD   4 months ago Uncontrolled type 2 diabetes mellitus with hyperglycemia [facility])   [facility] Birdie Sons, MD   6 months ago Uncontrolled type 2 diabetes mellitus with hyperglycemia [facility])   [facility] Birdie Sons, MD   8 months ago Uncontrolled type 2 diabetes mellitus with hyperglycemia [facility])   [facility] Birdie Sons, MD   11 months ago Uncontrolled type 2 diabetes mellitus with hyperglycemia [facility])   [place], Dani Gobble, PA-C

## 2022-02-05 ENCOUNTER — Encounter: Payer: Self-pay | Admitting: *Deleted

## 2022-02-05 ENCOUNTER — Telehealth: Payer: Self-pay | Admitting: Family Medicine

## 2022-02-05 DIAGNOSIS — Z006 Encounter for examination for normal comparison and control in clinical research program: Secondary | ICD-10-CM

## 2022-02-05 NOTE — Telephone Encounter (Signed)
Please advise patient there is a sample box of Mounjaro for him ready to pick (is in paper bag with his name on it in the [place]) Also, he needs to schedule office visit in 3-4 weeks to check on A1c.

## 2022-02-05 NOTE — Research (Addendum)
Message left for Mr Curtis [name] to inform him of the Core research study. Encourage him to call back for more information.
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
## 2022-02-17 ENCOUNTER — Ambulatory Visit (HOSPITAL_BASED_OUTPATIENT_CLINIC_OR_DEPARTMENT_OTHER)
Admission: RE | Admit: 2022-02-17 | Discharge: 2022-02-17 | Disposition: A | Discharge status: Home / Self Care | Payer: Self-pay | Source: Ambulatory Visit | Attending: Family Medicine | Admitting: Family Medicine

## 2022-02-17 ENCOUNTER — Ambulatory Visit (HOSPITAL_COMMUNITY)
Admission: RE | Admit: 2022-02-17 | Discharge: 2022-02-17 | Disposition: A | Discharge status: Home / Self Care | Payer: Self-pay | Source: Ambulatory Visit | Attending: Family Medicine | Admitting: Family Medicine

## 2022-02-17 VITALS — BP 140/80 | HR 76 | Wt 177.0 lb

## 2022-02-17 DIAGNOSIS — Z7982 Long term (current) use of aspirin: Secondary | ICD-10-CM | POA: Insufficient documentation

## 2022-02-17 DIAGNOSIS — Z993 Dependence on wheelchair: Secondary | ICD-10-CM

## 2022-02-17 DIAGNOSIS — E785 Hyperlipidemia, unspecified: Secondary | ICD-10-CM | POA: Insufficient documentation

## 2022-02-17 DIAGNOSIS — E1165 Type 2 diabetes mellitus with hyperglycemia: Secondary | ICD-10-CM | POA: Insufficient documentation

## 2022-02-17 DIAGNOSIS — I110 Hypertensive heart disease with heart failure: Secondary | ICD-10-CM | POA: Insufficient documentation

## 2022-02-17 DIAGNOSIS — I2510 Atherosclerotic heart disease of native coronary artery without angina pectoris: Secondary | ICD-10-CM | POA: Insufficient documentation

## 2022-02-17 DIAGNOSIS — I5020 Unspecified systolic (congestive) heart failure: Secondary | ICD-10-CM

## 2022-02-17 DIAGNOSIS — Z79899 Other long term (current) drug therapy: Secondary | ICD-10-CM | POA: Insufficient documentation

## 2022-02-17 DIAGNOSIS — Z951 Presence of aortocoronary bypass graft: Secondary | ICD-10-CM | POA: Insufficient documentation

## 2022-02-17 DIAGNOSIS — Z87891 Personal history of nicotine dependence: Secondary | ICD-10-CM | POA: Insufficient documentation

## 2022-02-17 DIAGNOSIS — E1169 Type 2 diabetes mellitus with other specified complication: Secondary | ICD-10-CM

## 2022-02-17 DIAGNOSIS — I255 Ischemic cardiomyopathy: Secondary | ICD-10-CM | POA: Insufficient documentation

## 2022-02-17 DIAGNOSIS — I5023 Acute on chronic systolic (congestive) heart failure: Secondary | ICD-10-CM | POA: Insufficient documentation

## 2022-02-17 DIAGNOSIS — M7989 Other specified soft tissue disorders: Secondary | ICD-10-CM | POA: Insufficient documentation

## 2022-02-17 DIAGNOSIS — Z7984 Long term (current) use of oral hypoglycemic drugs: Secondary | ICD-10-CM | POA: Insufficient documentation

## 2022-02-17 LAB — ECHOCARDIOGRAM COMPLETE
AR max vel: 3.92 cm2
AV Area VTI: 3.74 cm2
AV Area mean vel: 3.86 cm2
AV Mean grad: 1 mmHg
AV Peak grad: 2.1 mmHg
Ao pk vel: 0.72 m/s
Area-P 1/2: 3.77 cm2
S' Lateral: 3.6 cm

## 2022-02-17 LAB — BASIC METABOLIC PANEL
Anion gap: 8 (ref 5–15)
BUN: 23 mg/dL (ref 8–23)
CO2: 25 mmol/L (ref 22–32)
Calcium: 9.3 mg/dL (ref 8.9–10.3)
Chloride: 103 mmol/L (ref 98–111)
Creatinine, Ser: 1.12 mg/dL (ref 0.61–1.24)
GFR, Estimated: >60 mL/min (ref >60)
Glucose, Bld: 198 mg/dL — ABNORMAL HIGH (ref 70–99)
Potassium: 4 mmol/L (ref 3.5–5.1)
Sodium: 136 mmol/L (ref 135–145)

## 2022-02-17 LAB — BRAIN NATRIURETIC PEPTIDE: B Natriuretic Peptide: 211.2 pg/mL — ABNORMAL HIGH (ref 0.0–100.0)

## 2022-02-17 NOTE — Progress Notes (Signed)
  Echocardiogram 2D Echocardiogram has been performed.  Curtis [name] 02/17/2022, 10:46 AM

## 2022-02-17 NOTE — Patient Instructions (Signed)
Thank you for coming in today  Labs were done today, if any labs are abnormal the clinic will call you No news is good news  Your physician recommends that you schedule a follow-up appointment in:  1 year with Dr. Volney American will receive a reminder letter in the mail a few months in advance. If you don't receive a letter, please call our office to schedule the follow-up appointment.     Do the following things EVERYDAY: Weigh yourself in the morning before breakfast. Write it down and keep it in a log. Take your medicines as prescribed Eat low salt foods--Limit salt (sodium) to 2000 mg per day.  Stay as active as you can everyday Limit all fluids for the day to less than 2 liters   At the [facility], you and your health needs are our priority. As part of our continuing mission to provide you with exceptional heart care, we have created designated Provider Care Teams. These Care Teams include your primary Cardiologist (physician) and Advanced Practice Providers (APPs- Physician Assistants and Nurse Practitioners) who all work together to provide you with the care you need, when you need it.   You may see any of the following providers on your designated Care Team at your next follow up: Dr Glori Bickers Dr Loralie Champagne Dr. Roxana Hires, NP [name], [facility] Susoni [place], [place] Forestine Na, NP Audry Riles, PharmD   Please be sure to bring in all your medications bottles to every appointment.    Thank you for choosing [facility]   If you have any questions or concerns before your next appointment please send [place] a message through [place] or call our office at [phone].    TO LEAVE A MESSAGE FOR THE NURSE SELECT OPTION 2, PLEASE LEAVE A MESSAGE INCLUDING: YOUR NAME DATE OF BIRTH CALL BACK NUMBER REASON FOR CALL**this is important as we prioritize the call backs  YOU WILL  RECEIVE A CALL BACK THE SAME DAY AS LONG AS YOU CALL BEFORE 4:00 PM

## 2022-02-18 ENCOUNTER — Telehealth (HOSPITAL_COMMUNITY): Payer: Self-pay | Admitting: Family Medicine

## 2022-02-18 ENCOUNTER — Encounter (HOSPITAL_COMMUNITY): Payer: Self-pay

## 2022-02-18 NOTE — Telephone Encounter (Signed)
Called patient and left message with recent echo results.  Allena Katz, FNP-BC 02/18/22

## 2022-02-19 ENCOUNTER — Telehealth (HOSPITAL_COMMUNITY): Payer: Self-pay

## 2022-02-19 NOTE — Telephone Encounter (Signed)
Advanced Heart Failure Patient Advocate Encounter  Spoke to patients wife, Vania Rea assistance needs to be renewed for 2024 and patient is in short supply on medication.  Application for Jardiance faxed to [facility] on 02/19/22. Application form attached to patient chart.  Medication Samples have been left at registration desk for patient pick up. Drug name: Jardiance 10MG  Qty: 4x 7 ct packages LOT: [id number] Exp.: 01/2024 SIG: Take 1 tablet by mouth once daily   The patient has been instructed regarding the correct time, dose, and frequency of taking this medication, including desired effects and most common side effects.    Clista Bernhardt, CPhT Rx Patient Advocate Phone: [phone]

## 2022-02-20 ENCOUNTER — Other Ambulatory Visit (HOSPITAL_COMMUNITY): Payer: Self-pay

## 2022-02-20 NOTE — Telephone Encounter (Signed)
Advanced Heart Failure Patient Advocate Encounter  Patient was approved to receive Jardiance from [facility] Effective 02/19/22 to 01/20/23

## 2022-03-03 ENCOUNTER — Other Ambulatory Visit (HOSPITAL_COMMUNITY): Payer: Self-pay

## 2022-03-06 NOTE — Progress Notes (Signed)
Curtis [name],acting as a scribe for Curtis Huh, MD.,have documented all relevant documentation on the behalf of Curtis Huh, MD,as directed by  Curtis Huh, MD while in the presence of Curtis Huh, MD.     Established patient visit   Patient: Curtis [name]   DOB: Jan 11, 1956   67 y.o. Male  MRN: [id number] Visit Date: 03/07/2022  Today's healthcare provider: [name], MD    Subjective    HPI  Diabetes Mellitus Type II, Follow-up  Lab Results  Component Value Date   HGBA1C 11.1 (H) 12/10/2021   HGBA1C 10.1 (A) 10/07/2021   HGBA1C 10.5 (A) 07/24/2021   Wt Readings from Last 3 Encounters:  03/07/22 178 lb (80.7 kg)  02/17/22 177 lb (80.3 kg)  01/16/22 177 lb (80.3 kg)   Last seen for diabetes 3 months ago.  Management since then includes continuation of Mounjaro. He reports good compliance with treatment. He is not having side effects.  Symptoms: No fatigue No foot ulcerations  No appetite changes No nausea  Yes paresthesia of the feet  No polydipsia  Yes polyuria No visual disturbances   No vomiting    Patient has some ulcerations of his legs that need to be checked.  Home blood sugar records:  170-200  Had been much higher when he was out of [facility], but starting coming down quite a bit the last week now seeing some sugars under 100.   Episodes of hypoglycemia? No  Patient is due for diabetic foot exam Most Recent Eye Exam: 09/30/2021 Current diet habits: no special diet  Pertinent Labs: Lab Results  Component Value Date   CHOL 204 (H) 12/10/2021   HDL 11 (L) 12/10/2021   LDLCALC Comment (A) 12/10/2021   LDLDIRECT 16.7 12/05/2020   TRIG 1,700 (HH) 12/10/2021   CHOLHDL 18.5 (H) 12/10/2021   Lab Results  Component Value Date   NA 136 02/17/2022   K 4.0 02/17/2022   CREATININE 1.12 02/17/2022   GFRNONAA >60 02/17/2022      --------------------------------------------------------------------------------------------------- Lipid/Cholesterol, Follow-up  Last lipid panel Other pertinent labs  Lab Results  Component Value Date   CHOL 204 (H) 12/10/2021   HDL 11 (L) 12/10/2021   LDLCALC Comment (A) 12/10/2021   LDLDIRECT 16.7 12/05/2020   TRIG 1,700 (HH) 12/10/2021   CHOLHDL 18.5 (H) 12/10/2021   Lab Results  Component Value Date   ALT 17 10/07/2021   AST 16 10/07/2021   PLT 236 02/15/2021   TSH 1.970 10/07/2021     He was last seen for this 3 months ago.  Management since that visit includes increasing Atorvastatin to 80 mg daily.  He reports good compliance with treatment. He is not having side effects.   Symptoms: No chest pain No chest pressure/discomfort  No dyspnea Yes lower extremity edema  Yes numbness or tingling of extremity No orthopnea  No palpitations No paroxysmal nocturnal dyspnea  No speech difficulty No syncope    The ASCVD Risk score (Arnett DK, et al., 2019) failed to calculate for the following reasons:   The patient has a prior MI or stroke diagnosis  --------------------------------------------------------------------------------------------------- He also reports he hit his left leg on the kitchen counter a few days ago, now has sore on shin and leg is turning red.   Medications: Outpatient Medications Prior to Visit  Medication Sig   acetaminophen (TYLENOL) 500 MG tablet Take 2 tablets (1,000 mg total) by mouth every 6 (six) hours.   aspirin 81 MG chewable tablet Chew  1 tablet (81 mg total) by mouth daily.   atorvastatin (LIPITOR) 80 MG tablet Take 1 tablet (80 mg total) by mouth daily.   empagliflozin (JARDIANCE) 10 MG TABS tablet Take 1 tablet (10 mg total) by mouth daily before breakfast.   famotidine (PEPCID) 40 MG tablet Take 1 tablet (40 mg total) by mouth daily.   furosemide (LASIX) 20 MG tablet Take 2 tablets (40 mg total) by mouth daily.   glipiZIDE  (GLUCOTROL) 10 MG tablet TAKE 1 TABLET(10 MG) BY MOUTH TWICE DAILY BEFORE A MEAL   HYDROcodone-acetaminophen (NORCO/VICODIN) 5-325 MG tablet Take 1 tablet by mouth every 6 (six) hours as needed for moderate pain.   Lancet Devices MISC Use to check blood sugar twice a day   metFORMIN (GLUCOPHAGE-XR) 500 MG 24 hr tablet TAKE 1 TABLET(500 MG) BY MOUTH DAILY   metoprolol succinate (TOPROL-XL) 50 MG 24 hr tablet Take 1 tablet (50 mg total) by mouth daily.   sacubitril-valsartan (ENTRESTO) 97-103 MG TAKE 1 TABLET BY MOUTH 2 (TWO) TIMES DAILY.   spironolactone (ALDACTONE) 25 MG tablet Take 1 tablet (25 mg total) by mouth daily. ON HOLD DUE TO HYPERKALEMIA   tirzepatide (MOUNJARO) 2.5 MG/0.5ML Pen Inject 2.5 mg into the skin once a week.   No facility-administered medications prior to visit.    Review of Systems  Constitutional:  Negative for appetite change, chills and fever.  Respiratory:  Negative for chest tightness, shortness of breath and wheezing.   Cardiovascular:  Negative for chest pain and palpitations.  Gastrointestinal:  Negative for abdominal pain, nausea and vomiting.       Objective    BP 137/66 (BP Location: Left Arm, Patient Position: Sitting, Cuff Size: Normal)   Pulse 72   Temp 98.4 F (36.9 C) (Oral)   Wt 178 lb (80.7 kg)   SpO2 100%   BMI 27.06 kg/m   Physical Exam      Results for orders placed or performed in visit on 03/07/22  POCT glycosylated hemoglobin (Hb A1C)  Result Value Ref Range   Hemoglobin A1C 10.5 (A) 4.0 - 5.6 %   Est. average glucose Bld gHb Est-mCnc 255     Assessment & Plan     1. Uncontrolled type 2 diabetes mellitus with hyperglycemia (HCC) Sugars starting to come down on Mounjaro 2.34m Failed several other medication and is currently relying on samples. Will look into patient assistance again. Anticipate doubling dose if we can get him on assistance of we can get more samples in.   2. Mixed hyperlipidemia Tolerating increased dose of  atorvastatin. Uncontrolled sugar likely major contributor to high triglycerides. Will recheck once sugar gets under better controll. Continue current medications.    3. Cellulitis of left lower extremity - silver sulfADIAZINE (SILVADENE) 1 % cream; Apply 1 Application topically daily.  Dispense: 50 g; Refill: 1 - cephALEXin (KEFLEX) 500 MG capsule; Take 1 capsule (500 mg total) by mouth 4 (four) times daily for 10 days.  Dispense: 40 capsule; Refill: 0   Recheck in 1 week.   4. Chronic pain of right lower extremity Refill  HYDROcodone-acetaminophen (NORCO/VICODIN) 5-325 MG tablet; Take 1 tablet by mouth every 6 (six) hours as needed for moderate pain.  Dispense: 30 tablet; Refill: 0      The entirety of the information documented in the History of Present Illness, Review of Systems and Physical Exam were personally obtained by me. Portions of this information were initially documented by the [place]and reviewed by me for  thoroughness and accuracy.     Curtis Huh, MD  [place] [phone] (phone) [phone] (fax)  [place]

## 2022-03-07 ENCOUNTER — Telehealth: Payer: Self-pay | Admitting: Family Medicine

## 2022-03-07 ENCOUNTER — Ambulatory Visit (INDEPENDENT_AMBULATORY_CARE_PROVIDER_SITE_OTHER): Payer: Self-pay | Admitting: Family Medicine

## 2022-03-07 VITALS — BP 137/66 | HR 72 | Temp 98.4°F | Wt 178.0 lb

## 2022-03-07 DIAGNOSIS — E1165 Type 2 diabetes mellitus with hyperglycemia: Secondary | ICD-10-CM

## 2022-03-07 DIAGNOSIS — L03116 Cellulitis of left lower limb: Secondary | ICD-10-CM

## 2022-03-07 DIAGNOSIS — E782 Mixed hyperlipidemia: Secondary | ICD-10-CM

## 2022-03-07 DIAGNOSIS — G8929 Other chronic pain: Secondary | ICD-10-CM

## 2022-03-07 DIAGNOSIS — M79604 Pain in right leg: Secondary | ICD-10-CM

## 2022-03-07 LAB — POCT GLYCOSYLATED HEMOGLOBIN (HGB A1C)
Est. average glucose Bld gHb Est-mCnc: 255
Hemoglobin A1C: 10.5 % — AB (ref 4.0–5.6)

## 2022-03-07 MED ORDER — SILVER SULFADIAZINE 1 % EX CREA: 1.0000 | TOPICAL_CREAM | Freq: Every day | CUTANEOUS | 1 refills | Status: DC

## 2022-03-07 MED ORDER — HYDROCODONE-ACETAMINOPHEN 5-325 MG PO TABS: 1.0000 | ORAL_TABLET | Freq: Four times a day (QID) | ORAL | 0 refills | Status: DC | PRN

## 2022-03-07 MED ORDER — CEPHALEXIN 500 MG PO CAPS: 500.0000 mg | ORAL_CAPSULE | Freq: Four times a day (QID) | ORAL | 0 refills | Status: DC

## 2022-03-07 NOTE — Telephone Encounter (Signed)
Received a fax from covermymeds for SSD 1% cream  Key:  [id number]

## 2022-03-07 NOTE — Patient Instructions (Signed)
.   Please review the attached list of medications and notify my office if there are any errors.   . Please bring all of your medications to every appointment so we can make sure that our medication list is the same as yours.   

## 2022-03-11 ENCOUNTER — Other Ambulatory Visit (HOSPITAL_COMMUNITY): Payer: Self-pay

## 2022-03-11 MED ORDER — SACUBITRIL-VALSARTAN 97-103 MG PO TABS: 1.0000 | ORAL_TABLET | Freq: Two times a day (BID) | ORAL | 3 refills | Status: DC

## 2022-03-11 MED ORDER — SACUBITRIL-VALSARTAN 97-103 MG PO TABS
1.0000 | ORAL_TABLET | Freq: Two times a day (BID) | ORAL | 3 refills | Status: DC
  Filled 2022-03-11: qty 60, 30d supply, fill #0
  Filled 2022-04-28 – 2022-04-29 (×4): qty 60, 30d supply, fill #1

## 2022-03-12 ENCOUNTER — Other Ambulatory Visit (HOSPITAL_COMMUNITY): Payer: Self-pay

## 2022-03-13 NOTE — Progress Notes (Signed)
[name],acting as a scribe for [name], MD.,have documented all relevant documentation on the behalf of [name], MD,as directed by  [name], MD while in the presence of [name], MD.    Established patient visit   Patient: Curtis [name]   DOB: 1955/10/25   67 y.o. Male  MRN: [id number] Visit Date: 03/14/2022  Today's healthcare provider: [name], MD   No chief complaint on file.  Subjective    HPI  Patient is a 67 year old male who presents for follow up of cellulitis of left lower extremity.  He was last seen on 03/07/22.  He was treated for this with Cephalexin and Silvadene Cream.  Patient states that his wounds are improving.  Medications: Outpatient Medications Prior to Visit  Medication Sig   acetaminophen (TYLENOL) 500 MG tablet Take 2 tablets (1,000 mg total) by mouth every 6 (six) hours.   aspirin 81 MG chewable tablet Chew 1 tablet (81 mg total) by mouth daily.   atorvastatin (LIPITOR) 80 MG tablet Take 1 tablet (80 mg total) by mouth daily.   cephALEXin (KEFLEX) 500 MG capsule Take 1 capsule (500 mg total) by mouth 4 (four) times daily for 10 days.   empagliflozin (JARDIANCE) 10 MG TABS tablet Take 1 tablet (10 mg total) by mouth daily before breakfast.   famotidine (PEPCID) 40 MG tablet Take 1 tablet (40 mg total) by mouth daily.   furosemide (LASIX) 20 MG tablet Take 2 tablets (40 mg total) by mouth daily.   glipiZIDE (GLUCOTROL) 10 MG tablet TAKE 1 TABLET(10 MG) BY MOUTH TWICE DAILY BEFORE A MEAL   HYDROcodone-acetaminophen (NORCO/VICODIN) 5-325 MG tablet Take 1 tablet by mouth every 6 (six) hours as needed for moderate pain.   Lancet Devices MISC Use to check blood sugar twice a day   metFORMIN (GLUCOPHAGE-XR) 500 MG 24 hr tablet TAKE 1 TABLET(500 MG) BY MOUTH DAILY   metoprolol succinate (TOPROL-XL) 50 MG 24 hr tablet Take 1 tablet (50 mg total) by mouth daily.   sacubitril-valsartan (ENTRESTO) 97-103 MG TAKE 1 TABLET BY  MOUTH 2 (TWO) TIMES DAILY.   silver sulfADIAZINE (SILVADENE) 1 % cream Apply 1 Application topically daily.   spironolactone (ALDACTONE) 25 MG tablet Take 1 tablet (25 mg total) by mouth daily. ON HOLD DUE TO HYPERKALEMIA   tirzepatide (MOUNJARO) 2.5 MG/0.5ML Pen Inject 2.5 mg into the skin once a week.   No facility-administered medications prior to visit.    Review of Systems  Constitutional:  Negative for fever.  Skin:  Positive for wound.       Objective    BP (!) 163/78 (BP Location: Right Arm, Patient Position: Sitting, Cuff Size: Normal)   Pulse 71   Temp 98.4 F (36.9 C) (Oral)   SpO2 99%    Physical Exam   Left anterior lower leg     Assessment & Plan     1. Cellulitis of left lower extremity Much improved. Additional 5 days cephALEXin (KEFLEX) 500 MG capsule; Take 1 capsule (500 mg total) by mouth 4 (four) times daily for 5 days.  Dispense: 20 capsule; Refill: 0   Continue silvadene dressing change daily until completely crusted over  Future Appointments  Date Time Provider [place]  06/06/2022 11:00 AM Birdie Sons, MD [facility]        The entirety of the information documented in the History of Present Illness, Review of Systems and Physical Exam were personally obtained by me.  Portions of this information were initially documented by the CMA and reviewed by me for thoroughness and accuracy.     [name], MD  [place] [phone] (phone) [phone] (fax)  [place]

## 2022-03-14 ENCOUNTER — Other Ambulatory Visit (HOSPITAL_COMMUNITY): Payer: Self-pay

## 2022-03-14 ENCOUNTER — Ambulatory Visit (INDEPENDENT_AMBULATORY_CARE_PROVIDER_SITE_OTHER): Payer: Self-pay | Admitting: Family Medicine

## 2022-03-14 ENCOUNTER — Other Ambulatory Visit: Payer: Self-pay

## 2022-03-14 VITALS — BP 163/78 | HR 71 | Temp 98.4°F

## 2022-03-14 DIAGNOSIS — L03116 Cellulitis of left lower limb: Secondary | ICD-10-CM

## 2022-03-14 MED ORDER — CEPHALEXIN 500 MG PO CAPS
500.0000 mg | ORAL_CAPSULE | Freq: Four times a day (QID) | ORAL | 0 refills | Status: DC
  Filled 2022-03-14: qty 20, 5d supply, fill #0

## 2022-03-14 MED ORDER — CEPHALEXIN 500 MG PO CAPS: 500.0000 mg | ORAL_CAPSULE | Freq: Four times a day (QID) | ORAL | 0 refills | Status: AC

## 2022-03-14 NOTE — Telephone Encounter (Signed)
Caller states please send cephALEXin (KEFLEX) 500 MG capsule to        [place] [id number], [place] AT [facility] OF [place] Phone: [phone]  Fax: [phone]    Caller states all medications should go to [facility] unless specified otherwise.

## 2022-03-14 NOTE — Patient Instructions (Signed)
.   Please review the attached list of medications and notify my office if there are any errors.   . Please bring all of your medications to every appointment so we can make sure that our medication list is the same as yours.   

## 2022-03-14 NOTE — Telephone Encounter (Signed)
done 

## 2022-03-17 ENCOUNTER — Other Ambulatory Visit (HOSPITAL_COMMUNITY): Payer: Self-pay

## 2022-03-18 ENCOUNTER — Telehealth: Payer: Self-pay | Admitting: Family Medicine

## 2022-03-18 NOTE — Telephone Encounter (Signed)
Prior Authorization [facility] Key: [id number] [place] Metformin HCI ER 500 MG ER Tablets

## 2022-03-27 ENCOUNTER — Other Ambulatory Visit (HOSPITAL_COMMUNITY): Payer: Self-pay

## 2022-03-31 LAB — HM DIABETES EYE EXAM

## 2022-04-03 ENCOUNTER — Other Ambulatory Visit: Payer: Self-pay

## 2022-04-03 ENCOUNTER — Telehealth: Payer: Self-pay

## 2022-04-03 ENCOUNTER — Other Ambulatory Visit (HOSPITAL_COMMUNITY): Payer: Self-pay

## 2022-04-03 NOTE — Telephone Encounter (Signed)
Copied from [place] [id number]. Topic: General - Inquiry >> Apr 03, 2022  9:28 AM Erskine Squibb wrote: Reason for CRM: The spouse called in to let the provider know the patient is almost out of the samples for [facility] and they cannot afford to pay as he has no insurance. Please let them know when samples are back in.

## 2022-04-04 NOTE — Telephone Encounter (Signed)
We don't have any Mounjaro, but we can take Ozempic instead. There is one pen in the Fridge with his name on it. He is to inject 0.5mg  once a week and sample will last a month.

## 2022-04-04 NOTE — Telephone Encounter (Signed)
Pt spouse advised and verbalized understanding

## 2022-04-28 ENCOUNTER — Encounter (HOSPITAL_COMMUNITY): Payer: Self-pay

## 2022-04-28 ENCOUNTER — Other Ambulatory Visit (HOSPITAL_COMMUNITY): Payer: Self-pay

## 2022-04-29 ENCOUNTER — Other Ambulatory Visit (HOSPITAL_COMMUNITY): Payer: Self-pay

## 2022-04-29 ENCOUNTER — Other Ambulatory Visit: Payer: Self-pay

## 2022-04-29 MED ORDER — SACUBITRIL-VALSARTAN 97-103 MG PO TABS: 1.0000 | ORAL_TABLET | Freq: Two times a day (BID) | ORAL | 3 refills | Status: DC

## 2022-05-01 ENCOUNTER — Other Ambulatory Visit (HOSPITAL_COMMUNITY): Payer: Self-pay

## 2022-05-02 ENCOUNTER — Telehealth: Payer: Self-pay | Admitting: Family Medicine

## 2022-05-02 ENCOUNTER — Ambulatory Visit: Payer: Self-pay | Admitting: *Deleted

## 2022-05-02 NOTE — Telephone Encounter (Signed)
Pt' wife returned call, message from Dr. Sherrie Mustache read, verbalizes understanding. States she will pick up.

## 2022-05-02 NOTE — Telephone Encounter (Signed)
Pt's wife returned call, message from Dr. Sherrie Mustache read, verbalizes understanding.

## 2022-05-02 NOTE — Telephone Encounter (Signed)
Pt's wife Lura Em is calling in because she says Dr. Sherrie Mustache has pt on sample medication for his diabetes and they were instructed to call the office when pt is almost out. Pt will take his last shot this weekend of Ozempic 0.5. [name] says they usually pick the medication up at the office and to call when it can be picked up.

## 2022-05-02 NOTE — Telephone Encounter (Signed)
He can have another sample of Ozempic, I left it in the fridge for him. We still haven't got any more Mounjaro.

## 2022-05-05 ENCOUNTER — Other Ambulatory Visit: Payer: Self-pay | Admitting: Family Medicine

## 2022-05-05 ENCOUNTER — Other Ambulatory Visit (HOSPITAL_COMMUNITY): Payer: Self-pay

## 2022-05-05 ENCOUNTER — Telehealth: Payer: Self-pay | Admitting: Family Medicine

## 2022-05-05 DIAGNOSIS — G8929 Other chronic pain: Secondary | ICD-10-CM

## 2022-05-05 DIAGNOSIS — E1165 Type 2 diabetes mellitus with hyperglycemia: Secondary | ICD-10-CM

## 2022-05-05 NOTE — Telephone Encounter (Signed)
Duplicate request, current request pending.

## 2022-05-05 NOTE — Telephone Encounter (Incomplete)
Medication Refill - Medication: HYDROcodone-acetaminophen (NORCO/VICODIN) 5-325 MG   Has the patient contacted their pharmacy? Yes.   (Agent: If no, request that the patablet tient contact the pharmacy for the refill. If patient does not wish to contact the pharmacy document the reason why and proceed with request.) (Agent: If yes, when and what did the pharmacy advise?)  Preferred Pharmacy (with phone number or street name):  [facility][id number] [place], [place] - [address] AT [facility] OF [address] [facility] Phone: [phone]  Fax: [phone]     Has the patient been seen for an appointment in the last year OR does the patient have an upcoming appointment? 2/13 *** Wife frustrated. Pt is stating that this med has been requested at pharmacy and that the pharmacy states no response.   Agent: Please be advised that RX refills may take up to 3 business days. We ask that you follow-up with your pharmacy.

## 2022-05-05 NOTE — Telephone Encounter (Signed)
Medication Refill - Medication: glipiZIDE (GLUCOTROL) 10 MG tablet and HYDROcodone-acetaminophen (NORCO/VICODIN) 5-325 MG tablet  Has the patient contacted their pharmacy? Yes.   Pt told to contact provider  Preferred Pharmacy (with phone number or street name):  [facility][id number] - [place], [place] - [address] AT [facility] OF [address] [facility] Phone: [phone]  Fax: [phone]     Has the patient been seen for an appointment in the last year OR does the patient have an upcoming appointment? Yes.    Agent: Please be advised that RX refills may take up to 3 business days. We ask that you follow-up with your pharmacy.

## 2022-05-06 MED ORDER — GLIPIZIDE 10 MG PO TABS: ORAL_TABLET | ORAL | 1 refills | Status: DC

## 2022-05-06 MED ORDER — HYDROCODONE-ACETAMINOPHEN 5-325 MG PO TABS: 1.0000 | ORAL_TABLET | Freq: Four times a day (QID) | ORAL | 0 refills | Status: DC | PRN

## 2022-05-06 NOTE — Telephone Encounter (Signed)
Requested medications are due for refill today.  unsure  Requested medications are on the active medications list.  yes  Last refill. 03/07/2022 #30  Future visit scheduled.   yes  Notes to clinic.  Refill not delegated    Requested Prescriptions  Pending Prescriptions Disp Refills   HYDROcodone-acetaminophen (NORCO/VICODIN) 5-325 MG tablet 30 tablet 0    Sig: Take 1 tablet by mouth every 6 (six) hours as needed for moderate pain.     Not Delegated - Analgesics:  Opioid Agonist Combinations Failed - 05/05/2022 12:53 PM      Failed - This refill cannot be delegated      Failed - Urine Drug Screen completed in last 360 days      Passed - Valid encounter within last 3 months    Recent Outpatient Visits           1 month ago Cellulitis of left lower extremity   [place] [facility] Malva Limes, MD   2 months ago Uncontrolled type 2 diabetes mellitus with hyperglycemia (HCC)   [place] [facility] Malva Limes, MD   5 months ago Cellulitis of right lower extremity   [place] [facility] Malva Limes, MD   7 months ago Uncontrolled type 2 diabetes mellitus with hyperglycemia (HCC)   [place] [facility] Malva Limes, MD   9 months ago Uncontrolled type 2 diabetes mellitus with hyperglycemia (HCC)   [place] [facility] Malva Limes, MD       Future Appointments             In 1 month Fisher, Demetrios Isaacs, MD [place] [facility], [facility]            Signed Prescriptions Disp Refills   glipiZIDE (GLUCOTROL) 10 MG tablet 180 tablet 1    Sig: TAKE 1 TABLET(10 MG) BY MOUTH TWICE DAILY BEFORE A MEAL     Endocrinology:  Diabetes - Sulfonylureas Failed - 05/05/2022 12:53 PM      Failed - HBA1C is between 0 and 7.9 and within 180 days    Hemoglobin A1C  Date Value Ref Range Status  03/07/2022 10.5 (A) 4.0 - 5.6 % Final  03/19/2018 >14.0  Final   Hgb A1c  MFr Bld  Date Value Ref Range Status  12/10/2021 11.1 (H) 4.8 - 5.6 % Final    Comment:             Prediabetes: 5.7 - 6.4          Diabetes: >6.4          Glycemic control for adults with diabetes: <7.0          Passed - Cr in normal range and within 360 days    Creatinine, Ser  Date Value Ref Range Status  02/17/2022 1.12 0.61 - 1.24 mg/dL Final         Passed - Valid encounter within last 6 months    Recent Outpatient Visits           1 month ago Cellulitis of left lower extremity   [place] [facility] Malva Limes, MD   2 months ago Uncontrolled type 2 diabetes mellitus with hyperglycemia [facility])   [place] [facility] Malva Limes, MD   5 months ago Cellulitis of right lower extremity   [place] [facility] Malva Limes, MD   7 months ago  Uncontrolled type 2 diabetes mellitus with hyperglycemia [facility])   [place] [facility] Malva Limes, MD   9 months ago Uncontrolled type 2 diabetes mellitus with hyperglycemia [facility])   [place] [facility] Malva Limes, MD       Future Appointments             In 1 month Fisher, Demetrios Isaacs, MD [facility], [facility]

## 2022-05-06 NOTE — Telephone Encounter (Signed)
[facility] requesting prior authorization Key: [id number] Name: [place] Glipizide 10 mg tablets

## 2022-05-06 NOTE — Telephone Encounter (Signed)
Requested Prescriptions  Pending Prescriptions Disp Refills   glipiZIDE (GLUCOTROL) 10 MG tablet 180 tablet 1    Sig: TAKE 1 TABLET(10 MG) BY MOUTH TWICE DAILY BEFORE A MEAL     Endocrinology:  Diabetes - Sulfonylureas Failed - 05/05/2022 12:53 PM      Failed - HBA1C is between 0 and 7.9 and within 180 days    Hemoglobin A1C  Date Value Ref Range Status  03/07/2022 10.5 (A) 4.0 - 5.6 % Final  03/19/2018 >14.0  Final   Hgb A1c MFr Bld  Date Value Ref Range Status  12/10/2021 11.1 (H) 4.8 - 5.6 % Final    Comment:             Prediabetes: 5.7 - 6.4          Diabetes: >6.4          Glycemic control for adults with diabetes: <7.0          Passed - Cr in normal range and within 360 days    Creatinine, Ser  Date Value Ref Range Status  02/17/2022 1.12 0.61 - 1.24 mg/dL Final         Passed - Valid encounter within last 6 months    Recent Outpatient Visits           1 month ago Cellulitis of left lower extremity   [place] [facility] Malva Limes, MD   2 months ago Uncontrolled type 2 diabetes mellitus with hyperglycemia [facility])   [place] [facility] Malva Limes, MD   5 months ago Cellulitis of right lower extremity   [place] [facility] Malva Limes, MD   7 months ago Uncontrolled type 2 diabetes mellitus with hyperglycemia [facility])   [place] [facility] Malva Limes, MD   9 months ago Uncontrolled type 2 diabetes mellitus with hyperglycemia [facility])   [place] [facility] Malva Limes, MD       Future Appointments             In 1 month Fisher, Demetrios Isaacs, MD [place] [facility], [facility]             HYDROcodone-acetaminophen (NORCO/VICODIN) 5-325 MG tablet 30 tablet 0    Sig: Take 1 tablet by mouth every 6 (six) hours as needed for moderate pain.     Not Delegated - Analgesics:  Opioid Agonist Combinations Failed - 05/05/2022 12:53 PM       Failed - This refill cannot be delegated      Failed - Urine Drug Screen completed in last 360 days      Passed - Valid encounter within last 3 months    Recent Outpatient Visits           1 month ago Cellulitis of left lower extremity   [place] [facility] Malva Limes, MD   2 months ago Uncontrolled type 2 diabetes mellitus with hyperglycemia [facility])   [place] [facility] Malva Limes, MD   5 months ago Cellulitis of right lower extremity   [place] [facility] Malva Limes, MD   7 months ago Uncontrolled type 2 diabetes mellitus with hyperglycemia [facility])   [place] [facility] Malva Limes, MD   9 months ago Uncontrolled type 2 diabetes mellitus with hyperglycemia [facility])    [place] [facility] Sherrie Mustache, Demetrios Isaacs, MD  Future Appointments             In 1 month Fisher, Demetrios Isaacs, MD [facility], [facility]

## 2022-05-09 ENCOUNTER — Telehealth: Payer: Self-pay | Admitting: Family Medicine

## 2022-05-09 NOTE — Telephone Encounter (Signed)
[facility] requesting prior authorization Key: [id number] Name: [place] Glipizide 10 mg tablet

## 2022-05-12 ENCOUNTER — Other Ambulatory Visit: Payer: Self-pay

## 2022-05-12 ENCOUNTER — Telehealth: Payer: Self-pay | Admitting: Family Medicine

## 2022-05-12 DIAGNOSIS — E1165 Type 2 diabetes mellitus with hyperglycemia: Secondary | ICD-10-CM

## 2022-05-12 MED ORDER — GLIPIZIDE 10 MG PO TABS: ORAL_TABLET | ORAL | 1 refills | Status: DC

## 2022-05-12 NOTE — Telephone Encounter (Signed)
[facility] pharmacy faxed refill request for the following medications:    glipiZIDE (GLUCOTROL) 10 MG tablet   Please advise

## 2022-05-15 ENCOUNTER — Telehealth (HOSPITAL_COMMUNITY): Payer: Self-pay

## 2022-05-15 NOTE — Telephone Encounter (Signed)
Advanced Heart Failure Patient Advocate Encounter  Spoke to pt spouse regarding Entresto assistance due for renewal. Will complete application and submit once signatures are obtained.  Burnell Blanks, CPhT Rx Patient Advocate Phone: [phone]

## 2022-05-16 NOTE — Telephone Encounter (Signed)
Advanced Heart Failure Patient Advocate Encounter  Application for Entresto faxed to [employer] on 05/16/2022. Application form attached to patient chart.

## 2022-05-16 NOTE — Telephone Encounter (Signed)
Advanced Heart Failure Patient Advocate Encounter  [employer] is requesting POI before making a determination. Patient or spouse will bring records of income to [facility] office Monday 04/29

## 2022-05-18 ENCOUNTER — Other Ambulatory Visit: Payer: Self-pay | Admitting: Family Medicine

## 2022-05-18 DIAGNOSIS — E782 Mixed hyperlipidemia: Secondary | ICD-10-CM

## 2022-05-19 NOTE — Telephone Encounter (Signed)
Advanced Heart Failure Patient Advocate Encounter  POI faxed to [employer] on 05/19/22

## 2022-05-23 NOTE — Telephone Encounter (Signed)
Advanced Heart Failure Patient Nurse, children's for update, representative was able to clarify that POI was received for both members of the household and will have the claim reviewed. Will follow for updates.

## 2022-05-28 ENCOUNTER — Telehealth: Payer: Self-pay

## 2022-05-28 NOTE — Telephone Encounter (Signed)
Copied from CRM [id number]. Topic: General - Other >> May 27, 2022 10:39 AM Franchot Heidelberg wrote: Reason for CRM: Pt called for more samples of ozempic  Best contact: [phone]

## 2022-05-29 ENCOUNTER — Other Ambulatory Visit (HOSPITAL_COMMUNITY): Payer: Self-pay

## 2022-06-02 ENCOUNTER — Telehealth: Payer: Self-pay

## 2022-06-02 NOTE — Telephone Encounter (Signed)
Copied from CRM [id number]. Topic: General - Other >> Jun 02, 2022  3:09 PM Everette C wrote: Reason for CRM: The patient's wife has called to follow up on a previous request for samples of ozempic from 05/28/22  Please contact further when possible

## 2022-06-02 NOTE — Telephone Encounter (Signed)
There is a sample pen of Ozempic in the fridge with his name on it.

## 2022-06-02 NOTE — Telephone Encounter (Signed)
Left detailed voicemail

## 2022-06-02 NOTE — Telephone Encounter (Signed)
Advanced Heart Failure Patient Nurse, children's for status update. Application is still under review. Representative stated that someone has been assigned to review the case and they will follow up. Should expect an update within 24-48 hours. Will call back Wednesday if no determination letter has been received.

## 2022-06-05 NOTE — Progress Notes (Signed)
I,Joseline E Rosas,acting as a scribe for Mila Merry, MD.,have documented all relevant documentation on the behalf of Mila Merry, MD,as directed by  Mila Merry, MD while in the presence of Mila Merry, MD.   Established patient visit   Patient: Curtis [name]   DOB: [birth year]   67 y.o. Male  MRN: [id number] Visit Date: 06/06/2022  Today's healthcare provider: Mila Merry, MD   Chief Complaint  Patient presents with   Follow-up   Subjective    HPI  Diabetes Mellitus Type II, follow-up  Lab Results  Component Value Date   HGBA1C 9.1 (A) 06/06/2022   HGBA1C 10.5 (A) 03/07/2022   HGBA1C 11.1 (H) 12/10/2021   Last seen for diabetes 3 months ago.  Management since then includes continuing the same treatment. Patient currently on Ozempic 0.5 mg samples. He reports excellent compliance with treatment. He is not having side effects.   Home blood sugar records: fasting range: 170. Reports that this Am was 250  Episodes of hypoglycemia? No    Current insulin regiment: none Most Recent Eye Exam: Yesterday, [facility]  --------------------------------------------------------------------------------------------------- Lipid/Cholesterol, follow-up  Last Lipid Panel: Lab Results  Component Value Date   CHOL 204 (H) 12/10/2021   LDLCALC Comment (A) 12/10/2021   LDLDIRECT 16.7 12/05/2020   HDL 11 (L) 12/10/2021   TRIG 1,700 (HH) 12/10/2021    He was last seen for this 3 months ago.  Management since that visit includes continue current medication.  He reports excellent compliance with treatment. He is not having side effects.   Symptoms: Sores in legs. No appetite changes No foot ulcerations  No chest pain No chest pressure/discomfort  No dyspnea No orthopnea  Yes fatigue No lower extremity edema  No palpitations No paroxysmal nocturnal dyspnea  Yes nausea Yes numbness or tingling of extremity  No polydipsia Yes polyuria  No speech difficulty No syncope    Last metabolic panel Lab Results  Component Value Date   GLUCOSE 198 (H) 02/17/2022   NA 136 02/17/2022   K 4.0 02/17/2022   BUN 23 02/17/2022   CREATININE 1.12 02/17/2022   EGFR 76 12/10/2021   GFRNONAA >60 02/17/2022   CALCIUM 9.3 02/17/2022   AST 16 10/07/2021   ALT 17 10/07/2021   The ASCVD Risk score (Arnett DK, et al., 2019) failed to calculate for the following reasons:   The valid HDL cholesterol range is 20 to 100 mg/dL  ---------------------------------------------------------------------------------------------------   Medications: Outpatient Medications Prior to Visit  Medication Sig   acetaminophen (TYLENOL) 500 MG tablet Take 2 tablets (1,000 mg total) by mouth every 6 (six) hours.   aspirin 81 MG chewable tablet Chew 1 tablet (81 mg total) by mouth daily.   atorvastatin (LIPITOR) 80 MG tablet TAKE 1 TABLET(80 MG) BY MOUTH DAILY   empagliflozin (JARDIANCE) 10 MG TABS tablet Take 1 tablet (10 mg total) by mouth daily before breakfast.   famotidine (PEPCID) 40 MG tablet Take 1 tablet (40 mg total) by mouth daily.   furosemide (LASIX) 20 MG tablet Take 2 tablets (40 mg total) by mouth daily.   glipiZIDE (GLUCOTROL) 10 MG tablet TAKE 1 TABLET(10 MG) BY MOUTH TWICE DAILY BEFORE A MEAL   HYDROcodone-acetaminophen (NORCO/VICODIN) 5-325 MG tablet Take 1 tablet by mouth every 6 (six) hours as needed for moderate pain.   Lancet Devices MISC Use to check blood sugar twice a day   metFORMIN (GLUCOPHAGE-XR) 500 MG 24 hr tablet TAKE 1 TABLET(500 MG) BY MOUTH DAILY  metoprolol succinate (TOPROL-XL) 50 MG 24 hr tablet Take 1 tablet (50 mg total) by mouth daily.   sacubitril-valsartan (ENTRESTO) 97-103 MG TAKE 1 TABLET BY MOUTH 2 (TWO) TIMES DAILY.   silver sulfADIAZINE (SILVADENE) 1 % cream Apply 1 Application topically daily.   spironolactone (ALDACTONE) 25 MG tablet Take 1 tablet (25 mg total) by mouth daily. ON HOLD DUE TO HYPERKALEMIA   Semaglutide (Ozempic) Inject 0.5 mg  into the skin once a week.   No facility-administered medications prior to visit.    Review of Systems  Constitutional:  Negative for appetite change, chills and fever.  Respiratory:  Negative for chest tightness, shortness of breath and wheezing.   Cardiovascular:  Negative for chest pain and palpitations.  Gastrointestinal:  Negative for abdominal pain, nausea and vomiting.       Objective    BP (!) 144/81 (BP Location: Right Arm, Patient Position: Sitting, Cuff Size: Normal)   Pulse 79   Resp 16   Wt 179 lb (81.2 kg)   SpO2 99%   BMI 27.22 kg/m    Physical Exam    Results for orders placed or performed in visit on 06/06/22  POCT glycosylated hemoglobin (Hb A1C)  Result Value Ref Range   Hemoglobin A1C 9.1 (A) 4.0 - 5.6 %   Est. average glucose Bld gHb Est-mCnc 214     Assessment & Plan     1. Uncontrolled type 2 diabetes mellitus with hyperglycemia (HCC) Improving since starting Ozempic which he is tolerating well. Given another sample pen today for 4 additional 0.5mg  doses. Will check see if we can 1mg  through his patient assistance.   - Urine microalbumin-creatinine with uACR - Lipid panel      The entirety of the information documented in the History of Present Illness, Review of Systems and Physical Exam were personally obtained by me. Portions of this information were initially documented by the CMA and reviewed by me for thoroughness and accuracy.     Mila Merry, MD  [facility] [phone] (phone) [phone] (fax)  [facility]

## 2022-06-05 NOTE — Telephone Encounter (Signed)
Advanced Heart Failure Patient Advocate Encounter  Spoke with [employer], representative stated that the application is still under review, she has "marked this as an active task for 5/17" and advised me to call again if no determination letter is received within 24 hours.

## 2022-06-06 ENCOUNTER — Ambulatory Visit (INDEPENDENT_AMBULATORY_CARE_PROVIDER_SITE_OTHER): Payer: Self-pay | Admitting: Family Medicine

## 2022-06-06 ENCOUNTER — Other Ambulatory Visit (HOSPITAL_COMMUNITY): Payer: Self-pay

## 2022-06-06 VITALS — BP 144/81 | HR 79 | Resp 16 | Wt 179.0 lb

## 2022-06-06 DIAGNOSIS — E1165 Type 2 diabetes mellitus with hyperglycemia: Secondary | ICD-10-CM

## 2022-06-06 DIAGNOSIS — E11319 Type 2 diabetes mellitus with unspecified diabetic retinopathy without macular edema: Secondary | ICD-10-CM

## 2022-06-06 LAB — POCT GLYCOSYLATED HEMOGLOBIN (HGB A1C)
Est. average glucose Bld gHb Est-mCnc: 214
Hemoglobin A1C: 9.1 % — AB (ref 4.0–5.6)

## 2022-06-07 ENCOUNTER — Other Ambulatory Visit: Payer: Self-pay | Admitting: Family Medicine

## 2022-06-07 DIAGNOSIS — E1165 Type 2 diabetes mellitus with hyperglycemia: Secondary | ICD-10-CM

## 2022-06-07 LAB — LIPID PANEL
Chol/HDL Ratio: 8.3 ratio — ABNORMAL HIGH (ref 0.0–5.0)
Cholesterol, Total: 175 mg/dL (ref 100–199)
HDL: 21 mg/dL — ABNORMAL LOW (ref >39)
Triglycerides: 881 mg/dL (ref 0–149)

## 2022-06-07 LAB — MICROALBUMIN / CREATININE URINE RATIO
Creatinine, Urine: 50.6 mg/dL
Microalb/Creat Ratio: 5272 mg/g creat — ABNORMAL HIGH (ref 0–29)
Microalbumin, Urine: 2667.6 ug/mL

## 2022-06-08 NOTE — Addendum Note (Signed)
Addended by: Malva Limes on: 06/08/2022 08:34 AM   Modules accepted: Orders

## 2022-06-09 ENCOUNTER — Telehealth: Payer: Self-pay

## 2022-06-09 MED ORDER — AZITHROMYCIN 250 MG PO TABS: ORAL_TABLET | ORAL | 0 refills | Status: AC

## 2022-06-09 NOTE — Telephone Encounter (Signed)
Advanced Heart Failure Patient Advocate Encounter  Spoke with patient's wife by phone. She will bring POI for 3 mos to AHF and drop off. Providing samples for patient until this application can be resolved.  Medication Samples have been left at registration desk for patient pick up. Drug name: Sherryll Burger 49-51MG  Qty: 4x 28 ct packages LOT: [id number], [id number] Exp.: 11/25, 06/25 SIG: Take 2 tablets by mouth twice daily   The patient has been instructed regarding the correct time, dose, and frequency of taking this medication, including desired effects and most common side effects.   *She is aware that he will need to take 2 tablets twice daily to mimic pts current therapy**

## 2022-06-09 NOTE — Progress Notes (Signed)
   Care Guide Note  06/09/2022 Name: AFTAB NESSETH MRN: [id number] DOB: 09-17-1955  Referred by: Malva Limes, MD Reason for referral : Care Coordination (Outreach to schedule with Pharm d )   NIEKO HERSKOVITZ is a 67 y.o. year old male who is a primary care patient of Sherrie Mustache, Demetrios Isaacs, MD. Cherre Robins was referred to the pharmacist for assistance related to DM.    Successful contact was made with the patient to discuss pharmacy services including being ready for the pharmacist to call at least 5 minutes before the scheduled appointment time, to have medication bottles and any blood sugar or blood pressure readings ready for review. The patient agreed to meet with the pharmacist via with the pharmacist via telephone visit on (date/time).  07/09/2022  Penne Lash, RMA Care Guide [facility]  [place], [place] [address] Direct Dial: [phone] [email or web address]

## 2022-06-09 NOTE — Telephone Encounter (Signed)
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 Today's representative stated that they cannot process the income information, as 3 pay stubs were submitted, not 3 months worth of income. Unable to submit 1040, as this patient is exempt from filing. Spoke to pt by phone, his wife will connect with me about providing 3 months of income and/or if samples are needed.

## 2022-06-09 NOTE — Addendum Note (Signed)
Addended by: Malva Limes on: 06/09/2022 12:50 PM   Modules accepted: Orders

## 2022-06-10 ENCOUNTER — Telehealth: Payer: Self-pay

## 2022-06-10 NOTE — Telephone Encounter (Signed)
Copied from CRM [id number]. Topic: General - Other >> Jun 10, 2022  3:00 PM Ja-Kwan M wrote: Reason for CRM: Pt wife stated the pt received a call yesterday and he was told that a Rx for a cholesterol medication would be sent to the pharmacy but the pharmacy informed her that no Rx for cholesterol medication was received. Pt wife requests that her call be returned at [phone]

## 2022-06-10 NOTE — Telephone Encounter (Signed)
The medication is Vascepa. Have sent referral to the pharmacy team is going to try to get this through patient assistance.

## 2022-06-10 NOTE — Telephone Encounter (Signed)
Patients wife advised and verbalized understanding.  

## 2022-06-12 NOTE — Telephone Encounter (Addendum)
Advanced Heart Failure Patient Advocate Encounter  Patient was approved to receive Entresto from [employer] Effective 06/12/2022 to 06/12/2023  Determination letter has been added to patient chart. Informed Curtis [name] by phone.

## 2022-06-12 NOTE — Telephone Encounter (Signed)
Advanced Heart Failure Patient Advocate Encounter  Proof of income x3 months faxed to [employer] on 05/23

## 2022-06-25 ENCOUNTER — Other Ambulatory Visit: Payer: Self-pay | Admitting: Pharmacist

## 2022-06-25 ENCOUNTER — Other Ambulatory Visit (HOSPITAL_COMMUNITY): Payer: Self-pay

## 2022-06-25 ENCOUNTER — Other Ambulatory Visit: Payer: Self-pay

## 2022-06-25 ENCOUNTER — Telehealth: Payer: Self-pay | Admitting: Family Medicine

## 2022-06-25 NOTE — Telephone Encounter (Signed)
Please advise patient there are 2 sample pens of Ozempic reserved for him in the refrigerator.

## 2022-06-25 NOTE — Progress Notes (Signed)
06/25/2022 Name: JOSERAMON ZUBA MRN: [id number] DOB: Feb 12, 1955  Chief Complaint  Patient presents with   Medication Assistance    UDELL RHUE is a 67 y.o. year old male who was referred to the pharmacist by their PCP for assistance in managing medication access.   Per referral note from PCP. Patient has been on samples of of Ozempic 0.5mg /week, but need to go up to 1mg   Would also like to add Vascepa 2,000 mg twice a day due to CAD and hypertriglyceridemia.   Speak with patient's spouse, Aquarius Geissler (listed on DPR in chart), today.  Subjective:  Care Team: Primary Care Provider: Malva Limes, MD Cardiologist: Dolores Patty, MD  Medication Access/Adherence  Current Pharmacy:  [facility] [phone] Che[place] Ditto, [place] - [address] AT [facility] [address] [place] [address] [place] [address] Phone: [phone] Fax: [phone]  [place] [facility] [address] [place] [address] Phone: [phone] Fax: [phone]  [facility] [place]ie [place], [place] - [address] [place] [address] Phone: [phone] Fax: [phone]   Patient reports affordability concerns with their medications: Yes  Patient reports access/transportation concerns to their pharmacy: No  Patient reports adherence concerns with their medications:  No     Reports patient is uninsured. Note age >61, but reports patient does not currently have any Medicare coverage. Reports has had Workers Comp since 2009.  Reports uses weekly pillboxes to manage patient's medications; denies missed doses  Based on reported income, patient does not currently meet criteria for Extra Help subsidy through Social Security  Based on reported income and uninsured status, patient meets criteria for patient assistance for [employer] from [employer], there is not currently a patient assistance  program for [employer] - Based on uninsured status, patient would not currently meet criteria for disease state funds such as [employer] for support with affording this medication  Diabetes:  Current medications:  - glipizide 10 mg twice daily before meals - metformin ER 500 mg daily - Ozempic 0.5 mg weekly - Jardiance 10 mg daily before breakfast  Current glucose readings: reports recent morning fasting readings ranging 160-240   Denies hypoglycemic s/sx including dizziness, shakiness, sweating.   Current meal patterns:  - Breakfast: boiled egg, slice of tomato and sliced banana with honey and glass of milk or oatmeal with piece toast - Lunch: often skips or eats pickles beets or walnuts - Supper: soup or chicken or fish and greens - Snacks: wheat crackers - Drinks: 2% milk, diet Coke, sugar free tea  Current physical activity: limited due to disability (in wheelchair) - limited to transferring   Current medication access support: Enrolled in [employer] Patient assistance program for Jardiance through 01/20/2023 ([facility] supported with application)   Hyperlipidemia/ASCVD Risk Reduction  Current lipid lowering medications: atorvastatin 80 mg daily Medications tried in the past:   Antiplatelet regimen: aspirin 81 mg  Current physical activity: limited due to disability (in wheelchair) - limited to transferring   Current medication access support: none   Heart Failure:  Current medications:  ACEi/ARB/ARNI: Entresto 97-103 mg twice daily SGLT2i: Jardiance 10 mg daily Beta blocker: metoprolol ER 50 mg daily Mineralocorticoid Receptor Antagonist: spironolactone 25 mg daily Diuretic regimen: furosemide 20 mg - 2 tablets (40 mg) daily  Denies monitoring home blood pressure; denies having home BP monitor  Denies dizziness or lightheadedness  Reports limiting salt/sodium in diet. Reviews nutrition labels for sodium content of foods.  Current  medication access support:  - Enrolled in [employer] patient assistance program for Jardiance through 01/20/2023 ([facility] Failure [facility] supports with application) - Enrolled in [employer] patient assistance program for Entresto (Advanced Heart Failure Clinic supports with application)  Objective:  Lab Results  Component Value Date   HGBA1C 9.1 (A) 06/06/2022    Lab Results  Component Value Date   CREATININE 1.12 02/17/2022   BUN 23 02/17/2022   NA 136 02/17/2022   K 4.0 02/17/2022   CL 103 02/17/2022   CO2 25 02/17/2022    Lab Results  Component Value Date   CHOL 175 06/06/2022   HDL 21 (L) 06/06/2022   LDLCALC Comment (A) 06/06/2022   LDLDIRECT 16.7 12/05/2020   TRIG 881 (HH) 06/06/2022   CHOLHDL 8.3 (H) 06/06/2022   BP Readings from Last 3 Encounters:  06/06/22 (!) 144/81  03/14/22 (!) 163/78  03/07/22 137/66   Pulse Readings from Last 3 Encounters:  06/06/22 79  03/14/22 71  03/07/22 72    Medications Reviewed Today     Reviewed by Manuela Neptune, RPH-CPP (Pharmacist) on 06/25/22 at 1247  Med List Status: <None>   Medication Order Taking? Sig Documenting Provider Last Dose Status Informant  acetaminophen (TYLENOL) 500 MG tablet [id number] Yes Take 2 tablets (1,000 mg total) by mouth every 6 (six) hours. Sharlene Dory, [place] Taking Active   aspirin 81 MG chewable tablet [id number] Yes Chew 1 tablet (81 mg total) by mouth daily. Robbie Lis M, PA-C Taking Active   atorvastatin (LIPITOR) 80 MG tablet [id number] Yes TAKE 1 TABLET(80 MG) BY MOUTH DAILY Malva Limes, MD Taking Active   empagliflozin (JARDIANCE) 10 MG TABS tablet [id number] Yes Take 1 tablet (10 mg total) by mouth daily before breakfast. Bensimhon, Bevelyn Buckles, MD Taking Active   famotidine (PEPCID) 40 MG tablet [id number] Yes Take 1 tablet (40 mg total) by mouth daily. Malva Limes, MD Taking Active   furosemide (LASIX) 20 MG tablet [id number] Yes Take 2 tablets (40 mg total) by mouth  daily. Bensimhon, Bevelyn Buckles, MD Taking Active   glipiZIDE (GLUCOTROL) 10 MG tablet [id number] Yes TAKE 1 TABLET(10 MG) BY MOUTH TWICE DAILY BEFORE A MEAL Fisher, Demetrios Isaacs, MD Taking Active   HYDROcodone-acetaminophen (NORCO/VICODIN) 5-325 MG tablet [id number] Yes Take 1 tablet by mouth every 6 (six) hours as needed for moderate pain. Malva Limes, MD Taking Active   Lancet Devices MISC [id number]  Use to check blood sugar twice a day Malva Limes, MD  Active Self  metFORMIN (GLUCOPHAGE-XR) 500 MG 24 hr tablet [id number] Yes TAKE 1 TABLET(500 MG) BY MOUTH DAILY Malva Limes, MD Taking Active   metoprolol succinate (TOPROL-XL) 50 MG 24 hr tablet [id number] Yes Take 1 tablet (50 mg total) by mouth daily. Bensimhon, Bevelyn Buckles, MD Taking Active   sacubitril-valsartan (ENTRESTO) 97-103 MG [id number] Yes TAKE 1 TABLET BY MOUTH 2 (TWO) TIMES DAILY. Fromberg, Anderson Malta, FNP Taking Active   Semaglutide,0.25 or 0.5MG /DOS, (OZEMPIC, 0.25 OR 0.5 MG/DOSE,) 2 MG/3ML SOPN [id number] Yes Inject 0.5 mg into the skin once a week. [provider] Taking Active   silver sulfADIAZINE (SILVADENE) 1 % cream [id number]  Apply 1 Application topically daily. Malva Limes, MD  Active   spironolactone (ALDACTONE) 25 MG tablet [id number] No Take 1 tablet (25 mg total) by mouth daily. ON HOLD DUE  TO HYPERKALEMIA  Patient not taking: Reported on 06/25/2022   Malva Limes, MD Not Taking Active               Assessment/Plan:   Recommend patient reach out to the Medication Management Clinic at [facility] for enrollment in the [facility] medication assistance program - Based on reported income and uninsured status, patient meets criteria for this program - Will send referral message to Patient Advocate Specialist Bernadene Person  Recommend patient reach out to the Medicare and Seniors' Health Insurance Information Program [facility]) to schedule an appointment with a counselor for help with enrolling in a Medicare  plan in the future - Phone number provided  Will send message to collaborate with LCSW at Advanced Heart Failure Clinic   Diabetes: - Currently uncontrolled - Reviewed long term cardiovascular and renal outcomes of uncontrolled blood sugar - Reviewed goal A1c, goal fasting, and goal 2 hour post prandial glucose - Reviewed dietary modifications including importance of having regular well-balanced meals and snacks while controlling carbohydrate portion sizes  Encourage to review nutrition labels for total carbohydrate content of foods  Will send patient handout on healthy meal planning via MyChart as requested - Recommend to continue to check glucose, keep a log of the results and have this record to share with providers at upcoming medical appointments - Meets financial criteria for Ozempic patient assistance program through [employer]. Will collaborate with provider, [facility] CPhT, and patient to pursue assistance.  Note provider/patient interested in increase of Ozempic dose to 1 mg weekly strength with enrollment in program   Hyperlipidemia/ASCVD Risk Reduction: - Currently uncontrolled.  - Reviewed long term complications of uncontrolled cholesterol - Will plan to collaborate with Medication Management Clinic regarding options for medication access, including whether patient can receive Vascepa through this program  Heart Failure: - Counsel on the benefits of home blood pressure monitoring for patient; patient does not have home blood pressure monitor at this time; denies interest in monitoring at this time   Follow Up Plan: Clinical Pharmacist will follow up with patient by telephone on 07/23/2022 at 2:30pm  Estelle Grumbles, PharmD, [facility] [phone]

## 2022-06-25 NOTE — Telephone Encounter (Signed)
Advised 

## 2022-06-26 ENCOUNTER — Encounter: Payer: Self-pay | Admitting: Pharmacist

## 2022-06-27 ENCOUNTER — Telehealth: Payer: Self-pay

## 2022-06-27 ENCOUNTER — Other Ambulatory Visit (HOSPITAL_COMMUNITY): Payer: Self-pay

## 2022-06-27 ENCOUNTER — Other Ambulatory Visit: Payer: Self-pay

## 2022-06-27 NOTE — Patient Instructions (Signed)
Goals Addressed             This Visit's Progress    Pharmacy Goals       Our goal A1c is less than 7%. This corresponds with fasting sugars less than 130 and 2 hour after meal sugars less than 180. Please keep a log of your results when checking your blood sugar   Our goal bad cholesterol, or LDL, is less than 70. This is why it is important to continue taking your atorvastatin.   The following is the healthy meal planning handout that we discussed. Please copy and paste the web address below into your web browser to view:   [email or web address]     Also, you can find more information about the Medicare and Seniors' [employer] Information Program [facility]) here:   [email or web address]   You can reach the [facility] Counselor for [facility] by calling:   [facility] [place] [address]     [place]  [address] [phone]     Ask for help with [facility]       You can find more information about the Medication Management Clinic here:   [email or web address]   Please reach out to the Medication Management Clinic by calling   [facility] [address] [place], [place] [address] [phone]   Thank you!   Estelle Grumbles, PharmD, [facility] [phone]

## 2022-06-27 NOTE — Telephone Encounter (Signed)
Application for Ozempic 1mg  weekly has been submitted online to [employer]

## 2022-06-27 NOTE — Telephone Encounter (Signed)
Pt shows active commercial active coverage he needs a PA

## 2022-06-27 NOTE — Telephone Encounter (Signed)
-----   Message from Manuela Neptune, RPH-CPP sent at 06/26/2022 11:38 AM EDT ----- Would you please assist this patient with applying for PAP for Ozempic 1 mg weekly from [employer]? Prescriber: PCP - Dr. Sherrie Mustache  Patient is currently uninsured  Please note, Dr. Sherrie Mustache would like to increase patient to Ozempic 1 mg weekly dose with enrollment in PAP.  Thank you!  Gentry Fitz

## 2022-06-30 ENCOUNTER — Other Ambulatory Visit (HOSPITAL_COMMUNITY): Payer: Self-pay

## 2022-06-30 ENCOUNTER — Other Ambulatory Visit (HOSPITAL_COMMUNITY): Payer: Self-pay | Admitting: Internal Medicine

## 2022-06-30 DIAGNOSIS — I5020 Unspecified systolic (congestive) heart failure: Secondary | ICD-10-CM

## 2022-06-30 MED ORDER — METOPROLOL SUCCINATE ER 50 MG PO TB24
50.0000 mg | ORAL_TABLET | Freq: Every day | ORAL | 3 refills | Status: DC
  Filled 2022-06-30: qty 90, 90d supply, fill #0
  Filled 2022-10-10: qty 90, 90d supply, fill #1
  Filled 2023-01-09: qty 90, 90d supply, fill #2
  Filled 2023-01-09: qty 30, 30d supply, fill #2
  Filled 2023-02-10: qty 30, 30d supply, fill #3
  Filled 2023-03-10: qty 30, 30d supply, fill #4
  Filled 2023-04-13: qty 30, 30d supply, fill #5
  Filled 2023-05-18: qty 30, 30d supply, fill #6
  Filled 2023-06-17: qty 30, 30d supply, fill #7

## 2022-07-08 ENCOUNTER — Other Ambulatory Visit (HOSPITAL_COMMUNITY): Payer: Self-pay

## 2022-07-08 NOTE — Telephone Encounter (Signed)
Received notification from [employer] regarding patient assistance DENIAL for OZEMPIC.   Due to they [employer] ran a Theatre stage manager and he comes up to be insured  with a Nurse, learning disability, so with that, if this is a workers Visual merchandiser he would need to get a letter on letter head from company stating that he does S NOT HAVE OVER ALL COVERAGE AND RESUBMIT APP WITH THE LETTER TO REPROCESS    SCANNING LETTER IN MEDIA OF CHART   PLEASE BE ADVISED  Melanee Spry CPhT [facility] [phone]

## 2022-07-09 ENCOUNTER — Other Ambulatory Visit: Payer: Self-pay | Admitting: Pharmacist

## 2022-07-09 NOTE — Progress Notes (Signed)
   07/09/2022  Patient ID: Curtis [name], male   DOB: 09/01/1955, 67 y.o.   MRN: [id number]  Receive message from CPhT Linward Foster advising received notification back from [employer] patient assistance stating patient denied for program as insurance check is showing patient to have commercial coverage. 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  Outreach to patient's spouse today to provide this update. Spouse states that patient also received a letter from [employer] and was preparing to fax patient's documentation back to CPhT, but has questions. Provide spouse with contact information for CPhT Linward Foster [phone]).  Again recommend patient reach out to the Medication Management Clinic at [facility] for enrollment in the [facility] medication assistance program - Based on reported income and uninsured status, patient meets criteria for this program   Again recommend patient reach out to the [employer] and Seniors' [employer] Information Program [place]) to schedule an appointment with a counselor for help with enrolling in a Medicare plan in the future - Phone number provided  Follow Up Plan: Clinical Pharmacist will follow up with patient by telephone on 07/23/2022 at 2:30pm   Estelle Grumbles, PharmD, [facility] [phone]

## 2022-07-09 NOTE — Patient Instructions (Signed)
Goals Addressed             This Visit's Progress    Pharmacy Goals       Our goal A1c is less than 7%. This corresponds with fasting sugars less than 130 and 2 hour after meal sugars less than 180. Please keep a log of your results when checking your blood sugar   Our goal bad cholesterol, or LDL, is less than 70. This is why it is important to continue taking your atorvastatin.   The following is the healthy meal planning handout that we discussed. Please copy and paste the web address below into your web browser to view:   [email or web address]     Also, you can find more information about the Medicare and Seniors' Health Insurance Information Program (SHIIP) here:   https://www.ncdoi.gov/consumers/medicare-and-seniors-health-insurance-information-program-shiip   You can reach the SHIIP Counselor for [place] by calling:   [facility] [address]     [place]  [address] [phone]     Ask for help with SHIIP       You can find more information about the Medication Management Clinic here:   [email or web address]   Please reach out to the Medication Management Clinic by calling   [facility] [place] [facility] [address] [place], [place] [address] [phone]   Thank you!   Quetzali Heinle Caliope Ruppert, PharmD, BCACP [facility] [phone]        

## 2022-07-14 ENCOUNTER — Ambulatory Visit: Payer: Self-pay | Admitting: *Deleted

## 2022-07-14 NOTE — Telephone Encounter (Signed)
Summary: Sick Symptoms/Vomiting+Achy   [name] called in and stated that patient is currently having a lot of pressure behind eyes & nose, has a deep cough, vomited a little bit yesterday, whole body aches. Patient is also currently not eating and sleeping, and when he wakes up of a morning, his eyes are matted shut. Been sick since early Thursday. Patient is limited of medication he can take because of his health, been taking tylenol every 5 hours & a little bit of dayquil. Patient gets worse by the day.    Patients contact number#:  [phone] (patients wife stated she will have to talk, [name], as she stated patient currently can not talk, he is hoarse, VERY sore throat)           Attempted to reach pt, VM full, unable to leave message to call back.

## 2022-07-14 NOTE — Telephone Encounter (Signed)
  Chief Complaint: URI - fever Symptoms: HA, BA, cough, Hoarse Frequency: Thursday Pertinent Negatives: Patient denies  Disposition: [] ED /[] Urgent Care (no appt availability in office) / [] Appointment(In office/virtual)/ []  [facility]/ [] Home Care/ [x] Refused Recommended Disposition /[] [facility]/ []  Follow-up with PCP Additional Notes: Spoke with pt's wife. Pt has been ill since Thursday. Pt has URI with BA, possible fever, hoarse, not eating, cough. No appt in office within 24 hours. Wife scheduled ov for weds. Called wife back and offered VV, she will check with pt and call back if he is willing to have  a vv. She will call back or take pt to UC if s/s worsen. She will also go to pharmacy to see if there is another OTC medication that pt could take for symptom relief.  Curtis [name] called back. Pt will wait for weds in person appt.    Summary: Sick Symptoms/Vomiting+Achy   [name] called in and stated that patient is currently having a lot of pressure behind eyes & nose, has a deep cough, vomited a little bit yesterday, whole body aches. Patient is also currently not eating and sleeping, and when he wakes up of a morning, his eyes are matted shut. Been sick since early Thursday. Patient is limited of medication he can take because of his health, been taking tylenol every 5 hours & a little bit of dayquil. Patient gets worse by the day.    Patients contact number#:  [phone] (patients wife stated she will have to talk, Curtis [name], as she stated patient currently can not talk, he is hoarse, VERY sore throat)     Reason for Disposition  Fever present > 3 days (72 hours)  Answer Assessment - Initial Assessment Questions 1. ONSET: "When did the cough begin?"      Thursday 2. SEVERITY: "How bad is the cough today?"      Moderate 3. SPUTUM: "Describe the color of your sputum" (none, dry cough; clear, white, yellow, green)      4. HEMOPTYSIS: "Are you coughing up  any blood?" If so ask: "How much?" (flecks, streaks, tablespoons, etc.)      5. DIFFICULTY BREATHING: "Are you having difficulty breathing?" If Yes, ask: "How bad is it?" (e.g., mild, moderate, severe)    - MILD: No SOB at rest, mild SOB with walking, speaks normally in sentences, can lie down, no retractions, pulse < 100.    - MODERATE: SOB at rest, SOB with minimal exertion and prefers to sit, cannot lie down flat, speaks in phrases, mild retractions, audible wheezing, pulse 100-120.    - SEVERE: Very SOB at rest, speaks in single words, struggling to breathe, sitting hunched forward, retractions, pulse > 120       6. FEVER: "Do you have a fever?" If Yes, ask: "What is your temperature, how was it measured, and when did it start?"     Has been taking double dose of Tylenol 7. CARDIAC HISTORY: "Do you have any history of heart disease?" (e.g., heart attack, congestive heart failure)      yes 8. LUNG HISTORY: "Do you have any history of lung disease?"  (e.g., pulmonary embolus, asthma, emphysema)     no 10. OTHER SYMPTOMS: "Do you have any other symptoms?" (e.g., runny nose, wheezing, chest pain)       BA, HA, Not eating, possible fever  Protocols used: Cough - Acute Non-Productive-A-AH

## 2022-07-14 NOTE — Telephone Encounter (Signed)
Summary: Sick Symptoms/Vomiting+Achy   [name] called in and stated that patient is currently having a lot of pressure behind eyes & nose, has a deep cough, vomited a little bit yesterday, whole body aches. Patient is also currently not eating and sleeping, and when he wakes up of a morning, his eyes are matted shut. Been sick since early Thursday. Patient is limited of medication he can take because of his health, been taking tylenol every 5 hours & a little bit of dayquil. Patient gets worse by the day.    Patients contact number#:  [phone] (patients wife stated she will have to talk, [name], as she stated patient currently can not talk, he is hoarse, VERY sore throat)

## 2022-07-16 ENCOUNTER — Ambulatory Visit
Admission: RE | Admit: 2022-07-16 | Discharge: 2022-07-16 | Disposition: A | Discharge status: Home / Self Care | Payer: Self-pay | Attending: Physician Assistant | Admitting: Physician Assistant

## 2022-07-16 ENCOUNTER — Ambulatory Visit
Admission: RE | Admit: 2022-07-16 | Discharge: 2022-07-16 | Disposition: A | Discharge status: Home / Self Care | Payer: Self-pay | Source: Ambulatory Visit | Attending: Physician Assistant | Admitting: Physician Assistant

## 2022-07-16 ENCOUNTER — Ambulatory Visit (INDEPENDENT_AMBULATORY_CARE_PROVIDER_SITE_OTHER): Payer: Self-pay | Admitting: Physician Assistant

## 2022-07-16 ENCOUNTER — Encounter: Payer: Self-pay | Admitting: Physician Assistant

## 2022-07-16 VITALS — BP 134/67 | HR 88 | Temp 99.1°F | Resp 14

## 2022-07-16 DIAGNOSIS — J069 Acute upper respiratory infection, unspecified: Secondary | ICD-10-CM

## 2022-07-16 DIAGNOSIS — H109 Unspecified conjunctivitis: Secondary | ICD-10-CM

## 2022-07-16 DIAGNOSIS — J029 Acute pharyngitis, unspecified: Secondary | ICD-10-CM

## 2022-07-16 LAB — POCT RAPID STREP A (OFFICE): Rapid Strep A Screen: NEGATIVE

## 2022-07-16 LAB — POC COVID19 BINAXNOW: SARS Coronavirus 2 Ag: NEGATIVE

## 2022-07-16 MED ORDER — POLYMYXIN B-TRIMETHOPRIM 10000-0.1 UNIT/ML-% OP SOLN: 1.0000 [drp] | OPHTHALMIC | 0 refills | Status: AC

## 2022-07-16 NOTE — Progress Notes (Signed)
Established patient visit  Patient: Curtis [name]   DOB: 1955/04/20   67 y.o. Male  MRN: [id number] Visit Date: 07/16/2022  Today's healthcare provider: Debera Lat, PA-C   Chief Complaint  Patient presents with   URI    Patients wife states symptoms started last Thursday.   Subjective     HPI     URI   Associated symptoms inlclude congestion, diarrhea, headache and sore throat.  Recent episode started in the past 7 days.  The problem has been unchanged since onset.  The temperature has been with in normal range. Additional comments: Patients wife states symptoms started last Thursday.      Last edited by Lubertha Basque, CMA on 07/16/2022 10:19 AM.      Discussed the use of AI scribe software for clinical note transcription with the patient, who gave verbal consent to proceed.  History of Present Illness   The patient presents with a seven-day history of upper respiratory symptoms, including loss of voice, eye swelling, ear pain, and sore throat. He also reports bone pain. The symptoms are most severe in the morning, with the eyes being swollen and stuck together. He denies any vision problems or pain with eye movement. He also reports a sensation of pressure in the ears and facial congestion. He has a cough but denies any chest pain or wheezing. He also reports a fever but does not have a thermometer at home to measure it. He has been taking Tylenol and Dayquil for symptom relief. He has a history of heart disease and diabetes and is on multiple medications. He has not smoked for years and does not drink alcohol.           07/16/2022   11:22 AM 06/06/2022   11:14 AM 03/07/2022   11:21 AM  Depression screen PHQ 2/9  Decreased Interest 0 0 0  Down, Depressed, Hopeless 0 0 1  PHQ - 2 Score 0 0 1  Altered sleeping 0 0 0  Tired, decreased energy 0 1 1  Change in appetite 0 0 0  Feeling bad or failure about yourself  0 0 1  Trouble concentrating 0 0 0  Moving slowly or  fidgety/restless 0 0 0  Suicidal thoughts  0 0  PHQ-9 Score 0 1 3  Difficult doing work/chores Not difficult at all Not difficult at all Not difficult at all       No data to display          Medications: Outpatient Medications Prior to Visit  Medication Sig   acetaminophen (TYLENOL) 500 MG tablet Take 2 tablets (1,000 mg total) by mouth every 6 (six) hours.   aspirin 81 MG chewable tablet Chew 1 tablet (81 mg total) by mouth daily.   atorvastatin (LIPITOR) 80 MG tablet TAKE 1 TABLET(80 MG) BY MOUTH DAILY   empagliflozin (JARDIANCE) 10 MG TABS tablet Take 1 tablet (10 mg total) by mouth daily before breakfast.   famotidine (PEPCID) 40 MG tablet Take 1 tablet (40 mg total) by mouth daily.   furosemide (LASIX) 20 MG tablet Take 2 tablets (40 mg total) by mouth daily.   glipiZIDE (GLUCOTROL) 10 MG tablet TAKE 1 TABLET(10 MG) BY MOUTH TWICE DAILY BEFORE A MEAL   HYDROcodone-acetaminophen (NORCO/VICODIN) 5-325 MG tablet Take 1 tablet by mouth every 6 (six) hours as needed for moderate pain.   Lancet Devices MISC Use to check blood sugar twice a day   metFORMIN (GLUCOPHAGE-XR) 500 MG 24 hr tablet TAKE  1 TABLET(500 MG) BY MOUTH DAILY   metoprolol succinate (TOPROL-XL) 50 MG 24 hr tablet Take 1 tablet (50 mg total) by mouth daily.   sacubitril-valsartan (ENTRESTO) 97-103 MG TAKE 1 TABLET BY MOUTH 2 (TWO) TIMES DAILY.   Semaglutide,0.25 or 0.5MG /DOS, (OZEMPIC, 0.25 OR 0.5 MG/DOSE,) 2 MG/3ML SOPN Inject 0.5 mg into the skin once a week.   silver sulfADIAZINE (SILVADENE) 1 % cream Apply 1 Application topically daily.   spironolactone (ALDACTONE) 25 MG tablet Take 1 tablet (25 mg total) by mouth daily. ON HOLD DUE TO HYPERKALEMIA   No facility-administered medications prior to visit.    Review of Systems  Constitutional:  Positive for appetite change and fatigue.  HENT:  Positive for congestion, sinus pressure, sore throat and voice change.   Eyes:  Positive for discharge and redness.   Respiratory:  Positive for cough.   Neurological:  Positive for headaches.   Except see HPI      Objective    BP 134/67 (BP Location: Left Arm, Patient Position: Sitting, Cuff Size: Normal)   Pulse 88   Temp 99.1 F (37.3 C) (Oral)   Resp 14   SpO2 95%    Physical Exam Vitals reviewed.  Constitutional:      General: He is in acute distress.     Appearance: Normal appearance. He is not ill-appearing, toxic-appearing or diaphoretic.  HENT:     Head: Normocephalic and atraumatic.     Right Ear: Tympanic membrane, ear canal and external ear normal.     Left Ear: Tympanic membrane, ear canal and external ear normal.     Nose: Congestion and rhinorrhea present.     Mouth/Throat:     Pharynx: Posterior oropharyngeal erythema present.  Eyes:     General: No scleral icterus.       Right eye: No discharge.        Left eye: No discharge.     Extraocular Movements: Extraocular movements intact.     Conjunctiva/sclera: Conjunctivae normal.     Pupils: Pupils are equal, round, and reactive to light.  Cardiovascular:     Rate and Rhythm: Normal rate and regular rhythm.     Pulses: Normal pulses.     Heart sounds: Normal heart sounds. No murmur heard. Pulmonary:     Effort: Pulmonary effort is normal. No respiratory distress.     Breath sounds: Normal breath sounds. No wheezing or rhonchi.  Abdominal:     General: Abdomen is flat. Bowel sounds are normal.     Palpations: Abdomen is soft.  Musculoskeletal:        General: Normal range of motion.     Cervical back: Normal range of motion and neck supple.     Right lower leg: No edema.     Left lower leg: No edema.  Lymphadenopathy:     Cervical: No cervical adenopathy.  Skin:    General: Skin is warm and dry.     Findings: No rash.  Neurological:     General: No focal deficit present.     Mental Status: He is alert and oriented to person, place, and time. Mental status is at baseline.  Psychiatric:        Behavior: Behavior  normal.        Thought Content: Thought content normal.      Results for orders placed or performed in visit on 07/16/22  POC COVID-19  Result Value Ref Range   SARS Coronavirus 2 Ag Negative Negative  POCT  rapid strep A  Result Value Ref Range   Rapid Strep A Screen Negative Negative    Assessment & Plan        Upper Respiratory Infection: Symptoms include sore throat, cough, facial pressure, and conjunctivitis. Abnormal lung sounds noted on examination, raising concern for possible pneumonia or bronchitis. -Order chest x-ray to rule out pneumonia or bronchitis. -Prescribe antibiotic eye drops for conjunctivitis. -Advise warm salt water gargles every hour for sore throat. -Continue Tylenol for fever and pain. -Continue Mucinex for cough.  Conjunctivitis: most likely bacterial by etiology. Morning eye swelling and discharge. -Prescribe antibiotic eye drops. -Continue Tylenol as needed for pain.      No follow-ups on file. Advised pt to FU with Dr. Sherrie Mustache for his chronic conditions.    The patient was advised to call back or seek an in-person evaluation if the symptoms worsen or if the condition fails to improve as anticipated.  I discussed the assessment and treatment plan with the patient. The patient was provided an opportunity to ask questions and all were answered. The patient agreed with the plan and demonstrated an understanding of the instructions.  I, Debera Lat, PA-C have reviewed all documentation for this visit. The documentation on  07/16/22  for the exam, diagnosis, procedures, and orders are all accurate and complete.  Debera Lat, [facility], MMS [facility] [phone] (phone) [phone] (fax)  [facility]

## 2022-07-18 ENCOUNTER — Telehealth: Payer: Self-pay

## 2022-07-18 NOTE — Telephone Encounter (Signed)
Copied from CRM [id number]. Topic: General - Other >> Jul 17, 2022  4:20 PM Ja-Kwan M wrote: Reason for CRM: Pt spouse called for imaging results. Cb# [phone]

## 2022-07-18 NOTE — Telephone Encounter (Signed)
The patient's wife is calling back, stating that the patient is not getting any better and they still have not received the imaging results. Pt wife is requesting a callback today.    Please advise.

## 2022-07-21 ENCOUNTER — Telehealth: Payer: Self-pay | Admitting: Family Medicine

## 2022-07-21 DIAGNOSIS — J069 Acute upper respiratory infection, unspecified: Secondary | ICD-10-CM

## 2022-07-21 MED ORDER — LEVOFLOXACIN 750 MG PO TABS: 750.0000 mg | ORAL_TABLET | Freq: Every day | ORAL | 0 refills | Status: AC

## 2022-07-21 NOTE — Telephone Encounter (Signed)
Pt's daughter came in today - stated her mother has called twice and no return call. Pt's chest XR results are not back yet and patient is getting worse. Weakness and fever continue to worsen. Daughter asking for antibiotic to be called in. pt was seen by [place] on 07/16/22 and no medication given.

## 2022-07-21 NOTE — Telephone Encounter (Signed)
Contacted patient's wife and daughter to update.  Sent message in MyChart for patient to refer to incase either needed clarity.

## 2022-07-21 NOTE — Telephone Encounter (Signed)
Xray has still not been read. Have sent prescription for levofloxacin to [facility]. Go to ER if he has trouble breathing or any mental status changes.  Antibiotic can cause low blood sugar, so he needs to reduce glipizide to 1/2 tablet once a day while he is on antibiotic.

## 2022-07-22 ENCOUNTER — Telehealth: Payer: Self-pay | Admitting: Family Medicine

## 2022-07-22 ENCOUNTER — Other Ambulatory Visit: Payer: Self-pay

## 2022-07-22 NOTE — Telephone Encounter (Signed)
RECEIVED LETTER FROM WORKER [employer] STATING HE DOES NOT HAVE ANY COMMERCIAL INSURANCE.   FOR OZEMPIC  FROM [employer]   PLEASE BE ADVISED

## 2022-07-22 NOTE — Telephone Encounter (Signed)
Pt's wife stated Curtis [name] called last night to check on pt to see how he was doing.  Wife states she and patient prefer not to have any follow up with her due to experience with last OV and unable to understand her.    Last visit type modifer for pt and his wife.

## 2022-07-23 ENCOUNTER — Other Ambulatory Visit: Payer: Self-pay

## 2022-07-23 ENCOUNTER — Telehealth: Payer: Self-pay | Admitting: Pharmacist

## 2022-07-23 ENCOUNTER — Other Ambulatory Visit: Payer: Self-pay | Admitting: Pharmacist

## 2022-07-23 DIAGNOSIS — E782 Mixed hyperlipidemia: Secondary | ICD-10-CM

## 2022-07-23 DIAGNOSIS — E1165 Type 2 diabetes mellitus with hyperglycemia: Secondary | ICD-10-CM

## 2022-07-23 NOTE — Progress Notes (Unsigned)
Collaborating with Medication Management Clinic at [facility] for patient to be enrolled in their medication assistance program to help uninsured patient with cost of his medications.  Would you please send new prescriptions for atorvastatin, famotidine, glipizide, metformin to [facility] for him? Would you please include on the prescriptions "Note [facility] patient".  Thank you!  Estelle Grumbles, PharmD, [facility] [phone]

## 2022-07-23 NOTE — Progress Notes (Signed)
07/23/2022 Name: Curtis [name] MRN: [id number] DOB: Mar 21, 1955  Chief Complaint  Patient presents with   Medication Assistance    Curtis [name] is a 67 y.o. year old male who presented for a telephone visit.   Speak with patient's spouse, Konor Montantes (listed on DPR in chart), today.    Subjective:  Care Team: Primary Care Provider: Malva Limes, MD Cardiologist: Dolores Patty, MD  Medication Access/Adherence  Current Pharmacy:  [facility] [phone] Cheree Ditto, [place] - [address] AT [facility] OF [address] [place] [address] [place] [address] Phone: [phone] Fax: [phone]  [place] [facility] [address] [place] [address] Phone: [phone] Fax: [phone]  [facility] [place]ie [place], [place] - [address] [place] [address] Phone: [phone] Fax: [phone]   Patient reports affordability concerns with their medications: Yes  Patient reports access/transportation concerns to their pharmacy: No  Patient reports adherence concerns with their medications:  No    From review of chart, note patient seen in office on 6/26 related to upper respiratory symptoms and conjunctivitis - Provider advised patient to start Polytrim eye drops - Order placed for chest X-ray - On 7/1 PCP prescribed for patient to start Levaquin 750 mg daily for 7 days  Today patient's wife reports patient started Levaquin course on Tuesday morning and feels like patient is getting some better.   Per message from CPhT Linward Foster, note she received letter from patient's Worker's comp stating that patient does not have commercial insurance and has faxed this document on to [employer]. - Spouse reports patient currently has >1 month supply of Ozempic from samples remaining  Spouse states that she followed up with [facility] about enrollment in their assistance  program, but requests further assistance.  Objective:  Lab Results  Component Value Date   HGBA1C 9.1 (A) 06/06/2022    Lab Results  Component Value Date   CREATININE 1.12 02/17/2022   BUN 23 02/17/2022   NA 136 02/17/2022   K 4.0 02/17/2022   CL 103 02/17/2022   CO2 25 02/17/2022    Lab Results  Component Value Date   CHOL 175 06/06/2022   HDL 21 (L) 06/06/2022   LDLCALC Comment (A) 06/06/2022   LDLDIRECT 16.7 12/05/2020   TRIG 881 (HH) 06/06/2022   CHOLHDL 8.3 (H) 06/06/2022    Medications Reviewed Today     Reviewed by Lubertha Basque, CMA (Certified Medical Assistant) on 07/16/22 at 1015  Med List Status: <None>   Medication Order Taking? Sig Documenting Provider Last Dose Status Informant  acetaminophen (TYLENOL) 500 MG tablet [id number] Yes Take 2 tablets (1,000 mg total) by mouth every 6 (six) hours. Sharlene Dory, [place] Taking Active   aspirin 81 MG chewable tablet [id number] Yes Chew 1 tablet (81 mg total) by mouth daily. Robbie Lis M, PA-C Taking Active   atorvastatin (LIPITOR) 80 MG tablet [id number] Yes TAKE 1 TABLET(80 MG) BY MOUTH DAILY Malva Limes, MD Taking Active   empagliflozin (JARDIANCE) 10 MG TABS tablet [id number] Yes Take 1 tablet (10 mg total) by mouth daily before breakfast. Bensimhon, Bevelyn Buckles, MD Taking Active   famotidine (PEPCID) 40 MG tablet [id number] Yes Take 1 tablet (40 mg total) by mouth daily. Malva Limes, MD Taking Active   furosemide (LASIX) 20 MG tablet [id number] Yes Take 2 tablets (40 mg total)  by mouth daily. Bensimhon, Bevelyn Buckles, MD Taking Active   glipiZIDE (GLUCOTROL) 10 MG tablet [id number] Yes TAKE 1 TABLET(10 MG) BY MOUTH TWICE DAILY BEFORE A MEAL Fisher, Demetrios Isaacs, MD Taking Active   HYDROcodone-acetaminophen (NORCO/VICODIN) 5-325 MG tablet [id number] Yes Take 1 tablet by mouth every 6 (six) hours as needed for moderate pain. Malva Limes, MD Taking Active   Lancet Devices MISC [id number] Yes Use to check blood sugar  twice a day Malva Limes, MD Taking Active Self  metFORMIN (GLUCOPHAGE-XR) 500 MG 24 hr tablet [id number] Yes TAKE 1 TABLET(500 MG) BY MOUTH DAILY Malva Limes, MD Taking Active   metoprolol succinate (TOPROL-XL) 50 MG 24 hr tablet [id number] Yes Take 1 tablet (50 mg total) by mouth daily. Bensimhon, Bevelyn Buckles, MD Taking Active   sacubitril-valsartan (ENTRESTO) 97-103 MG [id number] Yes TAKE 1 TABLET BY MOUTH 2 (TWO) TIMES DAILY. [place], Anderson Malta, FNP Taking Active   Semaglutide,0.25 or 0.5MG /DOS, (OZEMPIC, 0.25 OR 0.5 MG/DOSE,) 2 MG/3ML SOPN [id number] Yes Inject 0.5 mg into the skin once a week. [provider] Taking Active   silver sulfADIAZINE (SILVADENE) 1 % cream [id number] Yes Apply 1 Application topically daily. Malva Limes, MD Taking Active   spironolactone (ALDACTONE) 25 MG tablet [id number] Yes Take 1 tablet (25 mg total) by mouth daily. ON HOLD DUE TO HYPERKALEMIA Fisher, Demetrios Isaacs, MD Taking Active               Assessment/Plan:   Collaborate with Patient Advocate Specialist Bernadene Person at [facility] today. Valda Lamb advises next step for patient to receive medications through the [facility] is for the provider to send prescriptions to the pharmacy - Will collaborate with PCP to request provider send prescription for atorvastatin, famotidine, glipizide, metformin to [facility] for patient  Again recommend patient reach out to the [employer] and Seniors' [employer] Information Program [facility]) to schedule an appointment with a counselor for help with enrolling in a Medicare plan in the future    Follow Up Plan: Clinical Pharmacist will follow up with patient/spouse by telephone again next month  Estelle Grumbles, PharmD, [name] [place], CPP Clinical Pharmacist [facility] [phone]

## 2022-07-24 ENCOUNTER — Other Ambulatory Visit: Payer: Self-pay

## 2022-07-24 ENCOUNTER — Other Ambulatory Visit: Payer: Self-pay | Admitting: Family Medicine

## 2022-07-24 DIAGNOSIS — E1165 Type 2 diabetes mellitus with hyperglycemia: Secondary | ICD-10-CM

## 2022-07-24 MED ORDER — FAMOTIDINE 40 MG PO TABS
40.0000 mg | ORAL_TABLET | Freq: Every day | ORAL | 4 refills | Status: AC
  Filled 2022-07-24: qty 90, 90d supply, fill #0

## 2022-07-24 MED ORDER — METFORMIN HCL ER 500 MG PO TB24
500.0000 mg | ORAL_TABLET | Freq: Every day | ORAL | 4 refills | Status: DC
Start: 2022-07-24 — End: 2023-09-04
  Filled 2022-07-24: qty 90, 90d supply, fill #0
  Filled 2022-11-25 (×2): qty 90, 90d supply, fill #1
  Filled 2023-02-26: qty 90, 90d supply, fill #2
  Filled 2023-05-29: qty 90, 90d supply, fill #3

## 2022-07-24 MED ORDER — ATORVASTATIN CALCIUM 80 MG PO TABS
80.0000 mg | ORAL_TABLET | Freq: Every day | ORAL | 4 refills | Status: DC
  Filled 2022-07-24: qty 90, 90d supply, fill #0
  Filled 2022-11-25: qty 90, 90d supply, fill #1
  Filled 2023-03-10: qty 90, 90d supply, fill #2
  Filled 2023-06-17: qty 90, 90d supply, fill #3

## 2022-07-24 MED ORDER — GLIPIZIDE 10 MG PO TABS
10.0000 mg | ORAL_TABLET | Freq: Two times a day (BID) | ORAL | 1 refills | Status: DC
  Filled 2022-07-24: qty 180, 90d supply, fill #0
  Filled 2022-11-25 (×2): qty 180, 90d supply, fill #1

## 2022-07-25 ENCOUNTER — Other Ambulatory Visit: Payer: Self-pay

## 2022-07-25 NOTE — Patient Instructions (Signed)
Goals Addressed             This Visit's Progress    Pharmacy Goals       Our goal A1c is less than 7%. This corresponds with fasting sugars less than 130 and 2 hour after meal sugars less than 180. Please keep a log of your results when checking your blood sugar   Our goal bad cholesterol, or LDL, is less than 70. This is why it is important to continue taking your atorvastatin.   The following is the healthy meal planning handout that we discussed. Please copy and paste the web address below into your web browser to view:   [email or web address]     Also, you can find more information about the Medicare and Seniors' [employer] Information Program [facility]) here:   [email or web address]   You can reach the [facility] Counselor for [facility] by calling:   Toma Deiters [facility] [address]     [place]  [address] [phone]     Ask for help with [facility]     Please follow up with the [facility] by calling   [facility] Building [address] [place], [place] [address] [phone]   Thank you!    Estelle Grumbles, PharmD, [facility] [phone]

## 2022-07-25 NOTE — Telephone Encounter (Signed)
Unable to refill per protocol, Rx request is too soon. Last refill 07/24/22 for 90 and 4 refills.  Requested Prescriptions  Pending Prescriptions Disp Refills   metFORMIN (GLUCOPHAGE-XR) 500 MG 24 hr tablet [Pharmacy Med Name: METFORMIN ER 500MG  24HR TABS] 90 tablet 4    Sig: TAKE 1 TABLET(500 MG) BY MOUTH DAILY     Endocrinology:  Diabetes - Biguanides Failed - 07/24/2022  3:12 AM      Failed - HBA1C is between 0 and 7.9 and within 180 days    Hemoglobin A1C  Date Value Ref Range Status  06/06/2022 9.1 (A) 4.0 - 5.6 % Final  03/19/2018 >14.0  Final   Hgb A1c MFr Bld  Date Value Ref Range Status  12/10/2021 11.1 (H) 4.8 - 5.6 % Final    Comment:             Prediabetes: 5.7 - 6.4          Diabetes: >6.4          Glycemic control for adults with diabetes: <7.0          Failed - B12 Level in normal range and within 720 days    No results found for: "VITAMINB12"       Failed - CBC within normal limits and completed in the last 12 months    WBC  Date Value Ref Range Status  02/15/2021 9.0 3.4 - 10.8 x10E3/uL Final  12/05/2020 7.2 4.0 - 10.5 K/uL Final   RBC  Date Value Ref Range Status  02/15/2021 3.86 (L) 4.14 - 5.80 x10E6/uL Final  12/05/2020 3.90 (L) 4.22 - 5.81 MIL/uL Final   Hemoglobin  Date Value Ref Range Status  02/15/2021 11.3 (L) 13.0 - 17.7 g/dL Final   Total hemoglobin  Date Value Ref Range Status  04/07/2019 10.1 (L) 12.0 - 16.0 g/dL Final   Hematocrit  Date Value Ref Range Status  02/15/2021 33.7 (L) 37.5 - 51.0 % Final   MCHC  Date Value Ref Range Status  02/15/2021 33.5 31.5 - 35.7 g/dL Final  14/78/2956 21.3 30.0 - 36.0 g/dL Final   [facility]  Date Value Ref Range Status  02/15/2021 29.3 26.6 - 33.0 pg Final  12/05/2020 29.7 26.0 - 34.0 pg Final   MCV  Date Value Ref Range Status  02/15/2021 87 79 - 97 fL Final  05/07/2011 89 80 - 100 fL Final   No results found for: "PLTCOUNTKUC", "LABPLAT", "POCPLA" RDW  Date Value Ref Range Status  02/15/2021  13.7 11.6 - 15.4 % Final  05/07/2011 12.9 11.5 - 14.5 % Final         Passed - Cr in normal range and within 360 days    Creatinine, Ser  Date Value Ref Range Status  02/17/2022 1.12 0.61 - 1.24 mg/dL Final         Passed - eGFR in normal range and within 360 days    GFR calc Af Amer  Date Value Ref Range Status  07/18/2019 >60 >60 mL/min Final   GFR, Estimated  Date Value Ref Range Status  02/17/2022 >60 >60 mL/min Final    Comment:    (NOTE) Calculated using the CKD-EPI Creatinine Equation (2021)    eGFR  Date Value Ref Range Status  12/10/2021 76 >59 mL/min/1.73 Final         Passed - Valid encounter within last 6 months    Recent Outpatient Visits           1 week  ago Upper respiratory tract infection, unspecified type   [facility] [place], [place], PA-C   1 month ago Uncontrolled type 2 diabetes mellitus with hyperglycemia [facility])   [place] [facility] Malva Limes, MD   4 months ago Cellulitis of left lower extremity   [place] [facility] Malva Limes, MD   4 months ago Uncontrolled type 2 diabetes mellitus with hyperglycemia [facility])   [facility] Malva Limes, MD   8 months ago Cellulitis of right lower extremity   [facility] Malva Limes, MD

## 2022-07-28 ENCOUNTER — Other Ambulatory Visit: Payer: Self-pay

## 2022-07-30 ENCOUNTER — Other Ambulatory Visit (HOSPITAL_COMMUNITY): Payer: Self-pay

## 2022-07-31 NOTE — Telephone Encounter (Signed)
CALL COMPANY ABOUT THE STATUS OF APPLICATION  FOR OZEMPIC FROM [employer]. COMPANY STATES THAT THEY NEEDED A COPY OF THE WORKERS COMP INSURANCE CARD. SPOKE TO WIFE AND SHE SENT ME A COPY OF CARD. I HAVE SUBMITTED  TO COMPANY FOR PAP  PROCESSING.  PLEASE BE ADVISED

## 2022-08-04 ENCOUNTER — Other Ambulatory Visit: Payer: Self-pay | Admitting: Family Medicine

## 2022-08-04 DIAGNOSIS — G8929 Other chronic pain: Secondary | ICD-10-CM

## 2022-08-04 NOTE — Telephone Encounter (Signed)
Medication Refill - Medication: HYDROcodone-acetaminophen (NORCO/VICODIN) 5-325 MG tablet   Has the patient contacted their pharmacy? No.  Preferred Pharmacy (with phone number or street name): [id number] - [place], [place] - [address] AT [facility] OF [address]  Has the patient been seen for an appointment in the last year OR does the patient have an upcoming appointment? Yes.    Agent: Please be advised that RX refills may take up to 3 business days. We ask that you follow-up with your pharmacy.

## 2022-08-05 NOTE — Telephone Encounter (Signed)
Requested medication (s) are due for refill today:Yes  Requested medication (s) are on the active medication list: Yes  Last refill:  05/06/22  Future visit scheduled: No  Notes to clinic:  Unable to refill per protocol, cannot delegate.      Requested Prescriptions  Pending Prescriptions Disp Refills   HYDROcodone-acetaminophen (NORCO/VICODIN) 5-325 MG tablet 30 tablet 0    Sig: Take 1 tablet by mouth every 6 (six) hours as needed for moderate pain.     Not Delegated - Analgesics:  Opioid Agonist Combinations Failed - 08/04/2022  2:48 PM      Failed - This refill cannot be delegated      Failed - Urine Drug Screen completed in last 360 days      Passed - Valid encounter within last 3 months    Recent Outpatient Visits           2 weeks ago Upper respiratory tract infection, unspecified type   [place] [facility] [place], [place], PA-C   2 months ago Uncontrolled type 2 diabetes mellitus with hyperglycemia [facility])   [place] [facility] Malva Limes, MD   4 months ago Cellulitis of left lower extremity   [place] [facility] Malva Limes, MD   5 months ago Uncontrolled type 2 diabetes mellitus with hyperglycemia [facility])    [facility] Malva Limes, MD   8 months ago Cellulitis of right lower extremity   [facility] Malva Limes, MD

## 2022-08-06 MED ORDER — HYDROCODONE-ACETAMINOPHEN 5-325 MG PO TABS
1.0000 | ORAL_TABLET | Freq: Four times a day (QID) | ORAL | 0 refills | Status: DC | PRN
Start: 2022-08-06 — End: 2022-08-22

## 2022-08-07 ENCOUNTER — Other Ambulatory Visit (HOSPITAL_COMMUNITY): Payer: Self-pay

## 2022-08-08 ENCOUNTER — Other Ambulatory Visit (HOSPITAL_COMMUNITY): Payer: Self-pay

## 2022-08-12 NOTE — Telephone Encounter (Signed)
Received notification from [employer] regarding approval for OZEMPIC. Patient assistance APPROVED from 08/03/2022 to 08/03/2023.  Phone: [phone]  PLEASE BE ADVISED PT CAN CALL THE NUMBER ABOVE CHECK SHIPPING STATUS   Letter of approval has been scanned in media of chart  Melanee Spry CPhT Rx Patient Advocate [phone]

## 2022-08-18 ENCOUNTER — Telehealth: Payer: Self-pay | Admitting: Pharmacist

## 2022-08-18 ENCOUNTER — Other Ambulatory Visit: Payer: Self-pay | Admitting: Pharmacist

## 2022-08-18 NOTE — Progress Notes (Signed)
08/18/2022 Name: Curtis [name] MRN: [id number] DOB: [birth year]  Chief Complaint  Patient presents with   Medication Management   Medication Assistance    Curtis [name] is a 67 y.o. year old male who was referred to the pharmacist by their PCP for assistance in managing medication access.   Speak with patient's spouse, Curtis [name] (listed on DPR in chart), today.   Subjective:  Care Team: Primary Care Provider: Malva Limes, MD Cardiologist: Dolores Patty, MD  Medication Access/Adherence  Current Pharmacy:  [facility] [phone] Cheree Ditto, [place] - [address] AT [facility] OF [address] [place] [address] [place] [address] Phone: [phone] Fax: [phone]   - [facility] [address] [place] [address] Phone: [phone] Fax: [phone]  [facility] [place], [place] - [address] [place] [address] Phone: [phone] Fax: [phone]  [facility] [address] [place] [address] Phone: [phone] Fax: [phone]   Patient reports affordability concerns with their medications: Yes  Patient reports access/transportation concerns to their pharmacy: No  Patient reports adherence concerns with their medications:  No    Diabetes:   Current medications:  - glipizide 10 mg twice daily before meals - metformin ER 500 mg daily - Ozempic 0.5 mg weekly on Sundays - Jardiance 10 mg daily before breakfast   Current glucose readings: reports recent morning fasting readings ranging 130-160s    Denies hypoglycemic s/sx including dizziness, shakiness, sweating.   Current physical activity: limited due to disability (in wheelchair) - limited to transferring    Current medication access support:  - Enrolled in [employer] Patient assistance program for Jardiance through 01/20/2023 ([facility] supported with application) - Enrolled in [employer] patient assistance program for Ozempic from 08/03/2022 to 08/03/2023   Note program ships medication to office, typically within 10-14 business days after approval  Today caregiver reports patient has 1 more dose of Ozempic 0.5 mg strength left to use on 8/4     Hyperlipidemia/ASCVD Risk Reduction   Current lipid lowering medications: atorvastatin 80 mg daily Medications tried in the past:    Antiplatelet regimen: aspirin 81 mg   Current physical activity: limited due to disability (in wheelchair) - limited to transferring    Current medication access support: none      Objective:  Lab Results  Component Value Date   HGBA1C 9.1 (A) 06/06/2022    Lab Results  Component Value Date   CREATININE 1.12 02/17/2022   BUN 23 02/17/2022   NA 136 02/17/2022   K 4.0 02/17/2022   CL 103 02/17/2022   CO2 25 02/17/2022    Lab Results  Component Value Date   CHOL 175 06/06/2022   HDL 21 (L) 06/06/2022   LDLCALC Comment (A) 06/06/2022   LDLDIRECT 16.7 12/05/2020   TRIG 881 (HH) 06/06/2022   CHOLHDL 8.3 (H) 06/06/2022   BP Readings from Last 3 Encounters:  07/16/22 134/67  06/06/22 (!) 144/81  03/14/22 (!) 163/78   Pulse Readings from Last 3 Encounters:  07/16/22 88  06/06/22 79  03/14/22 71     Medications Reviewed Today     Reviewed by Manuela Neptune, RPH-CPP (Pharmacist) on 08/18/22 at 1643  Med List Status: <None>   Medication Order Taking? Sig Documenting Provider Last Dose Status Informant  acetaminophen (TYLENOL) 500 MG  tablet [id number] No Take 2 tablets (1,000 mg total) by mouth every 6 (six) hours. Sharlene Dory, [place] Taking Active   aspirin 81 MG chewable tablet [id number] No Chew 1 tablet (81 mg total) by mouth daily. Robbie Lis M, PA-C Taking Active   atorvastatin (LIPITOR) 80 MG tablet [id number]  Take 1 tablet (80 mg total) by mouth daily. Malva Limes, MD  Active   empagliflozin  (JARDIANCE) 10 MG TABS tablet [id number] No Take 1 tablet (10 mg total) by mouth daily before breakfast. Bensimhon, Bevelyn Buckles, MD Taking Active   famotidine (PEPCID) 40 MG tablet [id number]  Take 1 tablet (40 mg total) by mouth daily. Malva Limes, MD  Active   furosemide (LASIX) 20 MG tablet [id number] No Take 2 tablets (40 mg total) by mouth daily. Bensimhon, Bevelyn Buckles, MD Taking Active   glipiZIDE (GLUCOTROL) 10 MG tablet [id number]  Take 1 tablet (10 mg total) by mouth 2 (two) times daily before a meal. Malva Limes, MD  Active   HYDROcodone-acetaminophen (NORCO/VICODIN) 5-325 MG tablet [id number]  Take 1 tablet by mouth every 6 (six) hours as needed for moderate pain. Malva Limes, MD  Active   Lancet Devices MISC [id number] No Use to check blood sugar twice a day Malva Limes, MD Taking Active Self  metFORMIN (GLUCOPHAGE-XR) 500 MG 24 hr tablet [id number]  Take 1 tablet (500 mg total) by mouth daily. Malva Limes, MD  Active   metoprolol succinate (TOPROL-XL) 50 MG 24 hr tablet [id number] No Take 1 tablet (50 mg total) by mouth daily. Bensimhon, Bevelyn Buckles, MD Taking Active   sacubitril-valsartan (ENTRESTO) 97-103 MG [id number] No TAKE 1 TABLET BY MOUTH 2 (TWO) TIMES DAILY. [place], Anderson Malta, FNP Taking Active   Semaglutide,0.25 or 0.5MG /DOS, (OZEMPIC, 0.25 OR 0.5 MG/DOSE,) 2 MG/3ML SOPN [id number] No Inject 0.5 mg into the skin once a week. [provider] Taking Active   silver sulfADIAZINE (SILVADENE) 1 % cream [id number] No Apply 1 Application topically daily. Malva Limes, MD Taking Active   spironolactone (ALDACTONE) 25 MG tablet [id number] No Take 1 tablet (25 mg total) by mouth daily. ON HOLD DUE TO HYPERKALEMIA Malva Limes, MD Taking Active               Assessment/Plan:   Recommend to contact office to schedule 77-month follow up appointment with PCP  Recommend outreach to the Medicare and Seniors' [employer] Information Program [facility]) to schedule  an appointment with a counselor for help with enrolling in a Medicare plan in the future - If needed, recommend patient/caregiver follow up with LCSW at [facility]  Diabetes: - Reviewed long term cardiovascular and renal outcomes of uncontrolled blood sugar - Reviewed goal A1c, goal fasting, and goal 2 hour post prandial glucose - Reviewed dietary modifications including importance of having regular well-balanced meals and snacks while controlling carbohydrate portion sizes             Encourage to review nutrition labels for total carbohydrate content of foods - Recommend to continue to check glucose, keep a log of the results and have this record to share with providers at upcoming medical appointments - Note plan to increase Ozempic dose to 1 mg weekly as prescribed by PCP, once received from assistance program Caregiver to follow up with [employer] regarding shipment from program if not received to office this week     Hyperlipidemia/ASCVD Risk Reduction: - Currently uncontrolled.  - Reviewed long  term complications of uncontrolled cholesterol - Patient unable to receive Vascepa at this time. Will collaborate with PCP regarding hyperlipidemia medication management options       Follow Up Plan: Clinical Pharmacist will follow up with patient by telephone on 10/01/2022 at 1:30 PM    Estelle Grumbles, PharmD, [facility] [phone]

## 2022-08-18 NOTE — Progress Notes (Unsigned)
Dr. Sherrie Mustache,  Reaching out to provide updates:  1) Patient approved for PAP for Ozempic 1 mg weekly and supply should be arriving to office for patient soon  2) Thank you for sending Rxs for patient to [facility]. Patient now successfully getting these through [facility]  3) Regarding patient's cholesterol: - No PAP available for Vascepa. [facility] is not able to provide this.  - As alternative for triglyceride control, could consider prescribing fenofibrate - For follow up labs next month, would you please consider also checking direct LDL? If LDL remaining elevated, could consider adding ezetimibe to the atorvastatin for LDL (but also some triglyceride) benefit  Thank you,  Estelle Grumbles, PharmD, [facility] [phone]

## 2022-08-18 NOTE — Patient Instructions (Signed)
Goals Addressed             This Visit's Progress    Pharmacy Goals       Our goal A1c is less than 7%. This corresponds with fasting sugars less than 130 and 2 hour after meal sugars less than 180. Please keep a log of your results when checking your blood sugar   Our goal bad cholesterol, or LDL, is less than 70. This is why it is important to continue taking your atorvastatin.    Also, you can find more information about the [employer] and Seniors' [employer] Information Program [facility]) here:   [email or web address]   You can reach the [facility] Counselor for [facility] by calling:   Toma Deiters [facility] [address]     [place]  [address] [phone]     Ask for help with [facility]     Thank you!    Estelle Grumbles, PharmD, [facility] [phone]

## 2022-08-21 ENCOUNTER — Telehealth: Payer: Self-pay | Admitting: Family Medicine

## 2022-08-21 ENCOUNTER — Ambulatory Visit: Payer: Self-pay | Admitting: *Deleted

## 2022-08-21 DIAGNOSIS — G8929 Other chronic pain: Secondary | ICD-10-CM

## 2022-08-21 NOTE — Telephone Encounter (Signed)
Pt had his HYDROcodone-acetaminophen (NORCO/VICODIN) 5-325 MG tablet sent to [facility], But they are out oft this medication and will be for  several weeks, pt would like you to transfer to [place] on [address]  [facility] - [place], [place] - [address]   Thank you

## 2022-08-21 NOTE — Telephone Encounter (Addendum)
  Chief Complaint: Wife [name] calling in.   Blisters on both legs.   They are draining a lot of liquid where it runs down his legs. Symptoms: Water blisters per wife.   She keeps towels under his legs because they drain so much.   The drainage soaks through the band aids she puts on them. Frequency: For the last 2 weeks Pertinent Negatives: Patient denies them being sores but water blisters Disposition: [] ED /[] Urgent Care (no appt availability in office) / [x] Appointment(In office/virtual)/ []  [place] [facility]/ [] Home Care/ [] Refused Recommended Disposition /[] [place] [facility]/ []  Follow-up with PCP Additional Notes: She demanded to only see Dr Sherrie Mustache.  Appt made for 09/12/2022 at 2:20.   I put him on the wait list in case of a cancellation.     His wife mentioned he has one more Ozempic shot left for Sunday.  Lanora Manis gets it for him and they mail it to [facility].   She said he will need it by Friday.

## 2022-08-21 NOTE — Telephone Encounter (Signed)
Reason for Disposition  [1] Multiple small blisters grouped together in one area of body (i.e., dermatomal distribution or "band" or "stripe") AND [2] painful    The sores have become water blisters.  Answer Assessment - Initial Assessment Questions 1. APPEARANCE of SORES: "What do the sores look like?"     Wife called in, [name]. L  He has places that come up.   Dr. Sherrie Mustache usually puts him on antibiotics but these are like water blisters.   His left leg has a huge water blister and leaks water and it's running down his leg. He has a big red sore looking place from where the wash cloth was used to wash his legs. 2. NUMBER: "How many sores are there?"     It's on both of his legs.   I'm keeping the cream on them and the band aids get full and leak the fluid out and down his leg. 3. SIZE: "How big is the largest sore?"     Quarter sized and little bitty ones too.   The top of his feet looks like a rash.   That's been there.    This is an on going thing but it's changed to water blisters now.   3 week ago his feet and legs swelled up and now these blisters are there.    His legs are feeling heavy.    He has been in pain for 15 years since he had an accident.  His sugar was 130.   It was 250.   The sugar has been doing better. 4. LOCATION: "Where are the sores located?"     Both legs 5. ONSET: "When did the sores begin?"     He is on Ozempic I don't know if that is causing it.    6. TENDER: "Does it hurt when you touch it?"  (Scale 1-10; or mild, moderate, severe)      Not asked 7. CAUSE: "What do you think is causing the sores?"     I don't know 8. OTHER SYMPTOMS: "Do you have any other symptoms?" (e.g., fever, new weakness)     I'm dealing with Lanora Manis regarding his medications.   She told me Friday that it it's about time for his A1C.  Protocols used: [employer]

## 2022-08-22 MED ORDER — HYDROCODONE-ACETAMINOPHEN 5-325 MG PO TABS
1.0000 | ORAL_TABLET | Freq: Four times a day (QID) | ORAL | 0 refills | Status: DC | PRN
Start: 2022-08-22 — End: 2022-10-15

## 2022-08-22 NOTE — Telephone Encounter (Addendum)
There is a sample pen of Ozempic with his name on it in the fridge that he can have. We haven't received the supply from the patient assistance program yet.  There are SameDay slots available next week that he can have.

## 2022-08-22 NOTE — Telephone Encounter (Signed)
[name] advised as below.

## 2022-08-26 ENCOUNTER — Ambulatory Visit (INDEPENDENT_AMBULATORY_CARE_PROVIDER_SITE_OTHER): Payer: Self-pay | Admitting: Family Medicine

## 2022-08-26 ENCOUNTER — Other Ambulatory Visit: Payer: Self-pay

## 2022-08-26 VITALS — BP 163/78 | HR 78 | Temp 98.2°F | Ht 68.0 in | Wt 179.0 lb

## 2022-08-26 DIAGNOSIS — R609 Edema, unspecified: Secondary | ICD-10-CM

## 2022-08-26 DIAGNOSIS — L03119 Cellulitis of unspecified part of limb: Secondary | ICD-10-CM

## 2022-08-26 DIAGNOSIS — S80829A Blister (nonthermal), unspecified lower leg, initial encounter: Secondary | ICD-10-CM

## 2022-08-26 MED ORDER — CEPHALEXIN 500 MG PO CAPS
500.0000 mg | ORAL_CAPSULE | Freq: Three times a day (TID) | ORAL | 0 refills | Status: DC
Start: 2022-08-26 — End: 2022-09-12
  Filled 2022-08-26: qty 30, 10d supply, fill #0

## 2022-08-26 MED ORDER — METOLAZONE 2.5 MG PO TABS
2.5000 mg | ORAL_TABLET | Freq: Every day | ORAL | 0 refills | Status: DC
Start: 2022-08-26 — End: 2022-11-14
  Filled 2022-08-26: qty 30, 30d supply, fill #0

## 2022-08-26 NOTE — Patient Instructions (Signed)
.   Please review the attached list of medications and notify my office if there are any errors.   . Please bring all of your medications to every appointment so we can make sure that our medication list is the same as yours.   

## 2022-08-27 NOTE — Progress Notes (Signed)
Established patient visit   Patient: Curtis [name]   DOB: 1955-01-21   67 y.o. Male  MRN: [id number] Visit Date: 08/26/2022  Today's healthcare provider: Mila Merry, MD   Chief Complaint  Patient presents with   Lower extremity blisters    Patient has had history of some lower extremity sores.  He had cleared all of them at one point.  For the past several weeks he has had blisters to come up on his lower legs.  They report that the blisters are now draining a lot.     Subjective    HPI  Only recent medication change is increasing semaglutide to 0.5mg  weekly which he has otherwise tolerated well. No fevers, chills, sweats, no known trauma. No shortness of breath.   Medications: Outpatient Medications Prior to Visit  Medication Sig   acetaminophen (TYLENOL) 500 MG tablet Take 2 tablets (1,000 mg total) by mouth every 6 (six) hours.   aspirin 81 MG chewable tablet Chew 1 tablet (81 mg total) by mouth daily.   atorvastatin (LIPITOR) 80 MG tablet Take 1 tablet (80 mg total) by mouth daily.   empagliflozin (JARDIANCE) 10 MG TABS tablet Take 1 tablet (10 mg total) by mouth daily before breakfast.   famotidine (PEPCID) 40 MG tablet Take 1 tablet (40 mg total) by mouth daily.   furosemide (LASIX) 20 MG tablet Take 2 tablets (40 mg total) by mouth daily.   glipiZIDE (GLUCOTROL) 10 MG tablet Take 1 tablet (10 mg total) by mouth 2 (two) times daily before a meal.   HYDROcodone-acetaminophen (NORCO/VICODIN) 5-325 MG tablet Take 1 tablet by mouth every 6 (six) hours as needed for moderate pain.   Lancet Devices MISC Use to check blood sugar twice a day   metFORMIN (GLUCOPHAGE-XR) 500 MG 24 hr tablet Take 1 tablet (500 mg total) by mouth daily.   metoprolol succinate (TOPROL-XL) 50 MG 24 hr tablet Take 1 tablet (50 mg total) by mouth daily.   sacubitril-valsartan (ENTRESTO) 97-103 MG TAKE 1 TABLET BY MOUTH 2 (TWO) TIMES DAILY.   Semaglutide,0.25 or 0.5MG /DOS, (OZEMPIC, 0.25 OR 0.5  MG/DOSE,) 2 MG/3ML SOPN Inject 0.5 mg into the skin once a week.   silver sulfADIAZINE (SILVADENE) 1 % cream Apply 1 Application topically daily.   spironolactone (ALDACTONE) 25 MG tablet Take 1 tablet (25 mg total) by mouth daily. ON HOLD DUE TO HYPERKALEMIA   No facility-administered medications prior to visit.      Objective    BP (!) 163/78 (BP Location: Right Arm, Patient Position: Sitting, Cuff Size: Normal)   Pulse 78   Temp 98.2 F (36.8 C) (Oral)   Ht 5\' 8"  (1.727 m)   Wt 179 lb (81.2 kg) Comment: estimated  SpO2 99%   BMI 27.22 kg/m    Physical Exam    General: Appearance:    Well developed, well nourished male in no acute distress  Eyes:    PERRL, conjunctiva/corneas clear, EOM's intact       Lungs:     Clear to auscultation bilaterally, respirations unlabored  Heart:    Normal heart rate. Normal rhythm. No murmurs, rubs, or gallops.    MS:   All extremities are intact.    Neurologic:   Awake, alert, oriented x 3. No apparent focal neurological defect.            Assessment & Plan     1. Blister (nonthermal), unspecified lower leg, initial encounter Unclear etiology. He does have  more edema than usual, although no other signs of HF.  He did recently increase semaglutide to 0.5, but he had been taking lower dose for several months before onset of this. Will put semaglutide on hold temporarily.   Wrapped with Vaseline gauze and curlex.   2. Edema, unspecified type Add- metolazone (ZAROXOLYN) 2.5 MG tablet; Take 1 tablet (2.5 mg total) by mouth daily.  Dispense: 30 tablet; Refill: 0  3. Cellulitis of lower extremity, unspecified laterality  - cephALEXin (KEFLEX) 500 MG capsule; Take 1 capsule (500 mg total) by mouth 3 (three) times daily for 10 days.  Dispense: 30 capsule; Refill: 0  Call if symptoms change or if not rapidly improving.    Return in about 2 weeks (around 09/09/2022).      The entirety of the information documented in the History of Present  Illness, Review of Systems and Physical Exam were personally obtained by me. Portions of this information were initially documented by the CMA and reviewed by me for thoroughness and accuracy.     Mila Merry, MD  [facility] [phone] (phone) [phone] (fax)  [facility]

## 2022-09-01 ENCOUNTER — Telehealth: Payer: Self-pay

## 2022-09-01 NOTE — Telephone Encounter (Signed)
Copied from CRM [id number]. Topic: General - Other >> Sep 01, 2022  1:59 PM Franchot Heidelberg wrote: Reason for CRM: Dawn calling from [employer], patient assistance program. The medication has shipped for [facility] number: [id number]  Best contact: [phone] M-F 8am-8pm  Dawn wants to make sure that we notify the patient once we receive  Enroll end date is 08/03/2023. Wants to make sure that he enrolls 30 days prior then.

## 2022-09-10 ENCOUNTER — Other Ambulatory Visit: Payer: Self-pay

## 2022-09-11 ENCOUNTER — Other Ambulatory Visit (HOSPITAL_COMMUNITY): Payer: Self-pay

## 2022-09-12 ENCOUNTER — Ambulatory Visit (INDEPENDENT_AMBULATORY_CARE_PROVIDER_SITE_OTHER): Payer: Self-pay | Admitting: Family Medicine

## 2022-09-12 ENCOUNTER — Other Ambulatory Visit: Payer: Self-pay

## 2022-09-12 ENCOUNTER — Ambulatory Visit: Payer: Self-pay | Admitting: Family Medicine

## 2022-09-12 ENCOUNTER — Encounter: Payer: Self-pay | Admitting: Family Medicine

## 2022-09-12 VITALS — BP 135/65 | HR 76 | Temp 97.6°F | Resp 12 | Ht 68.0 in | Wt 173.0 lb

## 2022-09-12 DIAGNOSIS — L03119 Cellulitis of unspecified part of limb: Secondary | ICD-10-CM

## 2022-09-12 DIAGNOSIS — I5020 Unspecified systolic (congestive) heart failure: Secondary | ICD-10-CM

## 2022-09-12 MED ORDER — CEPHALEXIN 500 MG PO CAPS
500.0000 mg | ORAL_CAPSULE | Freq: Three times a day (TID) | ORAL | 0 refills | Status: DC
Start: 2022-09-12 — End: 2022-11-14
  Filled 2022-09-12: qty 30, 10d supply, fill #0

## 2022-09-12 NOTE — Patient Instructions (Signed)
Please review the attached list of medications and notify my office if there are any errors.   Start back on Ozempic 0.25mg  for two weeks, then increase to 0.5mg  . Let me know if you break out in a rash or have any worsening of the sweling in your legs.

## 2022-09-13 LAB — RENAL FUNCTION PANEL
Albumin: 3.9 g/dL (ref 3.9–4.9)
BUN/Creatinine Ratio: 26 — ABNORMAL HIGH (ref 10–24)
BUN: 38 mg/dL — ABNORMAL HIGH (ref 8–27)
CO2: 22 mmol/L (ref 20–29)
Calcium: 9.9 mg/dL (ref 8.6–10.2)
Chloride: 92 mmol/L — ABNORMAL LOW (ref 96–106)
Creatinine, Ser: 1.48 mg/dL — ABNORMAL HIGH (ref 0.76–1.27)
Glucose: 322 mg/dL — ABNORMAL HIGH (ref 70–99)
Phosphorus: 4.3 mg/dL — ABNORMAL HIGH (ref 2.8–4.1)
Potassium: 5 mmol/L (ref 3.5–5.2)
Sodium: 135 mmol/L (ref 134–144)
eGFR: 52 mL/min/{1.73_m2} — ABNORMAL LOW (ref >59)

## 2022-09-19 NOTE — Progress Notes (Signed)
Established patient visit   Patient: Curtis [name]   DOB: March 11, 1955   67 y.o. Male  MRN: [id number] Visit Date: 09/12/2022  Today's healthcare provider: Mila Merry, MD   Chief Complaint  Patient presents with   Medical Management of Chronic Issues   Subjective    Discussed the use of AI scribe software for clinical note transcription with the patient, who gave verbal consent to proceed.  History of Present Illness   The patient, with a history of leg swelling and diabetes, presents with significant improvement in leg condition. He reports that the swelling in his legs has reduced considerably, with visible knees and ankles. This improvement was noticed after the application of Vaseline, gauze wrap, and elastic bandage, as previously advised. He also completed a course of antibiotics, which he believes contributed to the improvement.  Despite the reduction in swelling, the patient reports that his legs feel extremely heavy, likening them to 'weighing seven hundred pounds.' He also mentions that his blood sugar levels have been around 170-180 since discontinuation of a certain medication.  The patient has been taking metolazone, a diuretic, which he believes has helped reduce the fluid in his legs. He also reports a history of consuming Gatorade, unsweetened tea, and diet coke regularly.  The patient's leg condition improved significantly after the application of pads with jelly, which he found surprising. He also mentions a history of cholesterol issues, which he understands will not improve until his sugar levels are controlled.       Medications: Outpatient Medications Prior to Visit  Medication Sig   acetaminophen (TYLENOL) 500 MG tablet Take 2 tablets (1,000 mg total) by mouth every 6 (six) hours.   aspirin 81 MG chewable tablet Chew 1 tablet (81 mg total) by mouth daily.   atorvastatin (LIPITOR) 80 MG tablet Take 1 tablet (80 mg total) by mouth daily.   empagliflozin  (JARDIANCE) 10 MG TABS tablet Take 1 tablet (10 mg total) by mouth daily before breakfast.   famotidine (PEPCID) 40 MG tablet Take 1 tablet (40 mg total) by mouth daily.   furosemide (LASIX) 20 MG tablet Take 2 tablets (40 mg total) by mouth daily.   glipiZIDE (GLUCOTROL) 10 MG tablet Take 1 tablet (10 mg total) by mouth 2 (two) times daily before a meal.   HYDROcodone-acetaminophen (NORCO/VICODIN) 5-325 MG tablet Take 1 tablet by mouth every 6 (six) hours as needed for moderate pain.   Lancet Devices MISC Use to check blood sugar twice a day   metFORMIN (GLUCOPHAGE-XR) 500 MG 24 hr tablet Take 1 tablet (500 mg total) by mouth daily.   metolazone (ZAROXOLYN) 2.5 MG tablet Take 1 tablet (2.5 mg total) by mouth daily.   metoprolol succinate (TOPROL-XL) 50 MG 24 hr tablet Take 1 tablet (50 mg total) by mouth daily.   sacubitril-valsartan (ENTRESTO) 97-103 MG TAKE 1 TABLET BY MOUTH 2 (TWO) TIMES DAILY.   Semaglutide,0.25 or 0.5MG /DOS, (OZEMPIC, 0.25 OR 0.5 MG/DOSE,) 2 MG/3ML SOPN Inject 0.5 mg into the skin once a week.   silver sulfADIAZINE (SILVADENE) 1 % cream Apply 1 Application topically daily.   spironolactone (ALDACTONE) 25 MG tablet Take 1 tablet (25 mg total) by mouth daily. ON HOLD DUE TO HYPERKALEMIA   No facility-administered medications prior to visit.   Review of Systems     Objective    BP 135/65 (BP Location: Left Arm, Patient Position: Sitting, Cuff Size: Normal)   Pulse 76   Temp 97.6 F (36.4  C) (Temporal)   Resp 12   Ht 5\' 8"  (1.727 m)   Wt 173 lb (78.5 kg)   BMI 26.30 kg/m   Physical Exam   General: Appearance:    Well developed, well nourished male in no acute distress  Eyes:    PERRL, conjunctiva/corneas clear, EOM's intact       Lungs:     Clear to auscultation bilaterally, respirations unlabored  Heart:    Normal heart rate. Normal rhythm. No murmurs, rubs, or gallops.    MS:   All extremities are intact.  1+ edema bilaterally, greatly improved from last  visit, no erythema.   Neurologic:   Awake, alert, oriented x 3. No apparent focal neurological defect.         Assessment & Plan        Lower Extremity Edema  Significant improvement with Vaseline, gauze wrap, and elastic bandage. Metolazone also likely contributed to fluid reduction. Legs feel heavy, possibly due to disuse. -Continue current regimen of Vaseline, gauze wrap, and elastic bandage. -Continue Metolazone daily. -Check kidney function and electrolytes to ensure Metolazone is not causing depletion.  Diabetes Mellitus Blood glucose levels around 170-180. Recently stopped Ozempic due to concern about leg swelling, but plan to restart as leg swelling likely not related to Ozempic. -Restart Ozempic at 0.25mg  for two doses, then increase to 0.5mg . -Check A1C once on a steady regimen of Ozempic. -Contact office if leg swelling recurs after restarting Ozempic.  Antibiotic Therapy Recently completed a course of antibiotics, likely contributing to improvement in leg condition. -Continue antibiotics for another week.  General Health Maintenance -Check cholesterol once blood glucose levels are better controlled. -Continue current diet and hydration practices, including unsweetened tea, diet coke, and occasional Gatorade.    No follow-ups on file.      Mila Merry, MD  [facility] [phone] (phone) [phone] (fax)  [facility]

## 2022-09-23 ENCOUNTER — Other Ambulatory Visit (HOSPITAL_COMMUNITY): Payer: Self-pay

## 2022-09-29 ENCOUNTER — Telehealth: Payer: Self-pay

## 2022-09-29 NOTE — Telephone Encounter (Signed)
Hi Gentry Fitz,   Was wondering if you knew when we should expect Mr. Sanabia 1mg  Ozempic to arrive from the pharmaceutical company. We've been covering him with samples of the 0.5 which he is about out of,  but still haven't received any of the 1mg  from patient assistance.  Thank you!  Dr. Carmon Ginsberg

## 2022-09-29 NOTE — Telephone Encounter (Signed)
Spoke with wife and she states that patient has developed the blisters on his legs just like he had when he got to the 0.5 dose before.  After speaking with Dr Sherrie Mustache patient is advised to discontinue the Ozempic

## 2022-10-01 ENCOUNTER — Other Ambulatory Visit: Payer: Self-pay | Admitting: Pharmacist

## 2022-10-01 NOTE — Progress Notes (Signed)
10/01/2022 Name: Curtis [name] MRN: [id number] DOB: 12-23-1955  Chief Complaint  Patient presents with   Medication Management   Medication Assistance    Curtis [name] is a 67 y.o. year old male who was referred to the pharmacist by their PCP for assistance in managing medication access.    Speak with patient's spouse, Curtis [name] (listed on DPR in chart), and patient today.    Subjective:  Care Team: Primary Care Provider: Malva Limes, MD; Next Scheduled Visit: 11/14/2022 Cardiologist: Curtis Patty, MD  Medication Access/Adherence  Current Pharmacy:  [facility] [address], [place] - [address] [place] [address] Phone: [phone] Fax: [phone]  [facility] [address] [place] [address] Phone: [phone] Fax: [phone]   Patient reports affordability concerns with their medications: No Patient reports access/transportation concerns to their pharmacy: No  Patient reports adherence concerns with their medications:  No    Reports current medications affordable now that patient engaging with [facility] at [facility]  From review of chart, note patient advised by PCP on 09/29/2022 to stop Ozempic due to repeat swelling/blistering on his legs potentially related to this medication    Diabetes:   Current medications:  - glipizide 10 mg twice daily before meals - metformin ER 500 mg daily - Jardiance 10 mg daily before breakfast - Stopped Ozempic as discussed with PCP - last dose taken on 9/8  Previous therapies tried: Curtis [name] (cost)   Current glucose readings: denies checking since a couple of days ago when prior to meal reading was 160    Denies hypoglycemic s/sx including dizziness, shakiness, sweating.    Current physical activity: limited due to disability (in wheelchair) - limited to transferring     Current medication access support:  - Enrolled in [employer] Patient assistance program for Jardiance through 01/20/2023 ([facility] supported with application) - Enrolled in [employer] patient assistance program through 01/20/2023     Hyperlipidemia/ASCVD Risk Reduction   Current lipid lowering medications: atorvastatin 80 mg daily Medications tried in the past:    Antiplatelet regimen: aspirin 81 mg   Current physical activity: limited due to disability (in wheelchair) - limited to transferring    Current medication access support: none   Objective:  Lab Results  Component Value Date   HGBA1C 9.1 (A) 06/06/2022    Lab Results  Component Value Date   CREATININE 1.48 (H) 09/12/2022   BUN 38 (H) 09/12/2022   NA 135 09/12/2022   K 5.0 09/12/2022   CL 92 (L) 09/12/2022   CO2 22 09/12/2022    Lab Results  Component Value Date   CHOL 175 06/06/2022   HDL 21 (L) 06/06/2022   LDLCALC Comment (A) 06/06/2022   LDLDIRECT 16.7 12/05/2020   TRIG 881 (HH) 06/06/2022   CHOLHDL 8.3 (H) 06/06/2022   BP Readings from Last 3 Encounters:  09/12/22 135/65  08/26/22 (!) 163/78  07/16/22 134/67   Pulse Readings from Last 3 Encounters:  09/12/22 76  08/26/22 78  07/16/22 88     Medications Reviewed Today     Reviewed by Manuela Neptune, RPH-CPP (Pharmacist) on 10/01/22 at 1358  Med List Status: <None>   Medication Order Taking? Sig Documenting Provider Last Dose Status Informant  acetaminophen (TYLENOL) 500 MG tablet [id number]  Take 2 tablets (1,000 mg total) by mouth every 6 (six) hours. Curtis Dory,  PA-C  Active   aspirin 81 MG chewable tablet [id number]  Chew 1 tablet (81 mg total) by mouth daily. Curtis Lis M, PA-C  Active   atorvastatin (LIPITOR) 80 MG tablet [id number]  Take 1 tablet (80 mg total) by mouth daily. Curtis Limes, MD  Active   empagliflozin (JARDIANCE) 10 MG TABS tablet [id number] Yes Take 1 tablet (10 mg total) by  mouth daily before breakfast. Bensimhon, Bevelyn Buckles, MD Taking Active   famotidine (PEPCID) 40 MG tablet [id number]  Take 1 tablet (40 mg total) by mouth daily. Curtis Limes, MD  Active   furosemide (LASIX) 20 MG tablet [id number]  Take 2 tablets (40 mg total) by mouth daily. Bensimhon, Bevelyn Buckles, MD  Active   glipiZIDE (GLUCOTROL) 10 MG tablet [id number] Yes Take 1 tablet (10 mg total) by mouth 2 (two) times daily before a meal. Curtis Limes, MD Taking Active   HYDROcodone-acetaminophen (NORCO/VICODIN) 5-325 MG tablet [id number]  Take 1 tablet by mouth every 6 (six) hours as needed for moderate pain. Curtis Limes, MD  Active   Lancet Devices MISC [id number]  Use to check blood sugar twice a day Curtis Limes, MD  Active Self  metFORMIN (GLUCOPHAGE-XR) 500 MG 24 hr tablet [id number] Yes Take 1 tablet (500 mg total) by mouth daily. Curtis Limes, MD Taking Active   metolazone (ZAROXOLYN) 2.5 MG tablet [id number]  Take 1 tablet (2.5 mg total) by mouth daily. Curtis Limes, MD  Active   metoprolol succinate (TOPROL-XL) 50 MG 24 hr tablet [id number]  Take 1 tablet (50 mg total) by mouth daily. Bensimhon, Bevelyn Buckles, MD  Active   sacubitril-valsartan (ENTRESTO) 97-103 MG [id number]  TAKE 1 TABLET BY MOUTH 2 (TWO) TIMES DAILY. Curtis [name], Anderson Malta, FNP  Active   Semaglutide,0.25 or 0.5MG /DOS, (OZEMPIC, 0.25 OR 0.5 MG/DOSE,) 2 MG/3ML SOPN [id number] No Inject 0.5 mg into the skin once a week.  Patient not taking: Reported on 10/01/2022   [provider] Not Taking Active   silver sulfADIAZINE (SILVADENE) 1 % cream [id number]  Apply 1 Application topically daily. Curtis Limes, MD  Active   spironolactone (ALDACTONE) 25 MG tablet [id number]  Take 1 tablet (25 mg total) by mouth daily. ON HOLD DUE TO Curtis Nordmann, MD  Active               Assessment/Plan:   Today recommend to contact office to regarding questions about the blistering on patient's legs/how to care for  these blisters   Have recommended outreach to the [employer] Information Program [facility] to schedule an appointment with a counselor for help with enrolling in a Medicare plan in the future - If needed, recommended patient/caregiver follow up with LCSW at [facility]   [name] states that she will contact [employer] to request patient assistance program hold off on further shipments of Ozempic for patient  Diabetes: - Reviewed long term cardiovascular and renal outcomes of uncontrolled blood sugar - Reviewed goal A1c, goal fasting, and goal 2 hour post prandial glucose - Reviewed dietary modifications including importance of having regular well-balanced meals and snacks while controlling carbohydrate portion sizes - Recommend to continue to check glucose, keep a log of the results and have this record to share with providers at upcoming medical appointments - Will collaborate with PCP regarding blood sugar control and medication management   Hyperlipidemia/ASCVD Risk Reduction: - Currently uncontrolled.  - Reviewed long term  complications of uncontrolled cholesterol - Patient unable to receive Vascepa at this time. Have collaborated with PCP regarding alternative hyperlipidemia medication management options  Requested provider recheck lipid panel, including a direct LDL level Recommended provider consider adding ezetimibe to the atorvastatin for LDL (but also some triglyceride) benefit    Or could consider fenofibrate as potential alternative for triglyceride control   Follow Up Plan: Clinical Pharmacist will follow up with patient by telephone on 11/12/2022 at 2:00 PM    Estelle Grumbles, PharmD, [facility] [phone]

## 2022-10-02 ENCOUNTER — Encounter: Payer: Self-pay | Admitting: Pharmacist

## 2022-10-02 NOTE — Telephone Encounter (Signed)
This encounter was created in error - please disregard.  This encounter was created in error - please disregard.

## 2022-10-02 NOTE — Patient Instructions (Signed)
Goals Addressed             This Visit's Progress    Pharmacy Goals       Our goal A1c is less than 7%. This corresponds with fasting sugars less than 130 and 2 hour after meal sugars less than 180. Please keep a log of your results when checking your blood sugar   Our goal bad cholesterol, or LDL, is less than 70. This is why it is important to continue taking your atorvastatin.    Also, you can find more information about the [employer] and Seniors' [employer] Information Program [facility]) here:   [email or web address]   You can reach the [facility] Counselor for [facility] by calling:   Toma Deiters [facility] [address]     [place]  [address] [phone]     Ask for help with [facility]     Thank you!    Estelle Grumbles, PharmD, [facility] [phone]

## 2022-10-10 ENCOUNTER — Other Ambulatory Visit (HOSPITAL_COMMUNITY): Payer: Self-pay

## 2022-10-15 ENCOUNTER — Other Ambulatory Visit: Payer: Self-pay

## 2022-10-15 ENCOUNTER — Ambulatory Visit (INDEPENDENT_AMBULATORY_CARE_PROVIDER_SITE_OTHER): Payer: Self-pay

## 2022-10-15 DIAGNOSIS — G8929 Other chronic pain: Secondary | ICD-10-CM

## 2022-10-15 DIAGNOSIS — Z23 Encounter for immunization: Secondary | ICD-10-CM

## 2022-10-15 NOTE — Telephone Encounter (Signed)
Patient also states that since running out of his diuretic he has started having new blisters again.  He is out of the diuretic and antibiotic and didn't know if he needed to get more.

## 2022-10-16 ENCOUNTER — Other Ambulatory Visit: Payer: Self-pay

## 2022-10-16 MED ORDER — HYDROCODONE-ACETAMINOPHEN 5-325 MG PO TABS
1.0000 | ORAL_TABLET | Freq: Four times a day (QID) | ORAL | 0 refills | Status: AC | PRN
Start: 2022-10-16 — End: ?
  Filled 2022-10-16: qty 30, 8d supply, fill #0

## 2022-10-17 ENCOUNTER — Other Ambulatory Visit: Payer: Self-pay

## 2022-10-17 DIAGNOSIS — G8929 Other chronic pain: Secondary | ICD-10-CM

## 2022-10-17 MED ORDER — HYDROCODONE-ACETAMINOPHEN 5-325 MG PO TABS
1.0000 | ORAL_TABLET | Freq: Four times a day (QID) | ORAL | 0 refills | Status: DC | PRN
Start: 2022-10-17 — End: 2023-01-06

## 2022-10-17 NOTE — Telephone Encounter (Signed)
Patient needs this to go to [facility] and not [facility]

## 2022-11-12 ENCOUNTER — Telehealth: Payer: Self-pay | Admitting: Pharmacist

## 2022-11-12 ENCOUNTER — Other Ambulatory Visit: Payer: Self-pay | Admitting: Pharmacist

## 2022-11-12 NOTE — Progress Notes (Signed)
Outreach Note  11/12/2022 Name: Curtis [name] MRN: [id number] DOB: [birth year]  Referred by: Malva Limes, MD  Was unable to reach patient via telephone today and have left HIPAA compliant voicemail asking patient to return my call.    Follow Up Plan: Will collaborate with Care Guide to outreach to schedule follow up with me  Estelle Grumbles, PharmD, [facility] [phone]

## 2022-11-13 ENCOUNTER — Other Ambulatory Visit (HOSPITAL_COMMUNITY): Payer: Self-pay

## 2022-11-14 ENCOUNTER — Ambulatory Visit (INDEPENDENT_AMBULATORY_CARE_PROVIDER_SITE_OTHER): Payer: Self-pay | Admitting: Family Medicine

## 2022-11-14 ENCOUNTER — Encounter: Payer: Self-pay | Admitting: Family Medicine

## 2022-11-14 ENCOUNTER — Other Ambulatory Visit: Payer: Self-pay

## 2022-11-14 DIAGNOSIS — L03119 Cellulitis of unspecified part of limb: Secondary | ICD-10-CM

## 2022-11-14 DIAGNOSIS — S80829A Blister (nonthermal), unspecified lower leg, initial encounter: Secondary | ICD-10-CM

## 2022-11-14 MED ORDER — SILVER SULFADIAZINE 1 % EX CREA
1.0000 | TOPICAL_CREAM | Freq: Every day | CUTANEOUS | 1 refills | Status: AC
  Filled 2022-11-14: qty 50, 50d supply, fill #0
  Filled 2023-10-27: qty 50, 50d supply, fill #1

## 2022-11-14 MED ORDER — CEPHALEXIN 500 MG PO CAPS
500.0000 mg | ORAL_CAPSULE | Freq: Three times a day (TID) | ORAL | 0 refills | Status: DC
  Filled 2022-11-14: qty 42, 14d supply, fill #0

## 2022-11-14 MED ORDER — METOLAZONE 2.5 MG PO TABS
2.5000 mg | ORAL_TABLET | Freq: Every day | ORAL | 3 refills | Status: DC
Start: 2022-11-14 — End: 2022-12-07
  Filled 2022-11-14: qty 30, 30d supply, fill #0

## 2022-11-14 NOTE — Patient Instructions (Signed)
.   Please review the attached list of medications and notify my office if there are any errors.   . Please bring all of your medications to every appointment so we can make sure that our medication list is the same as yours.   

## 2022-11-17 NOTE — Progress Notes (Signed)
Established patient visit   Patient: Curtis [name]   DOB: 10/26/1955   67 y.o. Male  MRN: [id number] Visit Date: 11/14/2022  Today's healthcare provider: Mila Merry, MD   Chief Complaint  Patient presents with   Medical Management of Chronic Issues    8 week follow-up. Wife reports blisters on both legs, she is having to keep them both wrapped and swelling is radiating further up his legs for the last few weeks. States things are not improving.    Subjective    Discussed the use of AI scribe software for clinical note transcription with the patient, who gave verbal consent to proceed.  History of Present Illness   The patient presents with worsening lower extremity edema. The edema has been associated with skin changes, including discoloration and the development of sores that are discharging a yellow substance. The patient's legs have been wrapped daily, but the swelling has continued to progress, extending to the feet. The patient's weight has also increased significantly, which he attributes to the fluid accumulation in the legs.  Had similar episode in August that stared immediately after titrating up his Ozempic. At that time Ozempic was held and he was started on abx and metolazone and had rapid resolution of sx. current episode also started after he had been back on Ozempic for a few weeks and uptitrated dose. Ozempic was put on hold again. However, it is also noted that he finished the metolazone at about the same time. He also reports increased urinary frequency and urgency, to the point where he has to stop multiple times during a short car journey to urinate. The volume of urine varies, sometimes being quite large. He continues furosemide (Lasix) 20mg  twice daily.  The patient's condition has caused significant discomfort, with sharp pains in the legs, and has impacted his mobility, as he is unable to wear regular shoes due to the swelling. The patient's blood pressure has  also been elevated, which may be related to the pain and discomfort from the edema.       Medications: Outpatient Medications Prior to Visit  Medication Sig   acetaminophen (TYLENOL) 500 MG tablet Take 2 tablets (1,000 mg total) by mouth every 6 (six) hours.   aspirin 81 MG chewable tablet Chew 1 tablet (81 mg total) by mouth daily.   atorvastatin (LIPITOR) 80 MG tablet Take 1 tablet (80 mg total) by mouth daily.   empagliflozin (JARDIANCE) 10 MG TABS tablet Take 1 tablet (10 mg total) by mouth daily before breakfast.   famotidine (PEPCID) 40 MG tablet Take 1 tablet (40 mg total) by mouth daily.   furosemide (LASIX) 20 MG tablet Take 2 tablets (40 mg total) by mouth daily.   glipiZIDE (GLUCOTROL) 10 MG tablet Take 1 tablet (10 mg total) by mouth 2 (two) times daily before a meal.   HYDROcodone-acetaminophen (NORCO/VICODIN) 5-325 MG tablet Take 1 tablet by mouth every 6 (six) hours as needed for moderate pain.   Lancet Devices MISC Use to check blood sugar twice a day   metFORMIN (GLUCOPHAGE-XR) 500 MG 24 hr tablet Take 1 tablet (500 mg total) by mouth daily.   metoprolol succinate (TOPROL-XL) 50 MG 24 hr tablet Take 1 tablet (50 mg total) by mouth daily.   sacubitril-valsartan (ENTRESTO) 97-103 MG TAKE 1 TABLET BY MOUTH 2 (TWO) TIMES DAILY.   Semaglutide,0.25 or 0.5MG /DOS, (OZEMPIC, 0.25 OR 0.5 MG/DOSE,) 2 MG/3ML SOPN Inject 0.5 mg into the skin once a week. (Patient  not taking: Reported on 10/01/2022)   spironolactone (ALDACTONE) 25 MG tablet Take 1 tablet (25 mg total) by mouth daily. ON HOLD DUE TO HYPERKALEMIA   [DISCONTINUED] metolazone (ZAROXOLYN) 2.5 MG tablet Take 1 tablet (2.5 mg total) by mouth daily.   [DISCONTINUED] silver sulfADIAZINE (SILVADENE) 1 % cream Apply 1 Application topically daily.   No facility-administered medications prior to visit.   Review of Systems  Constitutional:  Negative for appetite change, chills and fever.  Respiratory:  Negative for chest tightness,  shortness of breath and wheezing.   Cardiovascular:  Negative for chest pain and palpitations.  Gastrointestinal:  Negative for abdominal pain, nausea and vomiting.       Objective    BP (!) 196/81 (BP Location: Left Arm, Patient Position: Sitting, Cuff Size: Normal)   Pulse 63   Ht 5\' 8"  (1.727 m)   Wt 188 lb 11.2 oz (85.6 kg)   SpO2 100%   BMI 28.69 kg/m    Physical Exam   MEASUREMENTS: WT- 190 EXTREMITIES: Edema in legs and feet. SKIN: Sores with yellow discharge on legs, blotchy purple-red areas on legs.      Assessment & Plan        Lower Extremity Edema Worsening lower extremity edema with skin changes and possible infection. Previously attributed to increasing doses of Ozempic, but now appreciate that current episode started after finishing month long course of metolazone.  -Resume Metolazone 2.5mg  daily. -Start Cephalexin 500mg  for possible skin infection. -Continue Furosemide 20mg  twice daily. -Apply Silvadene cream to affected areas.  Urinary Frequency Increased urinary frequency with difficulty holding urine. No clear cause identified. -Monitor symptoms and reassess at follow-up.  Diabetes Management Patient was previously on Ozempic, but it was stopped due to concerns about possible side effects. Patient's legs worsened after stopping Ozempic, but this is likely coincidental and related to stopping Metolazone. -Consider restarting Ozempic 0.5mg  once lower extremity edema improves, pending availability of samples.  Follow-up in 2 weeks to reassess lower extremity edema, possible skin infection, and blood pressure.    Return in about 3 weeks (around 12/05/2022).      Mila Merry, MD  [facility] [phone] (phone) [phone] (fax)  [facility]

## 2022-11-19 ENCOUNTER — Telehealth: Payer: Self-pay

## 2022-11-19 NOTE — Progress Notes (Signed)
Care Coordination Note  11/19/2022 Name: Curtis [name] MRN: [id number] DOB: Oct 18, 1955  Curtis [name] is a 67 y.o. year old male who is a primary care patient of Sherrie Mustache, Demetrios Isaacs, MD and is actively engaged with the  Care Management team. I reached out to Cherre Robins by phone today to assist with re-scheduling a follow up visit with the Pharmacist  Follow up plan: Unsuccessful telephone outreach attempt made. A HIPAA compliant phone message was left for the patient providing contact information and requesting a return call.   Penne Lash, RMA Care Guide [facility]  [place], [place] [address] Direct Dial: [phone] [email or web address]

## 2022-11-25 ENCOUNTER — Other Ambulatory Visit: Payer: Self-pay

## 2022-12-03 ENCOUNTER — Telehealth: Payer: Self-pay | Admitting: Family Medicine

## 2022-12-03 NOTE — Telephone Encounter (Signed)
Please advise patient that we have one Ozempic 0.25/0.5mg  pen sample for him in the fridge.

## 2022-12-03 NOTE — Telephone Encounter (Signed)
 LMTCB-Ok for [facility] Nurse to give message

## 2022-12-05 ENCOUNTER — Other Ambulatory Visit: Payer: Self-pay

## 2022-12-05 ENCOUNTER — Ambulatory Visit (INDEPENDENT_AMBULATORY_CARE_PROVIDER_SITE_OTHER): Payer: Self-pay | Admitting: Family Medicine

## 2022-12-05 ENCOUNTER — Encounter: Payer: Self-pay | Admitting: Family Medicine

## 2022-12-05 VITALS — BP 121/64 | HR 62 | Temp 97.8°F | Ht 68.0 in | Wt 172.0 lb

## 2022-12-05 DIAGNOSIS — L03119 Cellulitis of unspecified part of limb: Secondary | ICD-10-CM

## 2022-12-05 DIAGNOSIS — S80821D Blister (nonthermal), right lower leg, subsequent encounter: Secondary | ICD-10-CM

## 2022-12-05 DIAGNOSIS — R609 Edema, unspecified: Secondary | ICD-10-CM

## 2022-12-05 DIAGNOSIS — S80829A Blister (nonthermal), unspecified lower leg, initial encounter: Secondary | ICD-10-CM

## 2022-12-05 DIAGNOSIS — E1165 Type 2 diabetes mellitus with hyperglycemia: Secondary | ICD-10-CM

## 2022-12-05 MED ORDER — CEPHALEXIN 500 MG PO CAPS
500.0000 mg | ORAL_CAPSULE | Freq: Three times a day (TID) | ORAL | 0 refills | Status: DC
  Filled 2022-12-05: qty 21, 7d supply, fill #0

## 2022-12-05 NOTE — Progress Notes (Signed)
Established patient visit   Patient: Curtis [name]   DOB: Feb 05, 1955   67 y.o. Male  MRN: [id number] Visit Date: 12/05/2022  Today's healthcare provider: Mila Merry, MD   Chief Complaint  Patient presents with   Follow-up    Pt stated--both legs are looking better.   Subjective    HPI Discussed the use of AI scribe software for clinical note transcription with the patient, who gave verbal consent to proceed.  History of Present Illness   The patient, with a history of diabetes, presents for follow up welts and swelling of feet and cellulitis.  He had developed large water blisters on the top of his feet, which have since reduced dramatically since starting back on metolozone and getting back on cephalexin. The sores on the back of the feet were severe but are now healing and flaking.  The patient's blood sugar levels have risen since discontinuing Ozempic. He had stopped the medication due to concerns about coinciding health issues but is now considering resuming it.   The patient's medications are primarily obtained from a hospital pharmacy, with the exception of hydrocodone, which is sourced from [employer]. The patient's overall health appears to be improving, but there are ongoing concerns about blood sugar levels and the healing of the foot sores.     Wt Readings from Last 3 Encounters:  12/05/22 172 lb (78 kg)  11/14/22 188 lb 11.2 oz (85.6 kg)  09/12/22 173 lb (78.5 kg)     Medications: Outpatient Medications Prior to Visit  Medication Sig   acetaminophen (TYLENOL) 500 MG tablet Take 2 tablets (1,000 mg total) by mouth every 6 (six) hours.   aspirin 81 MG chewable tablet Chew 1 tablet (81 mg total) by mouth daily.   atorvastatin (LIPITOR) 80 MG tablet Take 1 tablet (80 mg total) by mouth daily.   empagliflozin (JARDIANCE) 10 MG TABS tablet Take 1 tablet (10 mg total) by mouth daily before breakfast.   famotidine (PEPCID) 40 MG tablet Take 1 tablet (40 mg  total) by mouth daily.   furosemide (LASIX) 20 MG tablet Take 2 tablets (40 mg total) by mouth daily.   glipiZIDE (GLUCOTROL) 10 MG tablet Take 1 tablet (10 mg total) by mouth 2 (two) times daily before a meal.   HYDROcodone-acetaminophen (NORCO/VICODIN) 5-325 MG tablet Take 1 tablet by mouth every 6 (six) hours as needed for moderate pain.   Lancet Devices MISC Use to check blood sugar twice a day   metFORMIN (GLUCOPHAGE-XR) 500 MG 24 hr tablet Take 1 tablet (500 mg total) by mouth daily.   metolazone (ZAROXOLYN) 2.5 MG tablet Take 1 tablet (2.5 mg total) by mouth daily.   metoprolol succinate (TOPROL-XL) 50 MG 24 hr tablet Take 1 tablet (50 mg total) by mouth daily.   sacubitril-valsartan (ENTRESTO) 97-103 MG TAKE 1 TABLET BY MOUTH 2 (TWO) TIMES DAILY.   Semaglutide,0.25 or 0.5MG /DOS, (OZEMPIC, 0.25 OR 0.5 MG/DOSE,) 2 MG/3ML SOPN Inject 0.5 mg into the skin once a week. (Patient not taking: Reported on 10/01/2022)   silver sulfADIAZINE (SILVADENE) 1 % cream Apply 1 Application topically daily.   spironolactone (ALDACTONE) 25 MG tablet Take 1 tablet (25 mg total) by mouth daily. ON HOLD DUE TO HYPERKALEMIA   No facility-administered medications prior to visit.    Review of Systems  Constitutional:  Negative for appetite change, chills and fever.  Respiratory:  Negative for chest tightness, shortness of breath and wheezing.   Cardiovascular:  Negative for  chest pain and palpitations.  Gastrointestinal:  Negative for abdominal pain, nausea and vomiting.       Objective    BP 121/64 (BP Location: Right Arm, Patient Position: Sitting, Cuff Size: Normal)   Pulse 62   Temp 97.8 F (36.6 C)   Ht 5\' 8"  (1.727 m)   Wt 172 lb (78 kg)   SpO2 97%   BMI 26.15 kg/m    Physical Exam  Trace bilateral pedal and lower leg edema. Several healing scabs on backs of legs. Slightly erythematous on front of lower legs. No blistering lesions.   Assessment & Plan    . 1. Edema, unspecified type Much  improved since starting back on metolazone and taking another round of cephalexin.   2. Blister (nonthermal), unspecified lower leg, initial encounter Resolved since swelling has gone down.   Check renal functions, if stable will stay on metolazone chronically.   3. Cellulitis of lower extremity, unspecified laterality Nearly resolved. refill cephALEXin (KEFLEX) 500 MG capsule; Take 1 capsule (500 mg total) by mouth 3 (three) times daily for 7 days.  Dispense: 21 capsule; Refill: 0  4. Uncontrolled type 2 diabetes mellitus with hyperglycemia (HCC) Start back on Ozempic 0.25mg  weekly x 2-4 weeks, then increase 0.5mg . Is enrolled in patient assistance and will verify that the 1mg  Ozempic is being sent.   - Renal function panel  Return in about 12 weeks (around 02/27/2023).         Mila Merry, MD  [facility] [phone] (phone) [phone] (fax)  [facility]

## 2022-12-05 NOTE — Patient Instructions (Signed)
.   Please review the attached list of medications and notify my office if there are any errors.   . Please bring all of your medications to every appointment so we can make sure that our medication list is the same as yours.   

## 2022-12-06 LAB — RENAL FUNCTION PANEL
Albumin: 4 g/dL (ref 3.9–4.9)
BUN/Creatinine Ratio: 28 — ABNORMAL HIGH (ref 10–24)
BUN: 50 mg/dL — ABNORMAL HIGH (ref 8–27)
CO2: 25 mmol/L (ref 20–29)
Calcium: 9.6 mg/dL (ref 8.6–10.2)
Chloride: 92 mmol/L — ABNORMAL LOW (ref 96–106)
Creatinine, Ser: 1.81 mg/dL — ABNORMAL HIGH (ref 0.76–1.27)
Glucose: 269 mg/dL — ABNORMAL HIGH (ref 70–99)
Phosphorus: 5.2 mg/dL — ABNORMAL HIGH (ref 2.8–4.1)
Potassium: 4.6 mmol/L (ref 3.5–5.2)
Sodium: 135 mmol/L (ref 134–144)
eGFR: 40 mL/min/{1.73_m2} — ABNORMAL LOW (ref >59)

## 2022-12-07 ENCOUNTER — Other Ambulatory Visit: Payer: Self-pay | Admitting: Family Medicine

## 2022-12-07 DIAGNOSIS — S80829A Blister (nonthermal), unspecified lower leg, initial encounter: Secondary | ICD-10-CM

## 2022-12-07 MED ORDER — METOLAZONE 2.5 MG PO TABS
2.5000 mg | ORAL_TABLET | Freq: Every day | ORAL | 1 refills | Status: DC
  Filled 2022-12-07: qty 90, 90d supply, fill #0
  Filled 2023-03-10: qty 90, 90d supply, fill #1

## 2022-12-08 ENCOUNTER — Other Ambulatory Visit: Payer: Self-pay

## 2022-12-15 ENCOUNTER — Other Ambulatory Visit (HOSPITAL_COMMUNITY): Payer: Self-pay

## 2022-12-15 ENCOUNTER — Other Ambulatory Visit: Payer: Self-pay

## 2022-12-23 ENCOUNTER — Other Ambulatory Visit (HOSPITAL_COMMUNITY): Payer: Self-pay | Admitting: Cardiology

## 2022-12-23 MED ORDER — EMPAGLIFLOZIN 10 MG PO TABS: 10.0000 mg | ORAL_TABLET | Freq: Every day | ORAL | 3 refills | Status: DC

## 2022-12-23 NOTE — Telephone Encounter (Signed)
PER PHARM TEAM RX NEEDED FOR PAP

## 2022-12-24 ENCOUNTER — Other Ambulatory Visit: Payer: Self-pay | Admitting: Family Medicine

## 2022-12-24 ENCOUNTER — Other Ambulatory Visit: Payer: Self-pay

## 2022-12-24 DIAGNOSIS — E1165 Type 2 diabetes mellitus with hyperglycemia: Secondary | ICD-10-CM

## 2022-12-24 MED ORDER — SEMAGLUTIDE (1 MG/DOSE) 4 MG/3ML ~~LOC~~ SOPN
1.0000 mg | PEN_INJECTOR | SUBCUTANEOUS | 3 refills | Status: DC
  Filled 2022-12-24: qty 3, 28d supply, fill #0

## 2022-12-25 ENCOUNTER — Other Ambulatory Visit: Payer: Self-pay

## 2022-12-25 ENCOUNTER — Telehealth: Payer: Self-pay | Admitting: Family Medicine

## 2022-12-25 NOTE — Telephone Encounter (Signed)
Recieved a fax from covermymeds for Ozempic (1MG /DOSE) 4MG /3ML Pen-injectors has been started.   key: [id number]

## 2022-12-25 NOTE — Telephone Encounter (Signed)
 Information regarding your request  Your PA request cannot be processed for the member plan submitted. For further inquiries please contact the number on the back of the member prescription card.

## 2022-12-30 ENCOUNTER — Telehealth: Payer: Self-pay | Admitting: Family Medicine

## 2022-12-30 NOTE — Telephone Encounter (Signed)
Please advise patient that we have another sample pen of 0.25/0.5 dose pen Ozempic that he can pick up. It looks like its going to be a few more weeks before we can patient assistance to send the 1mg  pen. Sample is in the fridge.

## 2022-12-31 NOTE — Telephone Encounter (Signed)
Pt wife advised. Verbalized understanding states they will be by either today or tomorrow

## 2023-01-05 ENCOUNTER — Telehealth (HOSPITAL_COMMUNITY): Payer: Self-pay

## 2023-01-05 NOTE — Telephone Encounter (Signed)
Advanced Heart Failure Patient Advocate Encounter  Application for Entresto faxed to [employer] on 01/05/2023. Application form attached to patient chart.  Curtis [name], CPhT Rx Patient Advocate Phone: [phone]

## 2023-01-05 NOTE — Telephone Encounter (Signed)
Advanced Heart Failure Patient Advocate Encounter  Application for Jardiance faxed to [facility] on 01/05/2023. Application form attached to patient chart.  Burnell Blanks, CPhT Rx Patient Advocate Phone: [phone]

## 2023-01-06 ENCOUNTER — Other Ambulatory Visit: Payer: Self-pay | Admitting: Family Medicine

## 2023-01-06 DIAGNOSIS — G8929 Other chronic pain: Secondary | ICD-10-CM

## 2023-01-06 NOTE — Telephone Encounter (Signed)
Requested medication (s) are due for refill today: Yes  Requested medication (s) are on the active medication list: Yes  Last refill:  10/17/22 #30, 0RF  Future visit scheduled: Yes  Notes to clinic:  Unable to refill per protocol, cannot delegate.      Requested Prescriptions  Pending Prescriptions Disp Refills   HYDROcodone-acetaminophen (NORCO/VICODIN) 5-325 MG tablet 30 tablet 0    Sig: Take 1 tablet by mouth every 6 (six) hours as needed for moderate pain (pain score 4-6).     Not Delegated - Analgesics:  Opioid Agonist Combinations Failed - 01/06/2023  4:53 PM      Failed - This refill cannot be delegated      Failed - Urine Drug Screen completed in last 360 days      Passed - Valid encounter within last 3 months    Recent Outpatient Visits           1 month ago Edema, unspecified type   [place] [facility] Malva Limes, MD   1 month ago Blister (nonthermal), unspecified lower leg, initial encounter   [facility] Malva Limes, MD   3 months ago HFrEF (heart failure with reduced ejection fraction) [facility])   [place] [facility] Malva Limes, MD   4 months ago Blister (nonthermal), unspecified lower leg, initial encounter   [facility] Malva Limes, MD   5 months ago Upper respiratory tract infection, unspecified type   [facility] [place] [place], PA-C       Future Appointments             In 1 month Fisher, Demetrios Isaacs, MD [facility]

## 2023-01-06 NOTE — Telephone Encounter (Signed)
Medication Refill -  Most Recent Primary Care Visit:  Provider: Malva Limes  Department: [facility]  Visit Type: OFFICE VISIT  Date: 12/05/2022  Medication:  HYDROcodone-acetaminophen (NORCO/VICODIN) 5-325 MG tablet   Has the patient contacted their pharmacy? No (Agent: If no, request that the patient contact the pharmacy for the refill. If patient does not wish to contact the pharmacy document the reason why and proceed with request.) (Agent: If yes, when and what did the pharmacy advise?)  Is this the correct pharmacy for this prescription? Yes If no, delete pharmacy and type the correct one.  This is the patient's preferred pharmacy:  [id number] - [place], [place] - [address] AT [facility] OF [address] [place] [address] Phone: [phone] Fax: [phone]   Has the prescription been filled recently? Yes  Is the patient out of the medication? Yes  Has the patient been seen for an appointment in the last year OR does the patient have an upcoming appointment? Yes  Can we respond through MyChart? Yes  Agent: Please be advised that Rx refills may take up to 3 business days. We ask that you follow-up with your pharmacy.

## 2023-01-07 MED ORDER — HYDROCODONE-ACETAMINOPHEN 5-325 MG PO TABS: 1.0000 | ORAL_TABLET | Freq: Four times a day (QID) | ORAL | 0 refills | Status: DC | PRN

## 2023-01-09 ENCOUNTER — Other Ambulatory Visit (HOSPITAL_COMMUNITY): Payer: Self-pay

## 2023-01-19 NOTE — Telephone Encounter (Signed)
Patient was approved to receive Jardiance from [facility] Effective 01/18/2023 to 01/08/2024

## 2023-01-26 ENCOUNTER — Other Ambulatory Visit: Payer: Self-pay | Admitting: Pharmacist

## 2023-01-26 NOTE — Progress Notes (Signed)
 01/27/2023 Name: Curtis [name] MRN: [id number] DOB: [birth year]  Chief Complaint  Patient presents with   Medication Management   Hyperlipidemia   Diabetes    Curtis [name] is a 68 y.o. year old male who presented for a telephone visit.   They were referred to the pharmacist by their PCP for assistance in managing diabetes and medication access.    Subjective:  Care Team: Primary Care Provider: [name], MD ; Next Scheduled Visit: 02/27/23 Clinical Pharmacist: Aloysius Breeding, PharmD  Medication Access/Adherence  Current Pharmacy:  [facility] [address] [place] [address] Phone: [phone] Fax: [phone]  [facility] [place], [place] - [address] [place] [address] Phone: [phone] Fax: [phone]  [id number] [place], [place] - [address] AT [facility] OF [address] [place] [address] [place] [address] Phone: [phone] Fax: [phone]  [place] [facility] [address] [place] [address] Phone: [phone] Fax: [phone]   Patient reports affordability concerns with their medications: Yes  Only has Worker's Comp to pay for monthly check (instead of disability) + pain medication Patient reports access/transportation concerns to their pharmacy: No  Patient reports adherence concerns with their medications:  No     Diabetes:  Current medications: Ozempic  0.5mg  weekly (samples), Jardiance  10mg  daily, Metformin  500mg  1 tablet daily Medications tried in the past: Unknown  Current glucose readings: ~190 on average Using meter; testing 1 times daily  Patient denies hypoglycemic s/sx including dizziness, shakiness, sweating. Patient denies hyperglycemic symptoms including polyuria, polydipsia, polyphagia, nocturia, neuropathy, blurred vision.  Current physical activity: None- wheelchair bound since accident on  right side  Current medication access support: Worker's comp only- pay for visits + all other meds (besides pain med) on their own   Hyperlipidemia/ASCVD Risk Reduction  Current lipid lowering medications: Atorvastatin  80mg  daily Medications tried in the past: Unknown  Antiplatelet regimen: Aspirin   ASCVD History: CABG x 5 in 2021 Family History: Unknown Risk Factors: Lipid panel, sedentary lifestyle due to disability  The 10-year ASCVD risk score (Arnett DK, et al., 2019) is: 35.4%   Values used to calculate the score:     Age: 12 years     Sex: Male     Is Non-Hispanic African American: No     Diabetic: Yes     Tobacco smoker: No     Systolic Blood Pressure: 121 mmHg     Is BP treated: No     HDL Cholesterol: 21 mg/dL     Total Cholesterol: 175 mg/dL   Medication Management:  Patient reports Good adherence to medications  Recent fill dates:   [facility]): Atorvastatin  80mg  11/25/22 90DS Glipizide  10mg  11/25/22 90DS Metformin  500mg  11/25/22 90DS   Metolazone  2.5mg  12/08/22 90DS- paid $28.40 (not covered under DOH or HF fund) *Cheapest option with coupons/GoodRx  [facility] (HF Fund): Furosemide  20mg  01/09/23 30DS Toprol  XL 50mg  01/09/23 30DS  [facility] in [place]: Hydro-Aceta 5-325mg  12/18 7DS as needed  Entresto , Ozempic , Jardiance = PAP   Objective:  Lab Results  Component Value Date   HGBA1C 9.1 (A) 06/06/2022    Lab Results  Component Value Date   CREATININE 1.81 (H) 12/05/2022   BUN 50 (H) 12/05/2022   NA 135 12/05/2022   K 4.6 12/05/2022   CL 92 (L) 12/05/2022   CO2 25 12/05/2022    Lab  Results  Component Value Date   CHOL 175 06/06/2022   HDL 21 (L) 06/06/2022   LDLCALC Comment (A) 06/06/2022   LDLDIRECT 16.7 12/05/2020   TRIG 881 (HH) 06/06/2022   CHOLHDL 8.3 (H) 06/06/2022    Medications Reviewed Today   Medications were not reviewed in this encounter       Assessment/Plan:   Diabetes: - Currently  uncontrolled - Reviewed long term cardiovascular and renal outcomes of uncontrolled blood sugar - Reviewed goal A1c, goal fasting, and goal 2 hour post prandial glucose - Recommend to strive for FBG goal of <130  - Patient denies personal or family history of multiple endocrine neoplasia type 2, medullary thyroid cancer; personal history of pancreatitis or gallbladder disease. - Recommend to check glucose daily     Hyperlipidemia/ASCVD Risk Reduction: - Currently uncontrolled.  - Reviewed long term complications of uncontrolled cholesterol    Medication Management: - Currently strategy sufficient to maintain appropriate adherence to prescribed medication regimen - Suggested use of weekly pill box to organize medications - Need to determine if cost set-up is best as is- a lot of polypharmacy occurring   Follow Up Plan:  - For follow-up, set task to review chart to see about next steps after visit with PCP on 2/7 (check 2/14) - Ask Dr. Gasper about Fenofibrate + Zetia due to inability to get Vascepa due to lack of Medicare coverage  - Could get Fenofibrate 145mg  from [facility]  - Ezetimibe likely covered under HF Fund and [employer] 90DS is $22.49 - Verify that patient can get Medicare + Worker's comp  - Worker's comp with cover majority and Medicare will act as secondary insurance for anything leftover  - Recommend calling [phone] to sign-up for Medicare or enroll online  - Sent email to [facility] to see how they could assist in getting Medicare A/B coverage- see below - Call Novo Nordisk regarding where shipment was sent to avoid future issues  - Can maybe send different dose to speed up process; currently using samples of Ozempic  0.5mg  weekly   SHIIP Email response:    Aloysius Breeding, PharmD [facility] Phone Number: [phone]

## 2023-01-27 ENCOUNTER — Other Ambulatory Visit (HOSPITAL_COMMUNITY): Payer: Self-pay

## 2023-01-28 ENCOUNTER — Telehealth: Payer: Self-pay | Admitting: Pharmacist

## 2023-01-28 DIAGNOSIS — E1165 Type 2 diabetes mellitus with hyperglycemia: Secondary | ICD-10-CM

## 2023-01-28 MED ORDER — SEMAGLUTIDE (1 MG/DOSE) 4 MG/3ML ~~LOC~~ SOPN: 1.0000 mg | PEN_INJECTOR | SUBCUTANEOUS | 0 refills | Status: DC

## 2023-01-28 NOTE — Addendum Note (Signed)
 Addended by: Pollie Friar on: 01/28/2023 12:13 PM   Modules accepted: Orders

## 2023-01-28 NOTE — Progress Notes (Signed)
   01/28/2023  Patient ID: Curtis [name], male   DOB: Apr 18, 1955, 68 y.o.   MRN: [id number]  Patient returned call. Discussed what was listed in previous note.  Would prefer [facility] for voucher and advised I will handle this voucher. Will then call once handled as well as response from Dr. Gasper regarding Ozempic  dosing preferred.  Provided medicare Part A/B phone number as well. Reports she will discuss with Curtis about enrollment.    Aloysius Breeding, PharmD [facility] Phone Number: [phone]

## 2023-01-28 NOTE — Telephone Encounter (Signed)
  AttnPollie Friar, [facility]   Mrs. Roddey is returning your call. Please advise

## 2023-01-28 NOTE — Progress Notes (Addendum)
   01/28/2023  Patient ID: [name], male   DOB: 1955-01-30, 68 y.o.   MRN: [id number]  Tried calling the patient and spouse today. Unable to leave a voicemail on either option. Will try again in a few days.   Calling regarding: Ozempic  1mg  voucher for 1 month at pharmacy of their choice- [facility] is the closest option and next shipment of Ozempic  should be arriving between 1/22 to 1/28 hopefully If needed, I can show the patient how to count clicks to Ozempic  0.5mg  dosing on a 1mg  pen Need to call Medicare for Part A/B enrollment; SHIIP said it would work as secondary coverage to [employer] completed, then can enroll in [employer] for Vascepa; Dr. Gasper did NOT agree to adding Zetia or Fenofibrate currently due to lack of cardiovascular benefit     Aloysius Breeding, PharmD [facility] Phone Number: [phone]

## 2023-01-29 ENCOUNTER — Telehealth: Payer: Self-pay | Admitting: Family Medicine

## 2023-01-29 NOTE — Telephone Encounter (Signed)
 Covermymeds is requesting prior authorization Key: [id number] Ozempic 1MG /Dose 4mg /ML pen injectors

## 2023-01-29 NOTE — Progress Notes (Signed)
   01/29/2023  Patient ID: Curtis [name], male   DOB: [birth year], 68 y.o.   MRN: [id number]  Called and spoke with patient's wife on the phone today.  Advised to pick-up the Ozempic  1 mg prescription from the pharmacy so that it does NOT get put back again.  Finish out last 2 doses of the Ozempic  0.5mg  prescription on Tuesdays and then increase to 1mg  dose per Dr. Lyle instructions. Will allow him two weeks of 1mg  trial prior to next PCP visit on 02/27/23.   Confirmed understanding and aware to call the office if any further concerns prior to visit.   Aloysius Breeding, PharmD [facility] Phone Number: [phone]

## 2023-01-29 NOTE — Progress Notes (Signed)
   01/29/2023  Patient ID: Curtis [name], male   DOB: 1955-04-18, 68 y.o.   MRN: [id number]  [facility] and was able to get Ozempic  1mg  voucher processed for $0 at the pharmacy. Dr. Gasper also responded saying that he would like to increase to 1 mg at this point. Will assess outcoming at upcoming visit.   Aloysius Breeding, PharmD [facility] Phone Number: [phone]

## 2023-01-30 ENCOUNTER — Telehealth: Payer: Self-pay | Admitting: Pharmacist

## 2023-01-30 NOTE — Progress Notes (Signed)
   01/30/2023  Patient ID: Curtis [name], male   DOB: [birth year], 68 y.o.   MRN: [id number]  Received call from spouse today that they waited 1.5hrs in line at [facility] to get the Ozempic  prescription. When they got to the drive thru window it was said that the prescription was $0, but they are waiting on insurance to approve it. Spouse repeatedly told them that he does NOT have insurance and it doesn't make sense how it is $0 then.  Advised maybe they did NOT have the medication in-stock? Patient would prefer to get Ozempic  from [facility] on Monday is everything is resolved at that time. If not, we can switch it to [facility] pharmacy.  Called [facility] after we hung up and they confirmed they should be able to process the voucher on Monday if needed. [name] will call me on Monday to decide on next steps.    Aloysius Breeding, PharmD [facility] Phone Number: [phone]

## 2023-02-10 ENCOUNTER — Other Ambulatory Visit (HOSPITAL_COMMUNITY): Payer: Self-pay | Admitting: Internal Medicine

## 2023-02-10 ENCOUNTER — Other Ambulatory Visit (HOSPITAL_COMMUNITY): Payer: Self-pay

## 2023-02-10 DIAGNOSIS — I5020 Unspecified systolic (congestive) heart failure: Secondary | ICD-10-CM

## 2023-02-10 MED ORDER — FUROSEMIDE 20 MG PO TABS
40.0000 mg | ORAL_TABLET | Freq: Every day | ORAL | 0 refills | Status: DC
  Filled 2023-02-10: qty 60, 30d supply, fill #0

## 2023-02-16 ENCOUNTER — Other Ambulatory Visit: Payer: Self-pay | Admitting: Family Medicine

## 2023-02-16 ENCOUNTER — Other Ambulatory Visit (HOSPITAL_COMMUNITY): Payer: Self-pay | Admitting: Cardiology

## 2023-02-16 DIAGNOSIS — I5020 Unspecified systolic (congestive) heart failure: Secondary | ICD-10-CM

## 2023-02-16 DIAGNOSIS — G8929 Other chronic pain: Secondary | ICD-10-CM

## 2023-02-16 NOTE — Telephone Encounter (Signed)
Medication Refill -  Most Recent Primary Care Visit:  Provider: Malva Limes  Department: [facility]  Visit Type: OFFICE VISIT  Date: 12/05/2022  Medication: HYDROcodone-acetaminophen (NORCO/VICODIN) 5-325 MG     Has the patient contacted their pharmacy? No   Is this the correct pharmacy for this prescription? Yes  This is the patient's preferred pharmacy:  [id number] - [place], [place] - [address] AT [facility] [address] [place] [address] Phone: [phone] Fax: [phone]    Please advise the pharmacy that this is workers comp  Has the prescription been filled recently? Yes  Is the patient out of the medication? Yes  Has the patient been seen for an appointment in the last year OR does the patient have an upcoming appointment? Yes  Can we respond through MyChart? No  Agent: Please be advised that Rx refills may take up to 3 business days. We ask that you follow-up with your pharmacy.

## 2023-02-18 MED ORDER — HYDROCODONE-ACETAMINOPHEN 5-325 MG PO TABS
1.0000 | ORAL_TABLET | Freq: Four times a day (QID) | ORAL | 0 refills | Status: DC | PRN
Start: 2023-02-18 — End: 2023-04-16

## 2023-02-18 NOTE — Telephone Encounter (Signed)
Requested medication (s) are due for refill today - yes  Requested medication (s) are on the active medication list -yes  Future visit scheduled -yes  Last refill: 01/07/23 #30  Notes to clinic: non delegated Rx  Requested Prescriptions  Pending Prescriptions Disp Refills   HYDROcodone-acetaminophen (NORCO/VICODIN) 5-325 MG tablet 30 tablet 0    Sig: Take 1 tablet by mouth every 6 (six) hours as needed for moderate pain (pain score 4-6).     Not Delegated - Analgesics:  Opioid Agonist Combinations Failed - 02/18/2023  9:53 AM      Failed - This refill cannot be delegated      Failed - Urine Drug Screen completed in last 360 days      Passed - Valid encounter within last 3 months    Recent Outpatient Visits           2 months ago Edema, unspecified type   [facility] Malva Limes, MD   3 months ago Blister (nonthermal), unspecified lower leg, initial encounter   [facility] Malva Limes, MD   5 months ago HFrEF (heart failure with reduced ejection fraction) [facility])   [facility] Malva Limes, MD   5 months ago Blister (nonthermal), unspecified lower leg, initial encounter   [facility] Malva Limes, MD   7 months ago Upper respiratory tract infection, unspecified type   [facility] [place], [place], PA-C       Future Appointments             In 1 week Fisher, Demetrios Isaacs, MD [facility]               Requested Prescriptions  Pending Prescriptions Disp Refills   HYDROcodone-acetaminophen (NORCO/VICODIN) 5-325 MG tablet 30 tablet 0    Sig: Take 1 tablet by mouth every 6 (six) hours as needed for moderate pain (pain score 4-6).     Not Delegated - Analgesics:  Opioid Agonist Combinations Failed - 02/18/2023  9:53 AM      Failed - This refill cannot be delegated      Failed - Urine Drug  Screen completed in last 360 days      Passed - Valid encounter within last 3 months    Recent Outpatient Visits           2 months ago Edema, unspecified type   [facility] Malva Limes, MD   3 months ago Blister (nonthermal), unspecified lower leg, initial encounter   [facility] Malva Limes, MD   5 months ago HFrEF (heart failure with reduced ejection fraction) [facility])   [facility] Malva Limes, MD   5 months ago Blister (nonthermal), unspecified lower leg, initial encounter   [facility] Malva Limes, MD   7 months ago Upper respiratory tract infection, unspecified type   [facility], [place], PA-C       Future Appointments             In 1 week Fisher, Demetrios Isaacs, MD [facility]

## 2023-02-26 ENCOUNTER — Other Ambulatory Visit: Payer: Self-pay | Admitting: Family Medicine

## 2023-02-26 ENCOUNTER — Other Ambulatory Visit: Payer: Self-pay

## 2023-02-26 DIAGNOSIS — E1165 Type 2 diabetes mellitus with hyperglycemia: Secondary | ICD-10-CM

## 2023-02-27 ENCOUNTER — Telehealth: Payer: Self-pay | Admitting: Pharmacist

## 2023-02-27 ENCOUNTER — Ambulatory Visit: Payer: Self-pay | Admitting: Family Medicine

## 2023-02-27 ENCOUNTER — Other Ambulatory Visit: Payer: Self-pay

## 2023-02-27 ENCOUNTER — Telehealth: Payer: Self-pay | Admitting: Family Medicine

## 2023-02-27 ENCOUNTER — Encounter: Payer: Self-pay | Admitting: Family Medicine

## 2023-02-27 VITALS — BP 114/68 | HR 80 | Resp 16 | Ht 68.0 in | Wt 175.5 lb

## 2023-02-27 DIAGNOSIS — R609 Edema, unspecified: Secondary | ICD-10-CM

## 2023-02-27 DIAGNOSIS — E1165 Type 2 diabetes mellitus with hyperglycemia: Secondary | ICD-10-CM

## 2023-02-27 DIAGNOSIS — Z7984 Long term (current) use of oral hypoglycemic drugs: Secondary | ICD-10-CM

## 2023-02-27 LAB — POCT GLYCOSYLATED HEMOGLOBIN (HGB A1C)
Est. average glucose Bld gHb Est-mCnc: 258
Hemoglobin A1C: 10.6 % — AB (ref 4.0–5.6)

## 2023-02-27 MED ORDER — GLIPIZIDE 10 MG PO TABS
10.0000 mg | ORAL_TABLET | Freq: Two times a day (BID) | ORAL | 2 refills | Status: DC
  Filled 2023-02-27: qty 60, 30d supply, fill #0
  Filled 2023-03-30: qty 60, 30d supply, fill #1
  Filled 2023-04-29: qty 60, 30d supply, fill #2
  Filled 2023-05-29: qty 60, 30d supply, fill #3
  Filled 2023-07-03: qty 60, 30d supply, fill #4
  Filled 2023-08-04: qty 60, 30d supply, fill #5
  Filled 2023-09-04: qty 60, 30d supply, fill #6
  Filled 2023-09-29: qty 60, 30d supply, fill #7
  Filled 2023-11-11: qty 60, 30d supply, fill #8

## 2023-02-27 MED ORDER — SEMAGLUTIDE (1 MG/DOSE) 4 MG/3ML ~~LOC~~ SOPN: 1.0000 mg | PEN_INJECTOR | SUBCUTANEOUS | 0 refills | Status: DC

## 2023-02-27 NOTE — Progress Notes (Signed)
 Established patient visit   Patient: Curtis [name]   DOB: 1955-05-04   68 y.o. Male  MRN: [id number] Visit Date: 02/27/2023  Today's healthcare provider: Nancyann Perry, MD   Chief Complaint  Patient presents with   Medical Management of Chronic Issues   Subjective    HPI  Follow up diabetes. Finally got started on 1mg  semaglutide  last week. Morning sugars dropped from low 200s to 130s. Tolerating fairly well, although does report increase BM frequency. Is supposed to be getting long term supply from pharmaceutical patient assistance, but nothing has arrived yet.   Medications: Outpatient Medications Prior to Visit  Medication Sig   acetaminophen  (TYLENOL ) 500 MG tablet Take 2 tablets (1,000 mg total) by mouth every 6 (six) hours.   aspirin  81 MG chewable tablet Chew 1 tablet (81 mg total) by mouth daily.   atorvastatin  (LIPITOR) 80 MG tablet Take 1 tablet (80 mg total) by mouth daily.   empagliflozin  (JARDIANCE ) 10 MG TABS tablet Take 1 tablet (10 mg total) by mouth daily before breakfast.   famotidine  (PEPCID ) 40 MG tablet Take 1 tablet (40 mg total) by mouth daily.   furosemide  (LASIX ) 20 MG tablet Take 2 tablets (40 mg total) by mouth daily. NEEDS FOLLOW UP APPOINTMENT FOR MORE REFILLS   HYDROcodone -acetaminophen  (NORCO/VICODIN) 5-325 MG tablet Take 1 tablet by mouth every 6 (six) hours as needed for moderate pain (pain score 4-6).   Lancet Devices MISC Use to check blood sugar twice a day   metFORMIN  (GLUCOPHAGE -XR) 500 MG 24 hr tablet Take 1 tablet (500 mg total) by mouth daily.   metolazone  (ZAROXOLYN ) 2.5 MG tablet Take 1 tablet (2.5 mg total) by mouth daily.   metoprolol  succinate (TOPROL -XL) 50 MG 24 hr tablet Take 1 tablet (50 mg total) by mouth daily.   sacubitril -valsartan  (ENTRESTO ) 97-103 MG TAKE 1 TABLET BY MOUTH 2 (TWO) TIMES DAILY.   Semaglutide , 1 MG/DOSE, 4 MG/3ML SOPN Inject 1 mg as directed once a week.   silver  sulfADIAZINE  (SILVADENE ) 1 % cream Apply 1  Application topically daily.   spironolactone  (ALDACTONE ) 25 MG tablet Take 1 tablet (25 mg total) by mouth daily. ON HOLD DUE TO HYPERKALEMIA   [DISCONTINUED] glipiZIDE  (GLUCOTROL ) 10 MG tablet Take 1 tablet (10 mg total) by mouth 2 (two) times daily before a meal.   Semaglutide ,0.25 or 0.5MG /DOS, (OZEMPIC , 0.25 OR 0.5 MG/DOSE,) 2 MG/3ML SOPN Inject 0.5 mg into the skin once a week. (Patient not taking: Reported on 10/01/2022)   [DISCONTINUED] cephALEXin  (KEFLEX ) 500 MG capsule Take 1 capsule (500 mg total) by mouth 3 (three) times daily for 7 days.   No facility-administered medications prior to visit.    Review of Systems  Constitutional:  Negative for appetite change, chills and fever.  Respiratory:  Negative for chest tightness, shortness of breath and wheezing.   Cardiovascular:  Negative for chest pain and palpitations.  Gastrointestinal:  Negative for abdominal pain, nausea and vomiting.       Objective    BP 114/68 (BP Location: Right Arm, Patient Position: Sitting, Cuff Size: Normal)   Pulse 80   Resp 16   Ht 5' 8 (1.727 m)   Wt 175 lb 8 oz (79.6 kg)   SpO2 98%   BMI 26.68 kg/m    Physical Exam   General: Appearance:    Well developed, well nourished male in no acute distress  Eyes:    PERRL, conjunctiva/corneas clear, EOM's intact  Lungs:     Clear to auscultation bilaterally, respirations unlabored  Heart:    Normal heart rate. Normal rhythm. No murmurs, rubs, or gallops.    MS:   All extremities are intact.    Neurologic:   Awake, alert, oriented x 3. No apparent focal neurological defect.         Results for orders placed or performed in visit on 02/27/23  POCT glycosylated hemoglobin (Hb A1C)  Result Value Ref Range   Hemoglobin A1C 10.6 (A) 4.0 - 5.6 %   Est. average glucose Bld gHb Est-mCnc 258     Assessment & Plan     1. Uncontrolled type 2 diabetes mellitus with hyperglycemia (HCC) (Primary)   Back on Ozempic  and recently titrated up to 1mg   per week, fairly well tolerated. Will check on status of the 1mg  pens to be sent by patient assistance.   2. Lower extremity edema. Has resolved since adding scheduled doses of metolazone  to regiment.   Return in about 12 weeks (around 05/22/2023) for Diabetes.        Nancyann Perry, MD  [facility] [phone] (phone) [phone] (fax)  [facility]

## 2023-02-27 NOTE — Telephone Encounter (Signed)
 Received a fax from covermymeds for Ozempic  4mg /68ml  Key: [id number]

## 2023-02-27 NOTE — Progress Notes (Signed)
   02/27/2023  Patient ID: Curtis [name], male   DOB: 1955/12/26, 68 y.o.   MRN: [id number]  Called [employer] and received another voucher for Ozempic  1mg  for next month supply as they still have NOT sent the order.  ID: [id number] PCN: [id number] Grp: [id number] BIN: [id number]  Another refill for Ozempic  1mg  30DS sent to [facility] for processing. Will call tomorrow to ensure voucher processing alone.   Update from 03/01/23:  - Called [facility] and they are unable to process the coupon; only 1 per patient allowed in a lifetime or year - Called wife and notified her of situation- provided phone number for [employer] to discuss Ozempic  shipping issues; will plan to call them tomorrow morning   Aloysius Breeding, PharmD [facility] Phone Number: [phone]

## 2023-02-27 NOTE — Patient Instructions (Signed)
 Marland Kitchen  Please review the attached list of medications and notify my office if there are any errors.   . Please bring all of your medications to every appointment so we can make sure that our medication list is the same as yours.

## 2023-02-28 ENCOUNTER — Other Ambulatory Visit: Payer: Self-pay | Admitting: Family Medicine

## 2023-02-28 DIAGNOSIS — E1165 Type 2 diabetes mellitus with hyperglycemia: Secondary | ICD-10-CM

## 2023-03-02 NOTE — Telephone Encounter (Signed)
 Rx 02/27/23 3ml- duplicate request Requested Prescriptions  Pending Prescriptions Disp Refills   OZEMPIC , 1 MG/DOSE, 4 MG/3ML SOPN [Pharmacy Med Name: OZEMPIC  1MG  PER DOSE (4MG /3ML) PFP] 3 mL 0    Sig: INJECT 1 MG UNDER THE SKIN ONCE PER WEEK     Endocrinology:  Diabetes - GLP-1 Receptor Agonists - semaglutide  Failed - 03/02/2023 11:38 AM      Failed - HBA1C in normal range and within 180 days    Hemoglobin A1C  Date Value Ref Range Status  02/27/2023 10.6 (A) 4.0 - 5.6 % Final  03/19/2018 >14.0  Final   Hgb A1c MFr Bld  Date Value Ref Range Status  12/10/2021 11.1 (H) 4.8 - 5.6 % Final    Comment:             Prediabetes: 5.7 - 6.4          Diabetes: >6.4          Glycemic control for adults with diabetes: <7.0          Failed - Cr in normal range and within 360 days    Creatinine, Ser  Date Value Ref Range Status  12/05/2022 1.81 (H) 0.76 - 1.27 mg/dL Final         Passed - Valid encounter within last 6 months    Recent Outpatient Visits           2 months ago Edema, unspecified type   [facility] [name], MD   3 months ago Blister (nonthermal), unspecified lower leg, initial encounter   [facility] [name], MD   5 months ago HFrEF (heart failure with reduced ejection fraction) [facility])   [facility] [name], MD   6 months ago Blister (nonthermal), unspecified lower leg, initial encounter   [facility] [name], MD   7 months ago Upper respiratory tract infection, unspecified type   [facility] [place], [place], PA-C       Future Appointments             In 2 months Fisher, [name], MD [facility]

## 2023-03-03 ENCOUNTER — Other Ambulatory Visit (HOSPITAL_COMMUNITY): Payer: Self-pay

## 2023-03-04 ENCOUNTER — Telehealth: Payer: Self-pay | Admitting: Pharmacist

## 2023-03-04 NOTE — Progress Notes (Signed)
   03/04/2023  Patient ID: Curtis [name], male   DOB: 09-Aug-1955, 67 y.o.   MRN: [id number]  [employer] who reported the patient's Ozempic shipment arrived at the office yesterday at 10:06AM and delivered to [facility]. Tracking number: [id number].  Confirmed with Okey Regal that it arrived and the patient was called to notify of arrival.   Called pharmacy and inactivated the Ozempic 1mg  script due to PAP shipment. Notified PA department that was not needed and can be ignored.     Marlowe Aschoff, PharmD [facility] Phone Number: [phone]

## 2023-03-10 ENCOUNTER — Other Ambulatory Visit (HOSPITAL_COMMUNITY): Payer: Self-pay

## 2023-03-10 ENCOUNTER — Other Ambulatory Visit (HOSPITAL_COMMUNITY): Payer: Self-pay | Admitting: Internal Medicine

## 2023-03-10 ENCOUNTER — Other Ambulatory Visit: Payer: Self-pay

## 2023-03-10 DIAGNOSIS — I5020 Unspecified systolic (congestive) heart failure: Secondary | ICD-10-CM

## 2023-03-10 MED ORDER — FUROSEMIDE 20 MG PO TABS
40.0000 mg | ORAL_TABLET | Freq: Every day | ORAL | 0 refills | Status: DC
  Filled 2023-03-10: qty 60, 30d supply, fill #0

## 2023-03-11 ENCOUNTER — Other Ambulatory Visit: Payer: Self-pay

## 2023-03-24 ENCOUNTER — Telehealth: Payer: Self-pay | Admitting: Family Medicine

## 2023-03-24 NOTE — Telephone Encounter (Signed)
 Copied from CRM [id number]. Topic: General - Other >> Mar 24, 2023  1:56 PM Thliyah D wrote: Reason for CRM: Patient received a summons for jury duty and needs a doctor statement from Dr. Sherrie Mustache stating his disability because he can't do jury duty.

## 2023-03-25 NOTE — Telephone Encounter (Signed)
 This request needs to be formally followed up with PCP for documentation and excuse for [employer]. PCP will be back on 03/30/23.

## 2023-03-26 NOTE — Telephone Encounter (Signed)
 Copied from CRM [id number]. Topic: General - Other >> Mar 25, 2023  1:38 PM Ja-Kwan M wrote: Reason for CRM: Patient spouse reports that the patient needs documentation from Dr. Sherrie Mustache that he can not do jury duty. Patient spouse stated that the documentation is needed before March 18.

## 2023-03-27 NOTE — Telephone Encounter (Signed)
 Patient and wife dropped Curtis [name] duty notice off in the office today. I am sending this back in Dr. Theodis Aguas mailbox.   I told them that Dr. Sherrie Mustache has been out of the office all week and that we would have Dr. Sherrie Mustache type a letter regarding his inability to serve on a jury trial.  This letter is due in before March 18th, 2025.  [name] did say that he had completed a form years ago to be exempt from serving but he received this notice anyway.     Please call his wife patsy, when the letter is ready for pickup.  [phone]

## 2023-03-29 NOTE — Telephone Encounter (Signed)
 Letter printed and signed, please advise pt and leave at front desk for pt to pickup

## 2023-03-30 ENCOUNTER — Telehealth: Payer: Self-pay | Admitting: Internal Medicine

## 2023-03-30 ENCOUNTER — Other Ambulatory Visit: Payer: Self-pay

## 2023-03-30 NOTE — Telephone Encounter (Signed)
 Pt confirmed appt on 03/31/23

## 2023-03-30 NOTE — Telephone Encounter (Signed)
 [name] advised

## 2023-03-31 ENCOUNTER — Telehealth: Payer: Self-pay | Admitting: Family

## 2023-03-31 ENCOUNTER — Ambulatory Visit (HOSPITAL_BASED_OUTPATIENT_CLINIC_OR_DEPARTMENT_OTHER): Payer: Self-pay | Admitting: Internal Medicine

## 2023-03-31 ENCOUNTER — Other Ambulatory Visit
Admission: RE | Admit: 2023-03-31 | Discharge: 2023-03-31 | Disposition: A | Discharge status: Home / Self Care | Payer: Self-pay | Source: Ambulatory Visit | Attending: Internal Medicine | Admitting: Internal Medicine

## 2023-03-31 VITALS — BP 126/62 | HR 78 | Wt 179.0 lb

## 2023-03-31 DIAGNOSIS — I2510 Atherosclerotic heart disease of native coronary artery without angina pectoris: Secondary | ICD-10-CM | POA: Insufficient documentation

## 2023-03-31 DIAGNOSIS — E1169 Type 2 diabetes mellitus with other specified complication: Secondary | ICD-10-CM

## 2023-03-31 DIAGNOSIS — I5020 Unspecified systolic (congestive) heart failure: Secondary | ICD-10-CM | POA: Insufficient documentation

## 2023-03-31 DIAGNOSIS — Z993 Dependence on wheelchair: Secondary | ICD-10-CM

## 2023-03-31 LAB — BASIC METABOLIC PANEL
Anion gap: 11 (ref 5–15)
BUN: 52 mg/dL — ABNORMAL HIGH (ref 8–23)
CO2: 27 mmol/L (ref 22–32)
Calcium: 9.2 mg/dL (ref 8.9–10.3)
Chloride: 97 mmol/L — ABNORMAL LOW (ref 98–111)
Creatinine, Ser: 1.69 mg/dL — ABNORMAL HIGH (ref 0.61–1.24)
GFR, Estimated: 44 mL/min — ABNORMAL LOW (ref >60)
Glucose, Bld: 180 mg/dL — ABNORMAL HIGH (ref 70–99)
Potassium: 4 mmol/L (ref 3.5–5.1)
Sodium: 135 mmol/L (ref 135–145)

## 2023-03-31 LAB — BRAIN NATRIURETIC PEPTIDE: B Natriuretic Peptide: 80.1 pg/mL (ref 0.0–100.0)

## 2023-03-31 LAB — MAGNESIUM: Magnesium: 2.2 mg/dL (ref 1.7–2.4)
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
## 2023-03-31 NOTE — Patient Instructions (Signed)
   Lab Work:   Go over to the MEDICAL MALL. Go pass the gift shop and have your blood work completed.  We will only call you if the results are abnormal or if the provider would like to make medication changes.\   Testing/Procedures:  Your physician has requested that you have an echocardiogram. Echocardiography is a painless test that uses sound waves to create images of your heart. It provides your doctor with information about the size and shape of your heart and how well your heart's chambers and valves are working. This procedure takes approximately one hour. There are no restrictions for this procedure. Please do NOT wear cologne, perfume, aftershave, or lotions (deodorant is allowed). Please arrive 15 minutes prior to your appointment time.  Please note: We ask at that you not bring children with you during ultrasound (echo/ vascular) testing. Due to room size and safety concerns, children are not allowed in the ultrasound rooms during exams. Our front office staff cannot provide observation of children in our lobby area while testing is being conducted. An adult accompanying a patient to their appointment will only be allowed in the ultrasound room at the discretion of the ultrasound technician under special circumstances. We apologize for any inconvenience.  Please have your echo completed. You will check in for this at the [facility]. You have to arrive 15 MINS EARLY for preparation, otherwise you will have to reschedule.   Follow-Up in: Please follow up with the [facility] in 1 month. We currently do not have that schedule. Please call back in the beginning of April in order to schedule that appointment.   At the [facility], you and your health needs are our priority. We have a designated team specialized in the treatment of Heart Failure. This Care Team includes your primary Heart Failure Specialized Cardiologist (physician), Advanced Practice  Providers (APPs- Physician Assistants and Nurse Practitioners), and Pharmacist who all work together to provide you with the care you need, when you need it.   You may see any of the following providers on your designated Care Team at your next follow up:  Dr. Arvilla Meres Dr. Marca Ancona Dr. Dorthula Nettles Dr. Theresia Bough Clarisa Kindred, NP Tonye Becket, NP Robbie Lis, [place] [facility] [place], [place] Brynda Peon, NP [place] Lee, NP Karle Plumber, PharmD   Please be sure to bring in all your medications bottles to every appointment.   Need to Contact us:  If you have any questions or concerns before your next appointment please send [place] a message through [place] or call our office at [phone].    TO LEAVE A MESSAGE FOR THE NURSE SELECT OPTION 2, PLEASE LEAVE A MESSAGE INCLUDING: YOUR NAME DATE OF BIRTH CALL BACK NUMBER REASON FOR CALL**this is important as we prioritize the call backs  YOU WILL RECEIVE A CALL BACK THE SAME DAY AS LONG AS YOU CALL BEFORE 4:00 PM

## 2023-04-03 LAB — HM DIABETES EYE EXAM

## 2023-04-13 ENCOUNTER — Other Ambulatory Visit (HOSPITAL_COMMUNITY): Payer: Self-pay | Admitting: Internal Medicine

## 2023-04-13 ENCOUNTER — Other Ambulatory Visit (HOSPITAL_COMMUNITY): Payer: Self-pay

## 2023-04-13 DIAGNOSIS — I5020 Unspecified systolic (congestive) heart failure: Secondary | ICD-10-CM

## 2023-04-14 ENCOUNTER — Other Ambulatory Visit: Payer: Self-pay | Admitting: Family Medicine

## 2023-04-14 ENCOUNTER — Other Ambulatory Visit (HOSPITAL_COMMUNITY): Payer: Self-pay

## 2023-04-14 DIAGNOSIS — G8929 Other chronic pain: Secondary | ICD-10-CM

## 2023-04-14 NOTE — Telephone Encounter (Signed)
 Copied from CRM [id number]. Topic: Clinical - Medication Refill >> Apr 14, 2023  1:41 PM Ernst Spell wrote: Most Recent Primary Care Visit:  Provider: Malva Limes  Department: [facility]  Visit Type: OFFICE VISIT  Date: 02/27/2023  Medication: hydrocodone  Has the patient contacted their pharmacy? No Stated that they were told they have to call the office   Is this the correct pharmacy for this prescription? Yes If no, delete pharmacy and type the correct one.  This is the patient's preferred pharmacy:   [id number] - [place], [place] - [address] AT [facility] OF [address] [place] [address] [place] [address] Phone: [phone] Fax: [phone]  [place] [facility] [address] [place] [address] Phone: [phone] Fax: [phone]   Has the prescription been filled recently? Yes  Is the patient out of the medication? Yes  Has the patient been seen for an appointment in the last year OR does the patient have an upcoming appointment? Yes  Can we respond through MyChart? No  Agent: Please be advised that Rx refills may take up to 3 business days. We ask that you follow-up with your pharmacy.

## 2023-04-15 ENCOUNTER — Other Ambulatory Visit (HOSPITAL_COMMUNITY): Payer: Self-pay

## 2023-04-15 ENCOUNTER — Other Ambulatory Visit (HOSPITAL_COMMUNITY): Payer: Self-pay | Admitting: Cardiology

## 2023-04-15 MED ORDER — FUROSEMIDE 20 MG PO TABS
40.0000 mg | ORAL_TABLET | Freq: Every day | ORAL | 6 refills | Status: DC
  Filled 2023-04-15: qty 60, 30d supply, fill #0
  Filled 2023-05-18: qty 60, 30d supply, fill #1
  Filled 2023-06-17: qty 60, 30d supply, fill #2
  Filled 2023-07-22: qty 60, 30d supply, fill #3
  Filled 2023-08-25 (×2): qty 60, 30d supply, fill #0
  Filled 2023-09-29: qty 60, 30d supply, fill #1
  Filled 2023-10-27: qty 60, 30d supply, fill #2

## 2023-04-15 NOTE — Telephone Encounter (Signed)
 Requested medication (s) are due for refill today: yes  Requested medication (s) are on the active medication list: yes  Last refill:  02/18/23 #30/0  Future visit scheduled: no  Notes to clinic:  Unable to refill per protocol, cannot delegate.    Requested Prescriptions  Pending Prescriptions Disp Refills   HYDROcodone-acetaminophen (NORCO/VICODIN) 5-325 MG tablet 30 tablet 0    Sig: Take 1 tablet by mouth every 6 (six) hours as needed for moderate pain (pain score 4-6).     There is no refill protocol information for this order

## 2023-04-16 ENCOUNTER — Other Ambulatory Visit: Payer: Self-pay | Admitting: Family Medicine

## 2023-04-16 ENCOUNTER — Telehealth: Payer: Self-pay | Admitting: Internal Medicine

## 2023-04-16 DIAGNOSIS — G8929 Other chronic pain: Secondary | ICD-10-CM

## 2023-04-16 MED ORDER — HYDROCODONE-ACETAMINOPHEN 5-325 MG PO TABS: 1.0000 | ORAL_TABLET | Freq: Four times a day (QID) | ORAL | 0 refills | Status: DC | PRN

## 2023-04-16 NOTE — Telephone Encounter (Signed)
 Copied from CRM [id number]. Topic: Clinical - Medication Refill >> Apr 16, 2023  1:23 PM [name] wrote: Most Recent Primary Care Visit:  Provider: Malva Limes  Department: [facility]  Visit Type: OFFICE VISIT  Date: 02/27/2023  Medication: HYDROcodone-acetaminophen (NORCO/VICODIN) 5-325 MG tablet [[id number]]  Patients wife called 04/14/23 and placed a Mychart refill request today. Wife Advised Medication refills can take up to three business days.  Has the patient contacted their pharmacy? Yes (Agent: If no, request that the patient contact the pharmacy for the refill. If patient does not wish to contact the pharmacy document the reason why and proceed with request.) (Agent: If yes, when and what did the pharmacy advise?)  Is this the correct pharmacy for this prescription? Yes If no, delete pharmacy and type the correct one.  This is the patient's preferred pharmacy:     [id number] - [place], [place] - [address] AT [facility] OF [address] [place] [address] [place] [address] Phone: [phone] Fax: [phone]    Has the prescription been filled recently? Yes  Is the patient out of the medication? Yes  Has the patient been seen for an appointment in the last year OR does the patient have an upcoming appointment? Yes  Can we respond through MyChart? Yes  Agent: Please be advised that Rx refills may take up to 3 business days. We ask that you follow-up with your pharmacy.

## 2023-04-16 NOTE — Telephone Encounter (Signed)
 Lvm to sched recall

## 2023-04-21 ENCOUNTER — Telehealth: Payer: Self-pay | Admitting: Family Medicine

## 2023-04-21 NOTE — Telephone Encounter (Signed)
 Please check with patient to make sure he got the Ozempic through the patient assistance program. If not we might be able to get some samples.

## 2023-04-29 ENCOUNTER — Ambulatory Visit
Admission: RE | Admit: 2023-04-29 | Discharge: 2023-04-29 | Disposition: A | Discharge status: Home / Self Care | Payer: Self-pay | Source: Ambulatory Visit | Attending: Internal Medicine | Admitting: Internal Medicine

## 2023-04-29 ENCOUNTER — Other Ambulatory Visit: Payer: Self-pay

## 2023-04-29 DIAGNOSIS — I2510 Atherosclerotic heart disease of native coronary artery without angina pectoris: Secondary | ICD-10-CM | POA: Insufficient documentation

## 2023-04-29 DIAGNOSIS — E119 Type 2 diabetes mellitus without complications: Secondary | ICD-10-CM | POA: Insufficient documentation

## 2023-04-29 DIAGNOSIS — I5020 Unspecified systolic (congestive) heart failure: Secondary | ICD-10-CM | POA: Insufficient documentation

## 2023-04-29 DIAGNOSIS — I358 Other nonrheumatic aortic valve disorders: Secondary | ICD-10-CM | POA: Insufficient documentation

## 2023-04-29 DIAGNOSIS — I252 Old myocardial infarction: Secondary | ICD-10-CM | POA: Insufficient documentation

## 2023-04-29 DIAGNOSIS — I110 Hypertensive heart disease with heart failure: Secondary | ICD-10-CM | POA: Insufficient documentation

## 2023-04-29 LAB — ECHOCARDIOGRAM COMPLETE
AR max vel: 2.78 cm2
AV Area VTI: 2.74 cm2
AV Area mean vel: 2.67 cm2
AV Mean grad: 1 mmHg
AV Peak grad: 1.8 mmHg
Ao pk vel: 0.68 m/s
Area-P 1/2: 4.63 cm2
MV VTI: 2.09 cm2
S' Lateral: 3 cm

## 2023-05-12 ENCOUNTER — Other Ambulatory Visit (HOSPITAL_COMMUNITY): Payer: Self-pay

## 2023-05-18 ENCOUNTER — Other Ambulatory Visit (HOSPITAL_COMMUNITY): Payer: Self-pay

## 2023-05-22 ENCOUNTER — Encounter: Payer: Self-pay | Admitting: Family Medicine

## 2023-05-22 ENCOUNTER — Ambulatory Visit (INDEPENDENT_AMBULATORY_CARE_PROVIDER_SITE_OTHER): Payer: Self-pay | Admitting: Family Medicine

## 2023-05-22 VITALS — BP 135/73 | HR 83 | Resp 16 | Wt 179.0 lb

## 2023-05-22 DIAGNOSIS — R600 Localized edema: Secondary | ICD-10-CM

## 2023-05-22 DIAGNOSIS — R5383 Other fatigue: Secondary | ICD-10-CM

## 2023-05-22 DIAGNOSIS — R809 Proteinuria, unspecified: Secondary | ICD-10-CM

## 2023-05-22 DIAGNOSIS — E785 Hyperlipidemia, unspecified: Secondary | ICD-10-CM

## 2023-05-22 DIAGNOSIS — I5032 Chronic diastolic (congestive) heart failure: Secondary | ICD-10-CM

## 2023-05-22 DIAGNOSIS — G8929 Other chronic pain: Secondary | ICD-10-CM

## 2023-05-22 DIAGNOSIS — I2510 Atherosclerotic heart disease of native coronary artery without angina pectoris: Secondary | ICD-10-CM

## 2023-05-22 DIAGNOSIS — M79604 Pain in right leg: Secondary | ICD-10-CM

## 2023-05-22 DIAGNOSIS — E1165 Type 2 diabetes mellitus with hyperglycemia: Secondary | ICD-10-CM

## 2023-05-22 DIAGNOSIS — Z7984 Long term (current) use of oral hypoglycemic drugs: Secondary | ICD-10-CM

## 2023-05-22 DIAGNOSIS — M25512 Pain in left shoulder: Secondary | ICD-10-CM

## 2023-05-22 LAB — POCT GLYCOSYLATED HEMOGLOBIN (HGB A1C)
Est. average glucose Bld gHb Est-mCnc: 232
Hemoglobin A1C: 9.7 % — AB (ref 4.0–5.6)

## 2023-05-22 NOTE — Progress Notes (Addendum)
 Established patient visit   Patient: Curtis [name]   DOB: 07/09/1955   68 y.o. Male  MRN: [id number] Visit Date: 05/22/2023  Today's healthcare provider: Jeralene Mom, MD   Chief Complaint  Patient presents with   Medical Management of Chronic Issues    Diabetes   Subjective    HPI Follow up diabetes since getting on patient assistance for 1mg  Ozempic  the last couple of months. He states he has some trouble with upset stomach and frequent BM for a day or two after injection, but otherwise tolerating well. Home sugars are much better, mostly in the low to mid 100s. Also states he been feeling unusually fatigue for about two weeks. No recent changes in medications. No dyspnea. Had echo per Dr. Bensimhon on 4-9 which should EF has normalized with no significant valve disease. No fevers, chills, or sweats, but legs have been a bit more swollen the last few weeks.   Lab Results  Component Value Date   HGBA1C 9.7 (A) 05/22/2023   HGBA1C 10.6 (A) 02/27/2023   HGBA1C 9.1 (A) 06/06/2022   Lab Results  Component Value Date   NA 135 03/31/2023   K 4.0 03/31/2023   CREATININE 1.69 (H) 03/31/2023   GFRNONAA 44 (L) 03/31/2023   GLUCOSE 180 (H) 03/31/2023   Lab Results  Component Value Date   CHOL 175 06/06/2022   HDL 21 (L) 06/06/2022   LDLCALC Comment (A) 06/06/2022   LDLDIRECT 16.7 12/05/2020   TRIG 881 (HH) 06/06/2022   CHOLHDL 8.3 (H) 06/06/2022   Lab Results  Component Value Date   TSH 1.970 10/07/2021    Lab Results  Component Value Date   LABMICR 2,667.6 06/06/2022   MICROALBUR 50 10/25/2019   MICROALBUR 100 08/27/2018     Medications: Outpatient Medications Prior to Visit  Medication Sig   acetaminophen  (TYLENOL ) 500 MG tablet Take 2 tablets (1,000 mg total) by mouth every 6 (six) hours.   aspirin  81 MG chewable tablet Chew 1 tablet (81 mg total) by mouth daily.   atorvastatin  (LIPITOR) 80 MG tablet Take 1 tablet (80 mg total) by mouth daily.    empagliflozin  (JARDIANCE ) 10 MG TABS tablet Take 1 tablet (10 mg total) by mouth daily before breakfast.   famotidine  (PEPCID ) 40 MG tablet Take 1 tablet (40 mg total) by mouth daily.   furosemide  (LASIX ) 20 MG tablet Take 2 tablets (40 mg total) by mouth daily. NEEDS TO KEEP FOLLOW UP APPOINTMENT FOR MORE REFILLS   glipiZIDE  (GLUCOTROL ) 10 MG tablet Take 1 tablet (10 mg total) by mouth 2 (two) times daily before a meal.   HYDROcodone -acetaminophen  (NORCO/VICODIN) 5-325 MG tablet Take 1 tablet by mouth every 6 (six) hours as needed for moderate pain (pain score 4-6).   Lancet Devices MISC Use to check blood sugar twice a day   metFORMIN  (GLUCOPHAGE -XR) 500 MG 24 hr tablet Take 1 tablet (500 mg total) by mouth daily.   metolazone  (ZAROXOLYN ) 2.5 MG tablet Take 1 tablet (2.5 mg total) by mouth daily.   metoprolol  succinate (TOPROL -XL) 50 MG 24 hr tablet Take 1 tablet (50 mg total) by mouth daily.   sacubitril -valsartan  (ENTRESTO ) 97-103 MG TAKE 1 TABLET BY MOUTH 2 (TWO) TIMES DAILY.   Semaglutide , 1 MG/DOSE, 4 MG/3ML SOPN Inject 1 mg as directed once a week.   silver  sulfADIAZINE  (SILVADENE ) 1 % cream Apply 1 Application topically daily.   No facility-administered medications prior to visit.    Review of Systems  Constitutional:  Positive for fatigue. Negative for appetite change, chills, diaphoresis and fever.  Respiratory:  Negative for chest tightness, shortness of breath and wheezing.   Cardiovascular:  Negative for chest pain and palpitations.  Gastrointestinal:  Negative for abdominal pain, nausea and vomiting.       Objective    BP 135/73 (BP Location: Right Arm, Patient Position: Sitting, Cuff Size: Normal)   Pulse 83   Resp 16   Wt 179 lb (81.2 kg)   SpO2 98%   BMI 27.22 kg/m    Physical Exam   General: Appearance:    Well developed, well nourished male in no acute distress  Eyes:    PERRL, conjunctiva/corneas clear, EOM's intact       Lungs:     Clear to auscultation  bilaterally, respirations unlabored  Heart:    Normal heart rate. Normal rhythm. No murmurs, rubs, or gallops.    MS:   All extremities are intact.    Neurologic:   Awake, alert, oriented x 3. No apparent focal neurological defect.        Results for orders placed or performed in visit on 05/22/23  POCT glycosylated hemoglobin (Hb A1C)  Result Value Ref Range   Hemoglobin A1C 9.7 (A) 4.0 - 5.6 %   Est. average glucose Bld gHb Est-mCnc 232     Assessment & Plan     1. Uncontrolled type 2 diabetes mellitus with hyperglycemia (HCC) (Primary) Significant improvement since getting back on 1mg  Ozempic  consistently. Having some mild GI effects and pt prefers not to titrate up any more for now. Consider increase empagliflozin  which may also provide some renal benefits. See how labs look first.   2. Heart failure with improved ejection fraction (HFimpEF) (HCC) Recent echo looked good is to follow up with with Dr. Benzimhon as scheduled.   3. 3-vessel coronary artery disease Asymptomatic. Compliant with medication.  Continue aggressive risk factor modification.    4. Dyslipidemia He is tolerating atorvastatin  well with no adverse effects.   - Lipid panel  5. Microalbuminuria Consider increase empagliflozin  to 25mg  after reviewing labs.   6. Other fatigue Recent onset with no associated sx.  - CBC - Comprehensive metabolic panel with GFR - TSH - Vitamin B12   7. LE Edema Keep legs elevated as much as possible improve if empagliflozin  is increased. Consider increasing furosemide .         Jeralene Mom, MD  [facility] [phone] (phone) [phone] (fax)  [facility]

## 2023-05-22 NOTE — Patient Instructions (Signed)
 Curtis [name]  Please review the attached list of medications and notify my office if there are any errors.   . Please bring all of your medications to every appointment so we can make sure that our medication list is the same as yours.

## 2023-05-23 LAB — COMPREHENSIVE METABOLIC PANEL WITH GFR
ALT: 15 IU/L (ref 0–44)
AST: 15 IU/L (ref 0–40)
Albumin: 4.1 g/dL (ref 3.9–4.9)
Alkaline Phosphatase: 73 IU/L (ref 44–121)
BUN/Creatinine Ratio: 25 — ABNORMAL HIGH (ref 10–24)
BUN: 40 mg/dL — ABNORMAL HIGH (ref 8–27)
Bilirubin Total: 0.2 mg/dL (ref 0.0–1.2)
CO2: 20 mmol/L (ref 20–29)
Calcium: 8.9 mg/dL (ref 8.6–10.2)
Chloride: 95 mmol/L — ABNORMAL LOW (ref 96–106)
Creatinine, Ser: 1.6 mg/dL — ABNORMAL HIGH (ref 0.76–1.27)
Globulin, Total: 3.1 g/dL (ref 1.5–4.5)
Glucose: 203 mg/dL — ABNORMAL HIGH (ref 70–99)
Potassium: 4.7 mmol/L (ref 3.5–5.2)
Sodium: 136 mmol/L (ref 134–144)
Total Protein: 7.2 g/dL (ref 6.0–8.5)
eGFR: 47 mL/min/{1.73_m2} — ABNORMAL LOW (ref >59)

## 2023-05-23 LAB — CBC
Hematocrit: 36.5 % — ABNORMAL LOW (ref 37.5–51.0)
Hemoglobin: 12 g/dL — ABNORMAL LOW (ref 13.0–17.7)
MCH: 29.6 pg (ref 26.6–33.0)
MCHC: 32.9 g/dL (ref 31.5–35.7)
MCV: 90 fL (ref 79–97)
Platelets: 199 10*3/uL (ref 150–450)
RBC: 4.06 x10E6/uL — ABNORMAL LOW (ref 4.14–5.80)
RDW: 14.2 % (ref 11.6–15.4)
WBC: 6.6 10*3/uL (ref 3.4–10.8)

## 2023-05-23 LAB — LIPID PANEL
Chol/HDL Ratio: 8.8 ratio — ABNORMAL HIGH (ref 0.0–5.0)
Cholesterol, Total: 176 mg/dL (ref 100–199)
HDL: 20 mg/dL — ABNORMAL LOW (ref >39)
Triglycerides: 972 mg/dL (ref 0–149)

## 2023-05-23 LAB — VITAMIN B12: Vitamin B-12: 236 pg/mL (ref 232–1245)

## 2023-05-23 LAB — TSH: TSH: 1.7 u[IU]/mL (ref 0.450–4.500)

## 2023-05-25 ENCOUNTER — Telehealth (HOSPITAL_COMMUNITY): Payer: Self-pay

## 2023-05-25 NOTE — Telephone Encounter (Signed)
 Advanced Heart Failure Patient Advocate Encounter  Application for Entresto  faxed to [employer] on 05/25/2023. Application form attached to patient chart.  Patient informed that [employer] requirements for 2025 have been updated and he may not qualify. Pt would still like to apply. If denied, patient is aware that we may need to request a grant for medication assistance for 2025.  Kennis Peacock, CPhT Rx Patient Advocate Phone: [phone]

## 2023-05-26 ENCOUNTER — Encounter: Payer: Self-pay | Admitting: Family Medicine

## 2023-05-27 ENCOUNTER — Telehealth: Payer: Self-pay | Admitting: Family Medicine

## 2023-05-27 ENCOUNTER — Telehealth: Payer: Self-pay | Admitting: Internal Medicine

## 2023-05-27 ENCOUNTER — Other Ambulatory Visit: Payer: Self-pay | Admitting: Family Medicine

## 2023-05-27 DIAGNOSIS — G8929 Other chronic pain: Secondary | ICD-10-CM

## 2023-05-27 NOTE — Telephone Encounter (Signed)
 Called patient to remind him of his scheduled appointment on 05/29/2023 for left arm injury.  Pt was asleep and spoke to patients wife.  The wife acknowledged upcoming appt on 05/29/2023.  Offered earlier appointment and wife stated they would wait until 05/29/23 to see Dr. Shann Darnel.  Explained to with if the patients are was worsening, became painful or started bleeding again to seek medical attention.  Wife agreed and stated patient would be okay to wait until Friday as he had multiple doctor appointments that week and was seeing the Cardiologist 05/28/23 as well.

## 2023-05-27 NOTE — Telephone Encounter (Signed)
 Copied from CRM [id number]. Topic: Clinical - Medication Question >> May 27, 2023  3:07 PM Emylou G wrote: Reason for CRM: Patients Wife called.. needs HYDROcodone -acetaminophen  (NORCO/VICODIN) 5-325 MG tablet for patient.Arnetta Lank this is through workers comp and Dr Shann Darnel has to send it in personally?[facility] submit for refill. [facility] is preferred pharm.. She states they call in monthly?

## 2023-05-27 NOTE — Telephone Encounter (Signed)
 Called to confirm/remind patient of their appointment at the [facility] on 05/28/23.   Appointment:   [] Confirmed  [] Left mess   [x] No answer/No voice mail  [] VM Full/unable to leave message  [] Phone not in service  Patient reminded to bring all medications and/or complete list.  Confirmed patient has transportation. Gave directions, instructed to utilize valet parking.

## 2023-05-28 ENCOUNTER — Ambulatory Visit: Payer: Self-pay | Attending: Internal Medicine | Admitting: Internal Medicine

## 2023-05-28 ENCOUNTER — Other Ambulatory Visit (HOSPITAL_COMMUNITY): Payer: Self-pay

## 2023-05-28 VITALS — BP 116/52 | HR 88 | Wt 178.1 lb

## 2023-05-28 DIAGNOSIS — Z87891 Personal history of nicotine dependence: Secondary | ICD-10-CM | POA: Insufficient documentation

## 2023-05-28 DIAGNOSIS — I5022 Chronic systolic (congestive) heart failure: Secondary | ICD-10-CM | POA: Insufficient documentation

## 2023-05-28 DIAGNOSIS — Z951 Presence of aortocoronary bypass graft: Secondary | ICD-10-CM | POA: Insufficient documentation

## 2023-05-28 DIAGNOSIS — Z79899 Other long term (current) drug therapy: Secondary | ICD-10-CM | POA: Insufficient documentation

## 2023-05-28 DIAGNOSIS — E1165 Type 2 diabetes mellitus with hyperglycemia: Secondary | ICD-10-CM | POA: Insufficient documentation

## 2023-05-28 DIAGNOSIS — Z7982 Long term (current) use of aspirin: Secondary | ICD-10-CM | POA: Insufficient documentation

## 2023-05-28 DIAGNOSIS — Z993 Dependence on wheelchair: Secondary | ICD-10-CM

## 2023-05-28 DIAGNOSIS — I2510 Atherosclerotic heart disease of native coronary artery without angina pectoris: Secondary | ICD-10-CM

## 2023-05-28 DIAGNOSIS — Z7984 Long term (current) use of oral hypoglycemic drugs: Secondary | ICD-10-CM | POA: Insufficient documentation

## 2023-05-28 DIAGNOSIS — E1169 Type 2 diabetes mellitus with other specified complication: Secondary | ICD-10-CM

## 2023-05-28 DIAGNOSIS — I110 Hypertensive heart disease with heart failure: Secondary | ICD-10-CM | POA: Insufficient documentation

## 2023-05-28 DIAGNOSIS — Z7985 Long-term (current) use of injectable non-insulin antidiabetic drugs: Secondary | ICD-10-CM | POA: Insufficient documentation

## 2023-05-28 DIAGNOSIS — E785 Hyperlipidemia, unspecified: Secondary | ICD-10-CM | POA: Insufficient documentation

## 2023-05-28 DIAGNOSIS — R531 Weakness: Secondary | ICD-10-CM | POA: Insufficient documentation

## 2023-05-28 DIAGNOSIS — I5020 Unspecified systolic (congestive) heart failure: Secondary | ICD-10-CM

## 2023-05-28 DIAGNOSIS — I872 Venous insufficiency (chronic) (peripheral): Secondary | ICD-10-CM

## 2023-05-28 NOTE — Telephone Encounter (Signed)
 [employer] is requesting proof of income before making a determination. Income forms were submitted with application, however [employer] is only accepting 1040 or attestation letter for 2025 approvals. I have left the patient a voicemail to [employer] for attestation letter if needed, or to contact me if pt would like to pursue [employer] grant for 2025.   Of note, Jardiance  has been approved until 01/08/2024 through [facility], so patient would only need to have Entresto  filled with preferred local pharmacy or have it shipped out by [facility].   Will continue to follow up.

## 2023-05-28 NOTE — Patient Instructions (Signed)
 Great to see you today!!  No changes, continue current medications  Please wear your compression hose daily, place them on as soon as you get up in the morning and remove before you go to bed at night.  Your physician recommends that you schedule a follow-up appointment in: 3 months (August), we will call you closer to this time to schedule   If you have any questions or concerns before your next appointment please send us  a message through [place] or call our office at [phone], If it is after office hours your call will be answered by our answering service and directed appropriately.     At the [facility], you and your health needs are our priority. We have a designated team specialized in the treatment of Heart Failure. This Care Team includes your primary Heart Failure Specialized Cardiologist (physician), Advanced Practice Providers (APPs- Physician Assistants and Nurse Practitioners), and Pharmacist who all work together to provide you with the care you need, when you need it.   You may see any of the following providers on your designated Care Team at your next follow up:  Dr. Jules Oar Dr. Peder Bourdon Dr. Alwin Baars Dr. Judyth Nunnery Nieves Bars, NP Ruddy Corral, [place] [address] [place], [place] Dennise Fitz, NP [place] Lee, NP [place] Dellen, NP Bevely Brush, PharmD
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
## 2023-05-28 NOTE — Telephone Encounter (Signed)
 Review of patient chart shows that this patient has Workers Comp coverage only. I have called to confirm with [employer] that the pt is uninsured. Pt will need to contact [employer] to request attestation letter. I will reach back out to patient to inform them.

## 2023-05-29 ENCOUNTER — Encounter: Payer: Self-pay | Admitting: Family Medicine

## 2023-05-29 ENCOUNTER — Other Ambulatory Visit: Payer: Self-pay

## 2023-05-29 ENCOUNTER — Ambulatory Visit (INDEPENDENT_AMBULATORY_CARE_PROVIDER_SITE_OTHER): Payer: Self-pay | Admitting: Family Medicine

## 2023-05-29 VITALS — BP 118/67 | HR 80 | Temp 98.4°F | Ht 68.0 in | Wt 178.1 lb

## 2023-05-29 DIAGNOSIS — M25512 Pain in left shoulder: Secondary | ICD-10-CM

## 2023-05-29 DIAGNOSIS — S51812A Laceration without foreign body of left forearm, initial encounter: Secondary | ICD-10-CM

## 2023-05-29 MED ORDER — HYDROCODONE-ACETAMINOPHEN 5-325 MG PO TABS
1.0000 | ORAL_TABLET | Freq: Four times a day (QID) | ORAL | 0 refills | Status: DC | PRN
Start: 2023-05-29 — End: 2023-08-20

## 2023-05-29 NOTE — Patient Instructions (Signed)
 Marland Kitchen  Please review the attached list of medications and notify my office if there are any errors.   . Please bring all of your medications to every appointment so we can make sure that our medication list is the same as yours.

## 2023-05-29 NOTE — Addendum Note (Signed)
 Addended by: Jeralene Mom E on: 05/29/2023 12:00 PM   Modules accepted: Orders, Level of Service

## 2023-06-02 ENCOUNTER — Ambulatory Visit: Payer: Self-pay | Admitting: Family Medicine

## 2023-06-03 ENCOUNTER — Telehealth: Payer: Self-pay | Admitting: Family Medicine

## 2023-06-03 NOTE — Telephone Encounter (Signed)
 Spoke to pt spouse by phone, she will reach out to [employer] to request Attestation letter for 2025.

## 2023-06-03 NOTE — Telephone Encounter (Signed)
 Spoke with Curtis [name] to let her know that Ozempic  had come in from patient assistant program for Curtis [name].

## 2023-06-11 ENCOUNTER — Other Ambulatory Visit: Payer: Self-pay
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
## 2023-06-17 ENCOUNTER — Other Ambulatory Visit: Payer: Self-pay

## 2023-06-17 ENCOUNTER — Other Ambulatory Visit (HOSPITAL_COMMUNITY): Payer: Self-pay

## 2023-06-17 ENCOUNTER — Other Ambulatory Visit: Payer: Self-pay | Admitting: Family Medicine

## 2023-06-17 DIAGNOSIS — S80829A Blister (nonthermal), unspecified lower leg, initial encounter: Secondary | ICD-10-CM

## 2023-06-17 MED ORDER — METOLAZONE 2.5 MG PO TABS
2.5000 mg | ORAL_TABLET | Freq: Every day | ORAL | 1 refills | Status: DC
  Filled 2023-06-17: qty 90, 90d supply, fill #0
  Filled 2023-09-15: qty 90, 90d supply, fill #1

## 2023-06-17 NOTE — Telephone Encounter (Signed)
 Patient was approved to receive Entresto  from [employer] Effective 06/16/2023 to 06/15/2024 Informed pt spouse by phone

## 2023-06-18 ENCOUNTER — Other Ambulatory Visit (HOSPITAL_COMMUNITY): Payer: Self-pay

## 2023-06-30 ENCOUNTER — Telehealth: Payer: Self-pay

## 2023-06-30 NOTE — Telephone Encounter (Signed)
 Gave pt a call pt is coming up due on Ozempic  Novo Nordisk on 08/03/23,will follow up in a few days if pt does not call back.

## 2023-07-03 ENCOUNTER — Other Ambulatory Visit: Payer: Self-pay

## 2023-07-08 NOTE — Telephone Encounter (Signed)
 Gave pt a call pt is coming up due on PAP Novo Nordisk Ozempic  on 08/03/23 pt did not answer.

## 2023-07-15 NOTE — Telephone Encounter (Signed)
 Gave pt a call regarding PAP Novo Nordisk (Ozempic ) coming up due for re-enrollment on 08/03/23,pt did not answer left a HIPAA VM

## 2023-07-22 ENCOUNTER — Other Ambulatory Visit (HOSPITAL_COMMUNITY): Payer: Self-pay | Admitting: Internal Medicine

## 2023-07-22 ENCOUNTER — Other Ambulatory Visit (HOSPITAL_COMMUNITY): Payer: Self-pay

## 2023-07-22 ENCOUNTER — Telehealth (HOSPITAL_COMMUNITY): Payer: Self-pay

## 2023-07-22 DIAGNOSIS — I5020 Unspecified systolic (congestive) heart failure: Secondary | ICD-10-CM

## 2023-07-22 MED ORDER — METOPROLOL SUCCINATE ER 50 MG PO TB24
50.0000 mg | ORAL_TABLET | Freq: Every day | ORAL | 3 refills | Status: AC
  Filled 2023-07-22 – 2023-08-25 (×5): qty 30, 30d supply, fill #0
  Filled 2023-09-29: qty 30, 30d supply, fill #1
  Filled 2023-10-27 (×2): qty 30, 30d supply, fill #2
  Filled 2023-11-18 – 2023-11-30 (×2): qty 30, 30d supply, fill #3
  Filled 2023-12-29 (×2): qty 30, 30d supply, fill #4
  Filled 2024-01-29: qty 30, 30d supply, fill #5
  Filled 2024-02-24: qty 30, 30d supply, fill #6

## 2023-07-22 NOTE — Telephone Encounter (Signed)
 Advanced Heart Failure Patient Advocate Encounter  Patient spouse contacted office regarding refill request for Metoprolol . I have forwarded the message to office staff to see if this request can be expedited.  Rachel DEL, CPhT Rx Patient Advocate Phone: [phone]

## 2023-08-04 ENCOUNTER — Telehealth: Payer: Self-pay | Admitting: Family Medicine

## 2023-08-04 ENCOUNTER — Other Ambulatory Visit: Payer: Self-pay

## 2023-08-04 NOTE — Telephone Encounter (Signed)
 Patient is due for follow up appointment to check on A1c and lipids in August and needs to schedule before schedule is filled. Also, please check and verify that he was able to get 1mg  Ozempic  through patient assistance and is taking consistently.

## 2023-08-04 NOTE — Telephone Encounter (Signed)
 Gave pt a call pt is due on Ozempic  Novo Nordisk on 08/03/23 pt did not answer we have tried calling 4x pt has not return call,pt can call at his earliest convenience and we can star the pap process.

## 2023-08-04 NOTE — Telephone Encounter (Signed)
 Called and spoke with pt wife appt has been scheduled 09/07/23 at 11 am.

## 2023-08-20 ENCOUNTER — Other Ambulatory Visit (HOSPITAL_COMMUNITY): Payer: Self-pay | Admitting: Internal Medicine

## 2023-08-20 ENCOUNTER — Other Ambulatory Visit: Payer: Self-pay | Admitting: Family Medicine

## 2023-08-20 DIAGNOSIS — G8929 Other chronic pain: Secondary | ICD-10-CM

## 2023-08-20 NOTE — Telephone Encounter (Signed)
 Copied from CRM #[id number]. Topic: Clinical - Medication Refill >> Aug 20, 2023 11:03 AM Berwyn MATSU wrote: Medication: HYDROcodone -acetaminophen  (NORCO/VICODIN) 5-325 MG tablet  Has the patient contacted their pharmacy? Yes (Agent: If no, request that the patient contact the pharmacy for the refill. If patient does not wish to contact the pharmacy document the reason why and proceed with request.) (Agent: If yes, when and what did the pharmacy advise?)  This is the patient's preferred pharmacy:   [id number] [place] [place], [place] - [address] AT [facility] OF [address] [place] [address] Phone: [phone] Fax: [phone]   Is this the correct pharmacy for this prescription? Yes If no, delete pharmacy and type the correct one.   Has the prescription been filled recently? Yes  Is the patient out of the medication? Yes  Has the patient been seen for an appointment in the last year OR does the patient have an upcoming appointment? Yes  Can we respond through MyChart? Yes  Agent: Please be advised that Rx refills may take up to 3 business days. We ask that you follow-up with your pharmacy.

## 2023-08-21 NOTE — Telephone Encounter (Signed)
 Requested medications are due for refill today.  yes  Requested medications are on the active medications list.  yes  Last refill. 05/29/2023 #30 0 rf  Future visit scheduled.   yes  Notes to clinic.  Refill not delegated.    Requested Prescriptions  Pending Prescriptions Disp Refills   HYDROcodone -acetaminophen  (NORCO/VICODIN) 5-325 MG tablet 30 tablet 0    Sig: Take 1 tablet by mouth every 6 (six) hours as needed for moderate pain (pain score 4-6).     Not Delegated - Analgesics:  Opioid Agonist Combinations Failed - 08/21/2023 10:35 AM      Failed - This refill cannot be delegated      Failed - Urine Drug Screen completed in last 360 days      Passed - Valid encounter within last 3 months    Recent Outpatient Visits           2 months ago Skin tear of left forearm without complication, initial encounter   [facility] [name], MD   3 months ago Uncontrolled type 2 diabetes mellitus with hyperglycemia [facility])   [facility] [name], MD   5 months ago Uncontrolled type 2 diabetes mellitus with hyperglycemia [facility])   [facility] Gasper, Nancyann BRAVO, MD

## 2023-08-23 MED ORDER — HYDROCODONE-ACETAMINOPHEN 5-325 MG PO TABS: 1.0000 | ORAL_TABLET | Freq: Four times a day (QID) | ORAL | 0 refills | Status: DC | PRN

## 2023-08-25 ENCOUNTER — Other Ambulatory Visit (HOSPITAL_COMMUNITY): Payer: Self-pay

## 2023-08-25 ENCOUNTER — Other Ambulatory Visit: Payer: Self-pay

## 2023-08-31 ENCOUNTER — Other Ambulatory Visit: Payer: Self-pay

## 2023-09-04 ENCOUNTER — Other Ambulatory Visit: Payer: Self-pay

## 2023-09-04 ENCOUNTER — Other Ambulatory Visit: Payer: Self-pay | Admitting: Family Medicine

## 2023-09-04 DIAGNOSIS — E1165 Type 2 diabetes mellitus with hyperglycemia: Secondary | ICD-10-CM

## 2023-09-04 MED FILL — Metformin HCl Tab ER 24HR 500 MG: ORAL | 90 days supply | Qty: 90 | Fill #0 | Status: AC

## 2023-09-07 ENCOUNTER — Ambulatory Visit: Payer: Self-pay | Admitting: Family Medicine

## 2023-09-09 ENCOUNTER — Encounter: Payer: Self-pay | Admitting: Family Medicine

## 2023-09-09 ENCOUNTER — Ambulatory Visit (INDEPENDENT_AMBULATORY_CARE_PROVIDER_SITE_OTHER): Payer: Self-pay | Admitting: Family Medicine

## 2023-09-09 DIAGNOSIS — Z7985 Long-term (current) use of injectable non-insulin antidiabetic drugs: Secondary | ICD-10-CM

## 2023-09-09 DIAGNOSIS — E1169 Type 2 diabetes mellitus with other specified complication: Secondary | ICD-10-CM

## 2023-09-10 ENCOUNTER — Ambulatory Visit: Payer: Self-pay | Admitting: Family Medicine

## 2023-09-10 DIAGNOSIS — E1165 Type 2 diabetes mellitus with hyperglycemia: Secondary | ICD-10-CM

## 2023-09-10 LAB — RENAL FUNCTION PANEL
Albumin: 4.3 g/dL (ref 3.9–4.9)
BUN/Creatinine Ratio: 26 — ABNORMAL HIGH (ref 10–24)
BUN: 50 mg/dL — ABNORMAL HIGH (ref 8–27)
CO2: 24 mmol/L (ref 20–29)
Calcium: 9.6 mg/dL (ref 8.6–10.2)
Chloride: 93 mmol/L — ABNORMAL LOW (ref 96–106)
Creatinine, Ser: 1.89 mg/dL — ABNORMAL HIGH (ref 0.76–1.27)
Glucose: 154 mg/dL — ABNORMAL HIGH (ref 70–99)
Phosphorus: 4.2 mg/dL — ABNORMAL HIGH (ref 2.8–4.1)
Potassium: 4.7 mmol/L (ref 3.5–5.2)
Sodium: 137 mmol/L (ref 134–144)
eGFR: 38 mL/min/1.73 — ABNORMAL LOW (ref >59)

## 2023-09-10 LAB — HEMOGLOBIN A1C
Est. average glucose Bld gHb Est-mCnc: 214 mg/dL
Hgb A1c MFr Bld: 9.1 % — ABNORMAL HIGH (ref 4.8–5.6)

## 2023-09-10 LAB — LIPID PANEL
Chol/HDL Ratio: 8 ratio — ABNORMAL HIGH (ref 0.0–5.0)
Cholesterol, Total: 169 mg/dL (ref 100–199)
HDL: 21 mg/dL — ABNORMAL LOW (ref >39)
LDL Chol Calc (NIH): 46 mg/dL (ref 0–99)
Triglycerides: 715 mg/dL (ref 0–149)
VLDL Cholesterol Cal: 102 mg/dL — ABNORMAL HIGH (ref 5–40)

## 2023-09-10 LAB — MICROALBUMIN / CREATININE URINE RATIO
Creatinine, Urine: 96.3 mg/dL
Microalb/Creat Ratio: 739 mg/g{creat} — ABNORMAL HIGH (ref 0–29)
Microalbumin, Urine: 711.6 ug/mL

## 2023-09-11 ENCOUNTER — Telehealth: Payer: Self-pay

## 2023-09-11 NOTE — Progress Notes (Signed)
 Care Guide Pharmacy Note  09/11/2023 Name: [name] MRN: [id number] DOB: [birth year]  Referred By: [name], MD Reason for referral: Complex Care Management and Call Attempt #1 (Unsuccessful initial outreach to schedule with PHARM D- [name])   Curtis [name] is a 68 y.o. year old male who is a primary care patient of Gasper, Nancyann BRAVO, MD.  [name] was referred to the pharmacist for assistance related to: DMII  An unsuccessful telephone outreach was attempted today to contact the patient who was referred to the pharmacy team for assistance with medication assistance. Additional attempts will be made to contact the patient.  Leotis Rase [facility], [facility] Guide  Direct Dial: [phone]  Fax [phone]

## 2023-09-14 NOTE — Progress Notes (Unsigned)
 Care Guide Pharmacy Note  09/14/2023 Name: Curtis [name] MRN: [id number] DOB: [birth year]  Referred By: [name], MD Reason for referral: Complex Care Management, Call Attempt #1 (Unsuccessful initial outreach to schedule with PHARM D- [name]), and Call Attempt #2 (Unsuccessful initial outreach to schedule with PHARM D- [name])   Curtis [name] is a 68 y.o. year old male who is a primary care patient of Gasper, Nancyann BRAVO, MD.  Curtis [name] Pack was referred to the pharmacist for assistance related to: DMII  A second unsuccessful telephone outreach was attempted today to contact the patient who was referred to the pharmacy team for assistance with medication assistance. Additional attempts will be made to contact the patient.  Curtis [name], [facility] Guide  Direct Dial: [phone]  Fax [phone]

## 2023-09-15 ENCOUNTER — Other Ambulatory Visit: Payer: Self-pay

## 2023-09-15 ENCOUNTER — Other Ambulatory Visit: Payer: Self-pay | Admitting: Family Medicine

## 2023-09-15 DIAGNOSIS — E782 Mixed hyperlipidemia: Secondary | ICD-10-CM

## 2023-09-15 MED ORDER — ATORVASTATIN CALCIUM 80 MG PO TABS
80.0000 mg | ORAL_TABLET | Freq: Every day | ORAL | 3 refills | Status: AC
  Filled 2023-09-15 – 2023-09-17 (×2): qty 90, 90d supply, fill #0
  Filled 2023-12-12: qty 90, 90d supply, fill #1

## 2023-09-15 NOTE — Progress Notes (Signed)
 Care [name] Pharmacy Note  09/15/2023 Name: Curtis [name] MRN: [id number] DOB: 1955/07/07  Referred By: [name], MD Reason for referral: Complex Care Management, Call Attempt #1 (Unsuccessful initial outreach to schedule with PHARM DGLENWOOD Keeling), Call Attempt #2 (Unsuccessful initial outreach to schedule with PHARM D- [name]), and Call Attempt #3 (Unsuccessful initial outreach to schedule with PHARM D- [name])   Curtis [name] is a 68 y.o. year old male who is a primary care patient of Gasper, Nancyann BRAVO, MD.  Curtis [name]  A third unsuccessful telephone outreach was attempted today to contact the patient who was referred to the pharmacy team for assistance with medication assistance. The Population [name] team is pleased to engage with this patient at any time in the future upon receipt of referral and should he/she be interested in assistance from the [facility] [name] team.  Curtis [name]  Value-Based Care [name], Curtis [name]  Direct Dial: [phone]  Fax [phone]

## 2023-09-16 ENCOUNTER — Other Ambulatory Visit: Payer: Self-pay

## 2023-09-16 ENCOUNTER — Ambulatory Visit (INDEPENDENT_AMBULATORY_CARE_PROVIDER_SITE_OTHER): Payer: Self-pay

## 2023-09-16 ENCOUNTER — Telehealth: Payer: Self-pay

## 2023-09-16 ENCOUNTER — Encounter: Payer: Self-pay | Admitting: Family Medicine

## 2023-09-16 ENCOUNTER — Ambulatory Visit (INDEPENDENT_AMBULATORY_CARE_PROVIDER_SITE_OTHER): Payer: Self-pay | Admitting: Family Medicine

## 2023-09-16 DIAGNOSIS — S80829A Blister (nonthermal), unspecified lower leg, initial encounter: Secondary | ICD-10-CM

## 2023-09-16 DIAGNOSIS — Z7985 Long-term (current) use of injectable non-insulin antidiabetic drugs: Secondary | ICD-10-CM

## 2023-09-16 DIAGNOSIS — Z7984 Long term (current) use of oral hypoglycemic drugs: Secondary | ICD-10-CM

## 2023-09-16 DIAGNOSIS — E782 Mixed hyperlipidemia: Secondary | ICD-10-CM

## 2023-09-16 DIAGNOSIS — Z794 Long term (current) use of insulin: Secondary | ICD-10-CM

## 2023-09-16 DIAGNOSIS — E1165 Type 2 diabetes mellitus with hyperglycemia: Secondary | ICD-10-CM

## 2023-09-16 MED ORDER — METOLAZONE 2.5 MG PO TABS: 2.5000 mg | ORAL_TABLET | Freq: Every day | ORAL | 3 refills | Status: DC

## 2023-09-16 MED ORDER — ATORVASTATIN CALCIUM 80 MG PO TABS: 80.0000 mg | ORAL_TABLET | Freq: Every day | ORAL | 3 refills | Status: DC

## 2023-09-16 NOTE — Telephone Encounter (Signed)
 Gave pt a call to see if we can do PAP Novo Nordisk Ozempic  online, spoke with pt gave consent to do pap online and faxed provider portion today. To be follow.

## 2023-09-16 NOTE — Progress Notes (Unsigned)
 S:     Chief Complaint  Patient presents with   Diabetes   Reason for visit: ?  Curtis [name] is a 68 y.o. male with a history of diabetes (type 2), who presents today for an initial diabetes pharmacotherapy visit.? Pertinent PMH also includes HFrEF, ED, HLD, s/p CABG x 5, and hx of kidney stones.  Care Team: Primary Care Provider: [name], MD  Patient has been unable to tolerate higher doses of Ozempic  historically d/t GI upset.   Current diabetes medications include: glipizide  10 mg BID, Jardiance  10 mg dialy, metformin  XR 500 mg daily, Ozempic  1 mg weekly Previous diabetes medications include: Ozempic  2 mg (GI upset), Rybelsus   Current hypertension medications include: Toprol  XL 50 mg daily, metolazone  2.5 mg daily, furosemide  20 mg 2 tablets daily, Entresto  91/103 mg BID,  Current hyperlipidemia medications include: atorvastatin  80 mg daily  Patient reports adherence to taking all medications as prescribed.   Have you been experiencing any side effects to the medications prescribed? Yes - diarrhea with Ozempic  (keeps Imodium on hand) Do you have any problems obtaining medications due to transportation or finances? Yes - uses [facility] and PAP for medications Insurance coverage: self-pay  Patient denies hypoglycemic events.  Reported home fasting blood sugars: 150-160 mg/dL   Patient reports nocturia (nighttime urination).  Patient reports neuropathy (nerve pain).  Patient reported dietary habits: Eats 2 meals/day Breakfast: eggs, toast, cantaloupe  Lunch: skips Dinner: fish, chicken, frozen veggies,  Snacks: oyster crackers, pureed nuts Drinks: milk, Diet Coke, unsweetened tea *does not drink water   Patient-reported exercise habits: limited as pt is wheelchair bound; leg cycling in chair and lifts small weights daily DM Prevention:  Statin: Taking; high intensity.?  History of albuminuria? yes, last UACR on 09/09/23 = 739 mg/g Last eye exam:  Lab Results   Component Value Date   HMDIABEYEEXA Retinopathy (A) 04/03/2023   Last foot exam: No foot exam found Tobacco Use:   Tobacco Use: Medium Risk (09/16/2023)   Patient History    Smoking Tobacco Use: Former    Smokeless Tobacco Use: Never    Passive Exposure: Not on file   O:   Vitals:  Wt Readings from Last 3 Encounters:  09/16/23 172 lb 14.4 oz (78.4 kg)  09/09/23 173 lb (78.5 kg)  05/29/23 178 lb 2.1 oz (80.8 kg)   BP Readings from Last 3 Encounters:  09/16/23 122/72  09/09/23 121/71  05/29/23 118/67   Pulse Readings from Last 3 Encounters:  09/16/23 78  09/09/23 79  05/29/23 80     Labs:?  Lab Results  Component Value Date   HGBA1C 9.1 (H) 09/09/2023   HGBA1C 9.7 (A) 05/22/2023   HGBA1C 10.6 (A) 02/27/2023   GLUCOSE 154 (H) 09/09/2023   MICRALBCREAT 739 (H) 09/09/2023   MICRALBCREAT 5,272 (H) 06/06/2022   CREATININE 1.89 (H) 09/09/2023   CREATININE 1.60 (H) 05/22/2023   CREATININE 1.69 (H) 03/31/2023    Lab Results  Component Value Date   CHOL 169 09/09/2023   LDLCALC 46 09/09/2023   LDLCALC Comment (A) 05/22/2023   LDLCALC Comment (A) 06/06/2022   LDLDIRECT 16.7 12/05/2020   HDL 21 (L) 09/09/2023   TRIG 715 (HH) 09/09/2023   TRIG 972 (HH) 05/22/2023   TRIG 881 (HH) 06/06/2022   ALT 15 05/22/2023   ALT 17 10/07/2021   AST 15 05/22/2023   AST 16 10/07/2021      Chemistry      Component Value Date/Time  NA 137 09/09/2023 1315   K 4.7 09/09/2023 1315   CL 93 (L) 09/09/2023 1315   CO2 24 09/09/2023 1315   BUN 50 (H) 09/09/2023 1315   CREATININE 1.89 (H) 09/09/2023 1315      Component Value Date/Time   CALCIUM  9.6 09/09/2023 1315   ALKPHOS 73 05/22/2023 1334   AST 15 05/22/2023 1334   ALT 15 05/22/2023 1334   BILITOT 0.2 05/22/2023 1334       The ASCVD Risk score (Arnett DK, et al., 2019) failed to calculate for the following reasons:   Risk score cannot be calculated because patient has a medical history suggesting prior/existing  ASCVD  Lab Results  Component Value Date   MICRALBCREAT 739 (H) 09/09/2023   MICRALBCREAT 5,272 (H) 06/06/2022    A/P: Diabetes currently uncontrolled with a most recent A1c of 9.1% on 09/09/23, which is down from 9.7% on 05/22/23. Patient is able to verbalize appropriate hypoglycemia management plan. Medication adherence appears appropriate. Patient is interested in utilizing CGM in the future to assist with BG monitoring. Will place and educate at follow up.  -Started basal insulin  Basaglar (insulin  glargine) 10 units once daily in the morning -Continued GLP-1 Ozempic  (semaglutide ) 1 mg. Provided phone number for pharmacy technician, Ana, to reenroll in PAP. -Continued SGLT2-I Jardiance  (empagliflozin ) 10 mg.  -Continued metformin  XR 500 mg daily.  -Continued glipizide  10 mg BID -Patient educated on purpose, proper use, and potential adverse effects of insulin .  -Extensively discussed pathophysiology of diabetes, recommended lifestyle interventions, dietary effects on blood sugar control.  -Counseled on s/sx of and management of hypoglycemia.  -Next A1c anticipated 11/2023.   ASCVD risk - secondary prevention in patient with diabetes. Last LDL is 46 mg/dL, at goal of <29 mg/dL. high intensity statin indicated.  -Continued atorvastatin  80 mg daily.   Written patient instructions provided. Patient verbalized understanding of treatment plan.  Total time in face to face counseling 45 minutes.     Follow-up:  Pharmacist on 10/14/23 PCP clinic visit on 01/18/24  [name], PharmD Clinical Pharmacist [facility] [phone]

## 2023-09-16 NOTE — Progress Notes (Signed)
 Patient here to day regarding starting insulin  for better control of diabetes. See result from 09/09/2023. Having some mild GI side effects of Ozempic  and doubt increasing dose would justify additional side effects. Did well with Mounjaro  in the past but he has no insurance unable to get patient assistance. May benefit from higher dose of Jardiance  but unlikely to get sugar to goal. Patient sent to discuss options and potential insulin  regiment and patient assistance with Pharm-D-[name]

## 2023-09-17 ENCOUNTER — Other Ambulatory Visit: Payer: Self-pay

## 2023-09-17 MED ORDER — BASAGLAR KWIKPEN 100 UNIT/ML ~~LOC~~ SOPN
10.0000 [IU] | PEN_INJECTOR | Freq: Every day | SUBCUTANEOUS | 2 refills | Status: DC
  Filled 2023-09-17 (×2): qty 15, 150d supply, fill #0
  Filled 2023-09-17: qty 15, 140d supply, fill #0
  Filled 2023-09-17: qty 15, 150d supply, fill #0

## 2023-09-17 MED ORDER — PEN NEEDLES 31G X 5 MM MISC
2 refills | Status: AC
  Filled 2023-09-17 (×2): qty 100, 30d supply, fill #0
  Filled 2023-11-30: qty 100, 30d supply, fill #1

## 2023-09-18 ENCOUNTER — Other Ambulatory Visit: Payer: Self-pay

## 2023-09-18 ENCOUNTER — Other Ambulatory Visit (HOSPITAL_BASED_OUTPATIENT_CLINIC_OR_DEPARTMENT_OTHER): Payer: Self-pay

## 2023-09-21 NOTE — Progress Notes (Signed)
 Established patient visit   Patient: Curtis [name]   DOB: 1955/10/01   68 y.o. Male  MRN: [id number] Visit Date: 09/09/2023  Today's healthcare provider: Nancyann Perry, MD   Chief Complaint  Patient presents with   Diabetes   Subjective    Discussed the use of AI scribe software for clinical note transcription with the patient, who gave verbal consent to proceed.  History of Present Illness   Curtis [name] is a 68 year old male with diabetes who presents for a check-up on his blood sugar levels.  His blood sugar levels have been inconsistent. He is currently taking Ozempic  at a dose of 1 mg. He experiences gastrointestinal side effects, specifically diarrhea, the day after taking the medication, which lasts for about a day. No stomach pain or severe cramping is associated with this.  The swelling in his feet and legs has been staying down. He is taking a diuretic, referred to as a 'fluid pill'. He has a few sores on one leg, which he attributes to bumping into objects, such as a coffee cup. These sores are covered with Band-Aids, and there are no blisters present.  He is managing his prescriptions well, with assistance, and recently signed a refill request. He has enough Ozempic  for the next month and is on a refill program that sends medication every three and a half months.  No stomach pain or severe cramping. No blisters on his legs.       Medications: Outpatient Medications Prior to Visit  Medication Sig   acetaminophen  (TYLENOL ) 500 MG tablet Take 2 tablets (1,000 mg total) by mouth every 6 (six) hours.   aspirin  81 MG chewable tablet Chew 1 tablet (81 mg total) by mouth daily.   famotidine  (PEPCID ) 40 MG tablet Take 1 tablet (40 mg total) by mouth daily.   furosemide  (LASIX ) 20 MG tablet Take 2 tablets (40 mg total) by mouth daily. NEEDS TO KEEP FOLLOW UP APPOINTMENT FOR MORE REFILLS   glipiZIDE  (GLUCOTROL ) 10 MG tablet Take 1 tablet (10 mg total) by mouth 2 (two)  times daily before a meal.   HYDROcodone -acetaminophen  (NORCO/VICODIN) 5-325 MG tablet Take 1 tablet by mouth every 6 (six) hours as needed for moderate pain (pain score 4-6).   JARDIANCE  10 MG TABS tablet TAKE ONE TABLET BY MOUTH EVERY MORNING BEFORE BREAKFAST   Lancet Devices MISC Use to check blood sugar twice a day   metFORMIN  (GLUCOPHAGE -XR) 500 MG 24 hr tablet Take 1 tablet (500 mg total) by mouth daily.   metolazone  (ZAROXOLYN ) 2.5 MG tablet Take 1 tablet (2.5 mg total) by mouth daily.   metoprolol  succinate (TOPROL -XL) 50 MG 24 hr tablet Take 1 tablet (50 mg total) by mouth daily.   sacubitril -valsartan  (ENTRESTO ) 97-103 MG TAKE 1 TABLET BY MOUTH 2 (TWO) TIMES DAILY.   Semaglutide , 1 MG/DOSE, 4 MG/3ML SOPN Inject 1 mg as directed once a week.   silver  sulfADIAZINE  (SILVADENE ) 1 % cream Apply 1 Application topically daily.   atorvastatin  (LIPITOR) 80 MG tablet Take 1 tablet (80 mg total) by mouth daily.   No facility-administered medications prior to visit.   Review of Systems  Constitutional:  Negative for appetite change, chills and fever.  Respiratory:  Negative for chest tightness, shortness of breath and wheezing.   Cardiovascular:  Negative for chest pain and palpitations.  Gastrointestinal:  Negative for abdominal pain, nausea and vomiting.       Objective    BP 121/71  Pulse 79   Ht 5' 8 (1.727 m)   Wt 173 lb (78.5 kg)   SpO2 97%   BMI 26.30 kg/m   Physical Exam   General: Appearance:    Well developed, well nourished male in no acute distress  Eyes:    PERRL, conjunctiva/corneas clear, EOM's intact       Lungs:     Clear to auscultation bilaterally, respirations unlabored  Heart:    Normal heart rate. Normal rhythm. No murmurs, rubs, or gallops.    MS:   All extremities are intact.    Neurologic:   Awake, alert, oriented x 3. No apparent focal neurological defect.         Assessment & Plan        Type 2 diabetes mellitus Managed with Ozempic  1 mg.  Concerns about gastrointestinal side effects with increased dosage. Awaiting lab results for A1c and kidney function to assess control. - Order A1c and kidney function tests. - Ensure he is on refill program for Ozempic . - Review lab results and adjust treatment if necessary, avoiding increase in Ozempic  dose. May need basal insulin  if A1c is well above goal.   Edema of lower extremities No new blisters or significant swelling. - Continue metolazone     No follow-ups on file.     Nancyann Perry, MD  [facility] [phone] (phone) [phone] (fax)  [facility]

## 2023-09-22 NOTE — Telephone Encounter (Signed)
 Received provider portion of PAP Novo Nordisk Ozempic  today, faxed it to Novo Nordisk will follow up in a couple of days.

## 2023-09-24 ENCOUNTER — Other Ambulatory Visit (HOSPITAL_COMMUNITY): Payer: Self-pay

## 2023-09-24 NOTE — Telephone Encounter (Signed)
 Gave Novo Nordisk a call to follow up on PAP Novo Nordisk Ozempic ,spoke with a representative said pt needs to provide with Ins card Imf. Novo Nordisk is showing pt has Medicared part D and want a copy of thet card,call pt left a HIPAA VM.

## 2023-09-29 ENCOUNTER — Other Ambulatory Visit: Payer: Self-pay

## 2023-09-29 ENCOUNTER — Other Ambulatory Visit (HOSPITAL_COMMUNITY): Payer: Self-pay

## 2023-09-29 NOTE — Telephone Encounter (Signed)
 Patient presented to clinic to sign paperwork for PAP. Faxed documents to Pharmacologist, [place].

## 2023-09-29 NOTE — Telephone Encounter (Deleted)
 Opened in error

## 2023-09-29 NOTE — Telephone Encounter (Signed)
 Gave [employer] a call to follow up on application spoke with a representative ask to have pt sign pg#1 to sig and date and fax back to [employer] attn pt PI#[id number],tpoo mail out PAP for pt to sign and date and return back,also they ask to re-send provider portion with provider license number.faxed it today with provider license number.gave pt a call left a HIPAA VM. Will follow up with.pt does not have Ins. At this time pt only have workers comp.

## 2023-09-29 NOTE — Telephone Encounter (Signed)
Opened in error. See previous telephone encounter.

## 2023-09-30 NOTE — Telephone Encounter (Signed)
 Received pt portion of missing imf faxed it to [employer] today,will follow up in a few days.

## 2023-10-05 NOTE — Telephone Encounter (Signed)
 Received approval  letter Fom [employer] Ozempic  pt is approve thru 01/20/24 on Ozempic . Left a HIPAA VM

## 2023-10-09 ENCOUNTER — Telehealth: Payer: Self-pay | Admitting: Internal Medicine

## 2023-10-14 ENCOUNTER — Ambulatory Visit (INDEPENDENT_AMBULATORY_CARE_PROVIDER_SITE_OTHER): Payer: Self-pay

## 2023-10-14 ENCOUNTER — Ambulatory Visit: Payer: Self-pay

## 2023-10-14 ENCOUNTER — Other Ambulatory Visit: Payer: Self-pay

## 2023-10-14 DIAGNOSIS — Z7984 Long term (current) use of oral hypoglycemic drugs: Secondary | ICD-10-CM

## 2023-10-14 DIAGNOSIS — Z7985 Long-term (current) use of injectable non-insulin antidiabetic drugs: Secondary | ICD-10-CM

## 2023-10-14 DIAGNOSIS — E1165 Type 2 diabetes mellitus with hyperglycemia: Secondary | ICD-10-CM

## 2023-10-14 DIAGNOSIS — Z794 Long term (current) use of insulin: Secondary | ICD-10-CM

## 2023-10-14 MED ORDER — FREESTYLE LIBRE 3 PLUS SENSOR MISC
3 refills | Status: AC
  Filled 2023-10-14: qty 2, fill #0
  Filled 2023-10-27: qty 2, 30d supply, fill #0
  Filled 2023-11-30: qty 2, 30d supply, fill #1
  Filled 2023-12-29 (×2): qty 2, 30d supply, fill #2
  Filled 2024-01-29: qty 2, 30d supply, fill #3

## 2023-10-14 MED ORDER — BASAGLAR KWIKPEN 100 UNIT/ML ~~LOC~~ SOPN
16.0000 [IU] | PEN_INJECTOR | Freq: Every day | SUBCUTANEOUS | Status: DC
Start: 2023-10-14 — End: 2023-11-18

## 2023-10-14 NOTE — Progress Notes (Signed)
 S:     Chief Complaint  Patient presents with   Diabetes   Reason for visit: ?  Curtis [name] is a 68 y.o. male with a history of diabetes (type 2), who presents today for an initial diabetes pharmacotherapy visit.? Pertinent PMH also includes HFrEF, ED, HLD, s/p CABG x 5, and hx of kidney stones.  Care Team: Primary Care Provider: [name], MD  Patient has been unable to tolerate higher doses of Ozempic  historically d/t GI upset.   At last visit with clinical pharmacist on 09/16/23, patient was started on Basaglar  10 units daily.   Today, patient presented for education on Freestyle Libre 3 CGM.   Current diabetes medications include: glipizide  10 mg BID, Jardiance  10 mg daily, metformin  XR 500 mg daily, Ozempic  1 mg weekly, Basaglar  10 units daily Previous diabetes medications include: Ozempic  2 mg (GI upset), Rybelsus   Current hypertension medications include: Toprol  XL 50 mg daily, metolazone  2.5 mg daily, furosemide  20 mg 2 tablets daily, Entresto  91/103 mg BID,  Current hyperlipidemia medications include: atorvastatin  80 mg daily  Patient reports adherence to taking all medications as prescribed.   Have you been experiencing any side effects to the medications prescribed? Yes - diarrhea with Ozempic  (keeps Imodium on hand) Do you have any problems obtaining medications due to transportation or finances? Yes - uses [facility] and PAP for medications Insurance coverage: self-pay  Patient denies hypoglycemic events.  Reported home fasting blood sugars: 130-190 mg/dL   Patient reports nocturia (nighttime urination).  Patient reports neuropathy (nerve pain).  Patient reported dietary habits: Eats 2 meals/day Breakfast: eggs, toast, cantaloupe  Lunch: skips Dinner: fish, chicken, frozen veggies,  Snacks: oyster crackers, pureed nuts Drinks: milk, Diet Coke, unsweetened tea *does not drink water   Patient-reported exercise habits: limited as pt is wheelchair bound;  leg cycling in chair and lifts small weights daily DM Prevention:  Statin: Taking; high intensity.?  History of albuminuria? yes, last UACR on 09/09/23 = 739 mg/g Last eye exam: scheduled this upcoming month Lab Results  Component Value Date   HMDIABEYEEXA Retinopathy (A) 04/03/2023   Last foot exam: No foot exam found Tobacco Use:   Tobacco Use: Medium Risk (09/16/2023)   Patient History    Smoking Tobacco Use: Former    Smokeless Tobacco Use: Never    Passive Exposure: Not on file   O:   Vitals:  Wt Readings from Last 3 Encounters:  09/16/23 172 lb 14.4 oz (78.4 kg)  09/09/23 173 lb (78.5 kg)  05/29/23 178 lb 2.1 oz (80.8 kg)   BP Readings from Last 3 Encounters:  09/16/23 122/72  09/09/23 121/71  05/29/23 118/67   Pulse Readings from Last 3 Encounters:  09/16/23 78  09/09/23 79  05/29/23 80     Labs:?  Lab Results  Component Value Date   HGBA1C 9.1 (H) 09/09/2023   HGBA1C 9.7 (A) 05/22/2023   HGBA1C 10.6 (A) 02/27/2023   GLUCOSE 154 (H) 09/09/2023   MICRALBCREAT 739 (H) 09/09/2023   MICRALBCREAT 5,272 (H) 06/06/2022   CREATININE 1.89 (H) 09/09/2023   CREATININE 1.60 (H) 05/22/2023   CREATININE 1.69 (H) 03/31/2023    Lab Results  Component Value Date   CHOL 169 09/09/2023   LDLCALC 46 09/09/2023   LDLCALC Comment (A) 05/22/2023   LDLCALC Comment (A) 06/06/2022   LDLDIRECT 16.7 12/05/2020   HDL 21 (L) 09/09/2023   TRIG 715 (HH) 09/09/2023   TRIG 972 (HH) 05/22/2023   TRIG 881 (HH)  06/06/2022   ALT 15 05/22/2023   ALT 17 10/07/2021   AST 15 05/22/2023   AST 16 10/07/2021      Chemistry      Component Value Date/Time   NA 137 09/09/2023 1315   K 4.7 09/09/2023 1315   CL 93 (L) 09/09/2023 1315   CO2 24 09/09/2023 1315   BUN 50 (H) 09/09/2023 1315   CREATININE 1.89 (H) 09/09/2023 1315      Component Value Date/Time   CALCIUM  9.6 09/09/2023 1315   ALKPHOS 73 05/22/2023 1334   AST 15 05/22/2023 1334   ALT 15 05/22/2023 1334   BILITOT 0.2  05/22/2023 1334       The ASCVD Risk score (Arnett DK, et al., 2019) failed to calculate for the following reasons:   Risk score cannot be calculated because patient has a medical history suggesting prior/existing ASCVD  Lab Results  Component Value Date   MICRALBCREAT 739 (H) 09/09/2023   MICRALBCREAT 5,272 (H) 06/06/2022    A/P: Diabetes currently uncontrolled with a most recent A1c of 9.1% on 09/09/23, which is down from 9.7% on 05/22/23. Patient is able to verbalize appropriate hypoglycemia management plan. Medication adherence appears appropriate. Fasting BG levels still above goal ranging from 130-190 mg/dL. Will continue to titrate insulin  at this time. -Increased dose of basal insulin  Basaglar (insulin  glargine) 16 units once daily in the morning. May enroll in PAP at follow up visit for [place].  -Continued GLP-1 Ozempic  (semaglutide ) 1 mg.  -Continued SGLT2-I Jardiance  (empagliflozin ) 10 mg.  -Continued metformin  XR 500 mg daily.  -Continued glipizide  10 mg BID -Patient educated on purpose, proper use, and potential adverse effects of insulin .  -Extensively discussed pathophysiology of diabetes, recommended lifestyle interventions, dietary effects on blood sugar control.  -Counseled on s/sx of and management of hypoglycemia.  -Next A1c anticipated 11/2023.  -Placed FSL3 CGM in visit today and provided education on use.   ASCVD risk - secondary prevention in patient with diabetes. Last LDL is 46 mg/dL, at goal of <29 mg/dL. high intensity statin indicated.  -Continued atorvastatin  80 mg daily.   Patient verbalized understanding of treatment plan. Total time patient counseling 30 minutes.  Follow-up:  Pharmacist on 11/13/23 PCP clinic visit on 01/18/24  [name], PharmD Clinical Pharmacist [facility] [phone]

## 2023-10-27 ENCOUNTER — Other Ambulatory Visit: Payer: Self-pay

## 2023-10-27 ENCOUNTER — Other Ambulatory Visit: Payer: Self-pay | Admitting: Family Medicine

## 2023-10-27 DIAGNOSIS — G8929 Other chronic pain: Secondary | ICD-10-CM

## 2023-10-27 NOTE — Telephone Encounter (Unsigned)
 Copied from CRM [id number]. Topic: Clinical - Medication Refill >> Oct 27, 2023 10:27 AM Montie POUR wrote: Medication: HYDROcodone -acetaminophen  (NORCO/VICODIN) 5-325 MG tablet   Has the patient contacted their pharmacy? No (Agent: If no, request that the patient contact the pharmacy for the refill. If patient does not wish to contact the pharmacy document the reason why and proceed with request.) (Agent: If yes, when and what did the pharmacy advise?) Garrel has to call doctor's office to get refills monthly  This is the patient's preferred pharmacy:   [id number] [place], [place] - [address] AT [facility] OF [address] [place] [place] [address] Phone: [phone] Fax: [phone]   Is this the correct pharmacy for this prescription? Yes If no, delete pharmacy and type the correct one.   Has the prescription been filled recently? No  Is the patient out of the medication? No  Has the patient been seen for an appointment in the last year OR does the patient have an upcoming appointment? Yes  Can we respond through MyChart? Yes  Agent: Please be advised that Rx refills may take up to 3 business days. We ask that you follow-up with your pharmacy.

## 2023-10-29 MED ORDER — HYDROCODONE-ACETAMINOPHEN 5-325 MG PO TABS: 1.0000 | ORAL_TABLET | Freq: Four times a day (QID) | ORAL | 0 refills | Status: DC | PRN

## 2023-10-29 NOTE — Telephone Encounter (Signed)
 Requested medication (s) are due for refill today: yes  Requested medication (s) are on the active medication list: yes  Last refill:  08/23/23  Future visit scheduled: yes  Notes to clinic:  Unable to refill per protocol, cannot delegate.      Requested Prescriptions  Pending Prescriptions Disp Refills   HYDROcodone -acetaminophen  (NORCO/VICODIN) 5-325 MG tablet 30 tablet 0    Sig: Take 1 tablet by mouth every 6 (six) hours as needed for moderate pain (pain score 4-6).     Not Delegated - Analgesics:  Opioid Agonist Combinations Failed - 10/29/2023  9:09 AM      Failed - This refill cannot be delegated      Failed - Urine Drug Screen completed in last 360 days      Passed - Valid encounter within last 3 months    Recent Outpatient Visits           2 weeks ago Uncontrolled type 2 diabetes mellitus with hyperglycemia [facility])   [facility] [place], [place] E, RPH   1 month ago Uncontrolled type 2 diabetes mellitus with hyperglycemia [facility])   [place] Issac Keeling E, RPH   1 month ago Blister (nonthermal), unspecified lower leg, initial encounter   [facility] [name], MD   1 month ago Type 2 diabetes mellitus with other specified complication, without long-term current use of insulin  [facility])    [facility] [name], MD   5 months ago Skin tear of left forearm without complication, initial encounter   [facility] [name], MD       Future Appointments             In 2 months Fisher, Nancyann BRAVO, MD [facility] [place]

## 2023-11-11 ENCOUNTER — Other Ambulatory Visit: Payer: Self-pay

## 2023-11-11 ENCOUNTER — Telehealth: Payer: Self-pay | Admitting: Adult Health

## 2023-11-11 NOTE — Telephone Encounter (Signed)
 Called to confirm/remind patient of their appointment at the [facility] on 11/12/23.   Appointment:   [] Confirmed  [] Left mess   [] No answer/No voice mail  [x] VM Full/unable to leave message  [] Phone not in service  Patient reminded to bring all medications and/or complete list.  Confirmed patient has transportation. Gave directions, instructed to utilize valet parking.

## 2023-11-12 ENCOUNTER — Other Ambulatory Visit
Admission: RE | Admit: 2023-11-12 | Discharge: 2023-11-12 | Disposition: A | Discharge status: Home / Self Care | Payer: Self-pay | Source: Ambulatory Visit | Attending: Adult Health | Admitting: Adult Health

## 2023-11-12 ENCOUNTER — Ambulatory Visit (HOSPITAL_COMMUNITY): Payer: Self-pay | Admitting: Adult Health

## 2023-11-12 ENCOUNTER — Ambulatory Visit (HOSPITAL_BASED_OUTPATIENT_CLINIC_OR_DEPARTMENT_OTHER): Payer: Self-pay | Admitting: Adult Health

## 2023-11-12 ENCOUNTER — Encounter: Payer: Self-pay | Admitting: Adult Health

## 2023-11-12 ENCOUNTER — Other Ambulatory Visit: Payer: Self-pay

## 2023-11-12 VITALS — BP 122/63 | HR 80 | Ht 68.0 in | Wt 174.1 lb

## 2023-11-12 DIAGNOSIS — Z951 Presence of aortocoronary bypass graft: Secondary | ICD-10-CM | POA: Insufficient documentation

## 2023-11-12 DIAGNOSIS — Z993 Dependence on wheelchair: Secondary | ICD-10-CM | POA: Insufficient documentation

## 2023-11-12 DIAGNOSIS — E1165 Type 2 diabetes mellitus with hyperglycemia: Secondary | ICD-10-CM | POA: Insufficient documentation

## 2023-11-12 DIAGNOSIS — I2510 Atherosclerotic heart disease of native coronary artery without angina pectoris: Secondary | ICD-10-CM

## 2023-11-12 DIAGNOSIS — S80829A Blister (nonthermal), unspecified lower leg, initial encounter: Secondary | ICD-10-CM

## 2023-11-12 DIAGNOSIS — Z794 Long term (current) use of insulin: Secondary | ICD-10-CM | POA: Insufficient documentation

## 2023-11-12 DIAGNOSIS — Z7985 Long-term (current) use of injectable non-insulin antidiabetic drugs: Secondary | ICD-10-CM | POA: Insufficient documentation

## 2023-11-12 DIAGNOSIS — E785 Hyperlipidemia, unspecified: Secondary | ICD-10-CM | POA: Insufficient documentation

## 2023-11-12 DIAGNOSIS — Z79899 Other long term (current) drug therapy: Secondary | ICD-10-CM | POA: Insufficient documentation

## 2023-11-12 DIAGNOSIS — I110 Hypertensive heart disease with heart failure: Secondary | ICD-10-CM | POA: Insufficient documentation

## 2023-11-12 DIAGNOSIS — I252 Old myocardial infarction: Secondary | ICD-10-CM | POA: Insufficient documentation

## 2023-11-12 DIAGNOSIS — I5042 Chronic combined systolic (congestive) and diastolic (congestive) heart failure: Secondary | ICD-10-CM | POA: Insufficient documentation

## 2023-11-12 DIAGNOSIS — Z7982 Long term (current) use of aspirin: Secondary | ICD-10-CM | POA: Insufficient documentation

## 2023-11-12 DIAGNOSIS — Z7984 Long term (current) use of oral hypoglycemic drugs: Secondary | ICD-10-CM | POA: Insufficient documentation

## 2023-11-12 DIAGNOSIS — Z87891 Personal history of nicotine dependence: Secondary | ICD-10-CM | POA: Insufficient documentation

## 2023-11-12 DIAGNOSIS — I5032 Chronic diastolic (congestive) heart failure: Secondary | ICD-10-CM

## 2023-11-12 LAB — BASIC METABOLIC PANEL WITH GFR
Anion gap: 17 — ABNORMAL HIGH (ref 5–15)
BUN: 48 mg/dL — ABNORMAL HIGH (ref 8–23)
CO2: 26 mmol/L (ref 22–32)
Calcium: 9.2 mg/dL (ref 8.9–10.3)
Chloride: 93 mmol/L — ABNORMAL LOW (ref 98–111)
Creatinine, Ser: 1.8 mg/dL — ABNORMAL HIGH (ref 0.61–1.24)
GFR, Estimated: 40 mL/min — ABNORMAL LOW (ref >60)
Glucose, Bld: 213 mg/dL — ABNORMAL HIGH (ref 70–99)
Potassium: 3.9 mmol/L (ref 3.5–5.1)
Sodium: 136 mmol/L (ref 135–145)

## 2023-11-12 MED ORDER — METOLAZONE 2.5 MG PO TABS
2.5000 mg | ORAL_TABLET | ORAL | 5 refills | Status: AC
  Filled 2023-11-12 – 2023-11-30 (×2): qty 12, 28d supply, fill #0
  Filled 2023-12-29 (×2): qty 12, 28d supply, fill #1
  Filled 2024-01-29: qty 12, 28d supply, fill #2
  Filled 2024-02-24: qty 12, 28d supply, fill #3

## 2023-11-12 NOTE — Patient Instructions (Signed)
 Medication Changes:  DECREASE Metolazone  2.5mg  (1 tab) every Monday, Wednesday, and Friday.  Lab Work:   Go downstairs to [place] on LOWER LEVEL to have your blood work completed.  We will only call you if the results are abnormal or if the provider would like to make medication changes.  No news is good news.    Follow-Up in: Please follow up with the Advanced [facility] Clinic in 6 months with Dr. Cherrie in [place].    Thank you for choosing [place] [facility].    At the [facility], you and your health needs are our priority. We have a designated team specialized in the treatment of Heart Failure. This Care Team includes your primary Heart Failure Specialized Cardiologist (physician), Advanced Practice Providers (APPs- Physician Assistants and Nurse Practitioners), and Pharmacist who all work together to provide you with the care you need, when you need it.   You may see any of the following providers on your designated Care Team at your next follow up:  Dr. Toribio Cherrie Dr. Ezra Shuck Dr. Ria Commander Dr. Morene Brownie Ellouise Class, FNP Jaun Bash, RPH-CPP  Please be sure to bring in all your medications bottles to every appointment.   Need to Contact Us :  If you have any questions or concerns before your next appointment please send us  a message through [place] or call our office at [phone].    TO LEAVE A MESSAGE FOR THE NURSE SELECT OPTION 2, PLEASE LEAVE A MESSAGE INCLUDING: YOUR NAME DATE OF BIRTH CALL BACK NUMBER REASON FOR CALL**this is important as we prioritize the call backs  YOU WILL RECEIVE A CALL BACK THE SAME DAY AS LONG AS YOU CALL BEFORE 4:00 PM
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
## 2023-11-13 ENCOUNTER — Encounter: Payer: Self-pay | Admitting: Internal Medicine

## 2023-11-18 ENCOUNTER — Other Ambulatory Visit: Payer: Self-pay

## 2023-11-18 ENCOUNTER — Ambulatory Visit (INDEPENDENT_AMBULATORY_CARE_PROVIDER_SITE_OTHER): Payer: Self-pay

## 2023-11-18 DIAGNOSIS — Z7985 Long-term (current) use of injectable non-insulin antidiabetic drugs: Secondary | ICD-10-CM

## 2023-11-18 DIAGNOSIS — Z7984 Long term (current) use of oral hypoglycemic drugs: Secondary | ICD-10-CM

## 2023-11-18 DIAGNOSIS — E1165 Type 2 diabetes mellitus with hyperglycemia: Secondary | ICD-10-CM

## 2023-11-18 DIAGNOSIS — Z794 Long term (current) use of insulin: Secondary | ICD-10-CM

## 2023-11-18 MED ORDER — BASAGLAR KWIKPEN 100 UNIT/ML ~~LOC~~ SOPN
20.0000 [IU] | PEN_INJECTOR | Freq: Every day | SUBCUTANEOUS | 1 refills | Status: DC
Start: 2023-11-18 — End: 2023-12-09
  Filled 2023-11-18 (×2): qty 15, 75d supply, fill #0

## 2023-11-18 MED FILL — Metformin HCl Tab ER 24HR 500 MG: ORAL | 90 days supply | Qty: 90 | Fill #1 | Status: CN

## 2023-11-18 NOTE — Patient Instructions (Signed)
 Hello Curtis [name],   We wanted to let you know about an important upcoming change that may impact one of your prescribed medications. [employer], the maker of Ozempic  and Rybelsus , has announced that they will no longer offer these medications to Medicare beneficiaries through their Patient Assistance Program in 2026.   This means that if you currently receive Ozempic  or Rybelsus  at no cost through the program, starting in January 2026 you'll need to use your insurance to continue on these medicines.   Please review the below information.   There are Medicare Specialists through the [place] [facility] Insurance Information Program ([place]) that can help you shop for Medicare plans.   [place] [facility] toll free number: [phone] (Monday - Friday 8 am - 5 pm)  There are representatives located in each county:   [place] [facility] [address]     [place]  [address] [phone] Call for an appointment

## 2023-11-18 NOTE — Progress Notes (Signed)
 S:     Reason for visit: ?  Curtis [name] is a 68 y.o. male with a history of diabetes (type 2), who presents today for an initial diabetes pharmacotherapy visit.? Pertinent PMH also includes HFrEF, ED, HLD, s/p CABG x 5, and hx of kidney stones.  Care Team: Primary Care Provider: [name], MD  At last visit with HF NP, patient appeared euvolemia and metolazone  was decreased from 2.5 mg daily to 3 days a week.   At last visit with clinical pharmacist on 09/16/23, patient was started on Basaglar  10 units daily.   Current diabetes medications include: glipizide  10 mg BID, Jardiance  10 mg daily, metformin  XR 500 mg daily, Ozempic  1 mg weekly, Basaglar  16 units daily Previous diabetes medications include: Ozempic  2 mg (GI upset), Rybelsus   Current hypertension medications include: Toprol  XL 50 mg daily, metolazone  2.5 mg Mon/Wed/Fri, furosemide  20 mg 2 tablets daily, Entresto  97/103 mg BID Current hyperlipidemia medications include: atorvastatin  80 mg daily  Patient reports adherence to taking all medications as prescribed.   Have you been experiencing any side effects to the medications prescribed? Yes - diarrhea with Ozempic  (keeps Imodium on hand) Do you have any problems obtaining medications due to transportation or finances? Yes - uses DOH and PAP for medications Insurance coverage: self-pay  Patient denies hypoglycemic events.  Patient reports neuropathy (nerve pain).  Patient reported dietary habits: Eats 2 meals/day Breakfast: eggs, toast, cantaloupe  Lunch: skips Dinner: fish, chicken, frozen veggies,  Snacks: oyster crackers, pureed nuts Drinks: milk, Diet Coke, unsweetened tea *does not drink water   Patient-reported exercise habits: limited as pt is wheelchair bound; leg cycling in chair and lifts small weights daily DM Prevention:  Statin: Taking; high intensity.?  History of albuminuria? yes Last eye exam:  Lab Results  Component Value Date    HMDIABEYEEXA Retinopathy (A) 04/03/2023   Last foot exam: No foot exam found Tobacco Use:   Tobacco Use: Medium Risk (11/12/2023)   Patient History    Smoking Tobacco Use: Former    Smokeless Tobacco Use: Never    Passive Exposure: Not on file   O:  LibreView Report:   Vitals:  Wt Readings from Last 3 Encounters:  11/12/23 174 lb 2 oz (79 kg)  09/16/23 172 lb 14.4 oz (78.4 kg)  09/09/23 173 lb (78.5 kg)   BP Readings from Last 3 Encounters:  11/12/23 122/63  09/16/23 122/72  09/09/23 121/71   Pulse Readings from Last 3 Encounters:  11/12/23 80  09/16/23 78  09/09/23 79     Labs:?  Lab Results  Component Value Date   HGBA1C 9.1 (H) 09/09/2023   HGBA1C 9.7 (A) 05/22/2023   HGBA1C 10.6 (A) 02/27/2023   GLUCOSE 213 (H) 11/12/2023   MICRALBCREAT 739 (H) 09/09/2023   MICRALBCREAT 5,272 (H) 06/06/2022   CREATININE 1.80 (H) 11/12/2023   CREATININE 1.89 (H) 09/09/2023   CREATININE 1.60 (H) 05/22/2023    Lab Results  Component Value Date   CHOL 169 09/09/2023   LDLCALC 46 09/09/2023   LDLCALC Comment (A) 05/22/2023   LDLCALC Comment (A) 06/06/2022   LDLDIRECT 16.7 12/05/2020   HDL 21 (L) 09/09/2023   TRIG 715 (HH) 09/09/2023   TRIG 972 (HH) 05/22/2023   TRIG 881 (HH) 06/06/2022   ALT 15 05/22/2023   ALT 17 10/07/2021   AST 15 05/22/2023   AST 16 10/07/2021      Chemistry      Component Value Date/Time   NA  136 11/12/2023 1203   NA 137 09/09/2023 1315   K 3.9 11/12/2023 1203   CL 93 (L) 11/12/2023 1203   CO2 26 11/12/2023 1203   BUN 48 (H) 11/12/2023 1203   BUN 50 (H) 09/09/2023 1315   CREATININE 1.80 (H) 11/12/2023 1203      Component Value Date/Time   CALCIUM  9.2 11/12/2023 1203   ALKPHOS 73 05/22/2023 1334   AST 15 05/22/2023 1334   ALT 15 05/22/2023 1334   BILITOT 0.2 05/22/2023 1334       The ASCVD Risk score (Arnett DK, et al., 2019) failed to calculate for the following reasons:   Risk score cannot be calculated because patient has a  medical history suggesting prior/existing ASCVD  Lab Results  Component Value Date   MICRALBCREAT 739 (H) 09/09/2023   MICRALBCREAT 5,272 (H) 06/06/2022    A/P: Diabetes currently uncontrolled with a most recent A1c of 9.1% on 09/09/23, which is down from 9.7% on 05/22/23. Patient is able to verbalize appropriate hypoglycemia management plan. Medication adherence appears appropriate. CGM report shows improvement with a GMI of 8%. There is some confusion as it is now showing patient is insurance via [employer], but he does not recall enrolling in insurance. If this is the case, it will limit the ability to obtain medications via DOH and PAP. Will have to get patient to contact [facility] specialist to determine. Will continue to titrate insulin  at this time given BG readings above goal. -Increased dose of basal insulin  Basaglar (insulin  glargine) 20 units once daily in the morning -Continued GLP-1 Ozempic  (semaglutide ) 1 mg.  -Continued SGLT2-I Jardiance  (empagliflozin ) 10 mg.  -Continued metformin  XR 500 mg daily.  -Continued glipizide  10 mg BID -Patient educated on purpose, proper use, and potential adverse effects of insulin .  -Extensively discussed pathophysiology of diabetes, recommended lifestyle interventions, dietary effects on blood sugar control.  -Counseled on s/sx of and management of hypoglycemia.  -Next A1c anticipated 11/2023.  -Continue monitoring BG via Freestyle Libre 3+ -Contact [facility]  ASCVD risk - secondary prevention in patient with diabetes. Last LDL is 46 mg/dL, at goal of <29 mg/dL. high intensity statin indicated. Triglycerides elevated at 715 mg/dL. Previously attempted to obtain Vascepa, but this was not financially feasible. Will discuss possible initiation at follow up in a few weeks once insurance status is determined. If unable to start Vascepa d/t cost, may consider fenofibrate at that time.  -Continued atorvastatin  80  mg daily.   Patient verbalized understanding of treatment plan. Total time patient counseling 30 minutes.  Follow-up:  Pharmacist on 11/13/23 PCP clinic visit on 01/18/24  [name], PharmD Clinical Pharmacist [facility] [phone]

## 2023-11-23 ENCOUNTER — Other Ambulatory Visit: Payer: Self-pay

## 2023-11-24 ENCOUNTER — Telehealth: Payer: Self-pay

## 2023-11-24 NOTE — Telephone Encounter (Signed)
 Brief Telephone Documentation  Summary of Call: Wife contacted pharmacist to state that patient remains uninsured and insurance pulling through the pharmacy is his worker's compensation. Per [facility], patient should be able to continue to receive his DOH medications since he is uninsured.  Spoke with CPhT, Betina. She recommended patient presenting to pharmacy with documentation that insurance is his worker's comp in order to assist with re-enrollment in both DOH and PAP.  Patient reported some issues with Freestyle Libre 3+ sensor malfunction. Provided contact information for the company in order to receive a replacement sensor.  Follow Up: Patient given direct line for further questions/concerns.  Jekhi Bolin E. Marsh, PharmD Clinical Pharmacist [facility]: [facility] [phone]

## 2023-11-26 ENCOUNTER — Other Ambulatory Visit: Payer: Self-pay

## 2023-11-27 ENCOUNTER — Other Ambulatory Visit: Payer: Self-pay

## 2023-11-30 ENCOUNTER — Other Ambulatory Visit: Payer: Self-pay

## 2023-11-30 ENCOUNTER — Other Ambulatory Visit (HOSPITAL_COMMUNITY): Payer: Self-pay | Admitting: Family Medicine

## 2023-11-30 ENCOUNTER — Other Ambulatory Visit (HOSPITAL_COMMUNITY): Payer: Self-pay | Admitting: Internal Medicine

## 2023-11-30 DIAGNOSIS — I5020 Unspecified systolic (congestive) heart failure: Secondary | ICD-10-CM

## 2023-11-30 MED ORDER — FUROSEMIDE 20 MG PO TABS
40.0000 mg | ORAL_TABLET | Freq: Every day | ORAL | 6 refills | Status: AC
  Filled 2023-11-30: qty 60, 30d supply, fill #0
  Filled 2023-12-29 (×2): qty 60, 30d supply, fill #1
  Filled 2024-01-29: qty 60, 30d supply, fill #2
  Filled 2024-02-24: qty 60, 30d supply, fill #3

## 2023-11-30 MED FILL — Metformin HCl Tab ER 24HR 500 MG: ORAL | 90 days supply | Qty: 90 | Fill #1 | Status: AC

## 2023-12-01 ENCOUNTER — Other Ambulatory Visit: Payer: Self-pay

## 2023-12-02 ENCOUNTER — Other Ambulatory Visit: Payer: Self-pay

## 2023-12-03 ENCOUNTER — Other Ambulatory Visit: Payer: Self-pay

## 2023-12-03 DIAGNOSIS — E1165 Type 2 diabetes mellitus with hyperglycemia: Secondary | ICD-10-CM

## 2023-12-03 MED ORDER — SEMAGLUTIDE (1 MG/DOSE) 4 MG/3ML ~~LOC~~ SOPN
1.0000 mg | PEN_INJECTOR | SUBCUTANEOUS | 3 refills | Status: AC
  Filled 2023-12-03 – 2024-01-29 (×2): qty 3, 28d supply, fill #0
  Filled 2024-02-24: qty 12, 112d supply, fill #1

## 2023-12-08 ENCOUNTER — Other Ambulatory Visit: Payer: Self-pay

## 2023-12-09 ENCOUNTER — Other Ambulatory Visit (HOSPITAL_COMMUNITY): Payer: Self-pay

## 2023-12-09 ENCOUNTER — Other Ambulatory Visit: Payer: Self-pay

## 2023-12-09 ENCOUNTER — Ambulatory Visit: Payer: Self-pay

## 2023-12-09 DIAGNOSIS — Z794 Long term (current) use of insulin: Secondary | ICD-10-CM

## 2023-12-09 DIAGNOSIS — E782 Mixed hyperlipidemia: Secondary | ICD-10-CM

## 2023-12-09 DIAGNOSIS — E1165 Type 2 diabetes mellitus with hyperglycemia: Secondary | ICD-10-CM

## 2023-12-09 MED ORDER — GLIPIZIDE 5 MG PO TABS
5.0000 mg | ORAL_TABLET | Freq: Two times a day (BID) | ORAL | 1 refills | Status: DC
  Filled 2023-12-09: qty 30, 15d supply, fill #0
  Filled 2023-12-29 (×2): qty 30, 15d supply, fill #1

## 2023-12-09 MED ORDER — BASAGLAR KWIKPEN 100 UNIT/ML ~~LOC~~ SOPN
24.0000 [IU] | PEN_INJECTOR | Freq: Every day | SUBCUTANEOUS | 1 refills | Status: DC
  Filled 2023-12-09 – 2023-12-12 (×2): qty 15, 62d supply, fill #0

## 2023-12-09 NOTE — Progress Notes (Signed)
 S:     Reason for visit: ?  Curtis [name] is a 68 y.o. male with a history of diabetes (type 2), who presents today for a follow up diabetes Face to Face pharmacotherapy visit.? Pertinent PMH also includes HFrEF, ED, HLD, s/p CABG x 5, and hx of kidney stones.   Care Team: Primary Care Provider: [name], MD  At last visit with clinical pharmacist on 11/18/23,Basaglar  was increased from 16 to 20 units daily.  Current diabetes medications include: glipizide  10 mg BID, Jardiance  10 mg daily, metformin  XR 500 mg daily, Ozempic  1 mg weekly, Basaglar  20 units daily Previous diabetes medications include: Ozempic  2 mg (GI upset), Rybelsus   Current hypertension medications include: Toprol  XL 50 mg daily, metolazone  2.5 mg Mon/Wed/Fri, furosemide  20 mg 2 tablets daily, Entresto  97/103 mg BID Current hyperlipidemia medications include: atorvastatin  80 mg daily  Patient reports adherence to taking all medications as prescribed.    Have you been experiencing any side effects to the medications prescribed? Yes - diarrhea with Ozempic  (keeps Imodium on hand) Do you have any problems obtaining medications due to transportation or finances? Yes - uses DOH and PAP for medications Insurance coverage: self-pay  Current medication access support:  Ozempic  via Nov[employer]isk Jardiance  via [employer] Entresto  via [employer]  Patient denies hypoglycemic events.  Patient reported dietary habits: Eats 2 meals/day Breakfast: eggs, toast, cantaloupe  Lunch: skips Dinner: fish, chicken, frozen veggies,  Snacks: oyster crackers, pureed nuts Drinks: milk, Diet Coke, unsweetened tea *does not drink water    Patient-reported exercise habits: limited as pt is wheelchair bound; leg cycling in chair and lifts small weights daily DM Prevention:  Statin: Taking; high intensity.?  ACE/ARB: yes; Entresto  Last urinary albumin /creatinine ratio:  Lab Results  Component Value Date   MICRALBCREAT 739 (H)  09/09/2023   MICRALBCREAT 5,272 (H) 06/06/2022   Last eye exam:  Lab Results  Component Value Date   HMDIABEYEEXA Retinopathy (A) 04/03/2023   Lab Results  Component Value Date   HMDIABEYEEXA Retinopathy (A) 04/03/2023   Last foot exam: No foot exam found Tobacco Use:  Tobacco Use: Medium Risk (11/12/2023)   Patient History    Smoking Tobacco Use: Former    Smokeless Tobacco Use: Never    Passive Exposure: Not on file   O:  LibreView Report     Vitals:  Wt Readings from Last 3 Encounters:  11/12/23 174 lb 2 oz (79 kg)  09/16/23 172 lb 14.4 oz (78.4 kg)  09/09/23 173 lb (78.5 kg)   BP Readings from Last 3 Encounters:  11/12/23 122/63  09/16/23 122/72  09/09/23 121/71   Pulse Readings from Last 3 Encounters:  11/12/23 80  09/16/23 78  09/09/23 79     Labs:?  Lab Results  Component Value Date   HGBA1C 9.1 (H) 09/09/2023   HGBA1C 9.7 (A) 05/22/2023   HGBA1C 10.6 (A) 02/27/2023   GLUCOSE 213 (H) 11/12/2023   MICRALBCREAT 739 (H) 09/09/2023   MICRALBCREAT 5,272 (H) 06/06/2022   CREATININE 1.80 (H) 11/12/2023   CREATININE 1.89 (H) 09/09/2023   CREATININE 1.60 (H) 05/22/2023    Lab Results  Component Value Date   CHOL 169 09/09/2023   LDLCALC 46 09/09/2023   LDLCALC Comment (A) 05/22/2023   LDLCALC Comment (A) 06/06/2022   LDLDIRECT 16.7 12/05/2020   HDL 21 (L) 09/09/2023   TRIG 715 (HH) 09/09/2023   TRIG 972 (HH) 05/22/2023   TRIG 881 (HH) 06/06/2022   ALT 15 05/22/2023  ALT 17 10/07/2021   AST 15 05/22/2023   AST 16 10/07/2021      Chemistry      Component Value Date/Time   NA 136 11/12/2023 1203   NA 137 09/09/2023 1315   K 3.9 11/12/2023 1203   CL 93 (L) 11/12/2023 1203   CO2 26 11/12/2023 1203   BUN 48 (H) 11/12/2023 1203   BUN 50 (H) 09/09/2023 1315   CREATININE 1.80 (H) 11/12/2023 1203      Component Value Date/Time   CALCIUM  9.2 11/12/2023 1203   ALKPHOS 73 05/22/2023 1334   AST 15 05/22/2023 1334   ALT 15 05/22/2023 1334    BILITOT 0.2 05/22/2023 1334       The ASCVD Risk score (Arnett DK, et al., 2019) failed to calculate for the following reasons:   Risk score cannot be calculated because patient has a medical history suggesting prior/existing ASCVD  Lab Results  Component Value Date   MICRALBCREAT 739 (H) 09/09/2023   MICRALBCREAT 5,272 (H) 06/06/2022    A/P: Diabetes currently uncontrolled with a most recent A1c of 9.1% on 09/09/23, however, GMI on CGM report is 6.8%. Patient is able to verbalize appropriate hypoglycemia management plan. Medication adherence appears appropriate. Given age and duration of therapy, will attempt to slowly discontinue sulfonylurea therapy. Will increase insulin  at this time.  -Increased dose of basal insulin  Basaglar  (insulin  glargine)  24 units daily.  -Decreased dose of glipizide  from 10 to 5 mg BID -Continued GLP-1 Ozempic  (semaglutide ) 1 mg weekly.  -Continued SGLT2-I Jardiance  (empagliflozin ) 25 mg daily.  -Continued metformin  XR 500 mg daily.  -Extensively discussed pathophysiology of diabetes, recommended lifestyle interventions, dietary effects on blood sugar control.  -Counseled on s/sx of and management of hypoglycemia.  -Next A1c anticipated next visit.   ASCVD risk - secondary prevention in patient with diabetes. Last LDL is 46 mg/dL, at goal of <29 mg/dL. high intensity statin indicated. Triglycerides elevated at 715 mg/dL. Vascepa not financially feasible d/t lack of insurance. Pending next lipid panel, may discuss fenofibrate initiation.  -Continued atorvastatin  80 mg daily.   Patient verbalized understanding of treatment plan. Total time patient counseling 30 minutes.  Follow-up:  Pharmacist on 01/06/24 PCP clinic visit on 01/18/24  [name], PharmD, CPP Clinical Pharmacist [facility] [phone]

## 2023-12-12 ENCOUNTER — Other Ambulatory Visit: Payer: Self-pay

## 2023-12-22 ENCOUNTER — Telehealth: Payer: Self-pay

## 2023-12-22 NOTE — Telephone Encounter (Signed)
 Received refill re-order form Novo Nordisk (Ozempic ) filled and faxed to provider office to sign and date.

## 2023-12-28 NOTE — Telephone Encounter (Signed)
 Received provider portion back faxed to Novo Nordisk today.

## 2023-12-29 ENCOUNTER — Other Ambulatory Visit: Payer: Self-pay

## 2024-01-06 ENCOUNTER — Other Ambulatory Visit: Payer: Self-pay

## 2024-01-06 ENCOUNTER — Ambulatory Visit: Payer: Self-pay

## 2024-01-06 DIAGNOSIS — Z794 Long term (current) use of insulin: Secondary | ICD-10-CM

## 2024-01-06 DIAGNOSIS — E1165 Type 2 diabetes mellitus with hyperglycemia: Secondary | ICD-10-CM

## 2024-01-06 MED ORDER — BASAGLAR KWIKPEN 100 UNIT/ML ~~LOC~~ SOPN
28.0000 [IU] | PEN_INJECTOR | Freq: Every day | SUBCUTANEOUS | 1 refills | Status: AC
  Filled 2024-01-06: qty 15, 53d supply, fill #0

## 2024-01-06 NOTE — Progress Notes (Signed)
 S:     Reason for visit: ?  Curtis [name] is a 68 y.o. male with a history of diabetes (type 2), who presents today for a follow up diabetes Face to Face pharmacotherapy visit.? Pertinent PMH also includes HFrEF, ED, HLD, s/p CABG x 5, and hx of kidney stones.   Care Team: Primary Care Provider: [name], MD  At last visit with clinical pharmacist on 12/09/23, CGM report showed a GMI of 6.8%. Patient was instructed to increase Basaglar  to 24 units and decrease glipizide  to 5 mg BID.   Current diabetes medications include: glipizide  5 mg BID, Jardiance  10 mg daily, metformin  XR 500 mg daily, Ozempic  1 mg weekly, Basaglar  24 units daily Previous diabetes medications include: Ozempic  2 mg (GI upset), Rybelsus   Current hypertension medications include: Toprol  XL 50 mg daily, metolazone  2.5 mg Mon/Wed/Fri, furosemide  20 mg 2 tablets daily, Entresto  97/103 mg BID Current hyperlipidemia medications include: atorvastatin  80 mg daily  Patient reports adherence to taking all medications as prescribed.    Have you been experiencing any side effects to the medications prescribed? Yes - diarrhea with Ozempic  (keeps Imodium on hand) Do you have any problems obtaining medications due to transportation or finances? Yes - uses DOH and PAP for medications Insurance coverage: self-pay  Current medication access support:  Ozempic  via Novo Nordisk Jardiance  via [employer] Entresto  via [employer]  Patient denies hypoglycemic events.  Patient reported dietary habits: Eats 2 meals/day Breakfast: eggs, toast, cantaloupe  Lunch: skips Dinner: fish, chicken, frozen veggies,  Snacks: oyster crackers, pureed nuts Drinks: milk, Diet Coke, unsweetened tea *does not drink water    Patient-reported exercise habits: limited as pt is wheelchair bound; leg cycling in chair and lifts small weights daily DM Prevention:  Statin: Taking; high intensity.?  ACE/ARB: yes; Entresto  Last urinary  albumin /creatinine ratio:  Lab Results  Component Value Date   MICRALBCREAT 739 (H) 09/09/2023   MICRALBCREAT 5,272 (H) 06/06/2022   Last eye exam:  Lab Results  Component Value Date   HMDIABEYEEXA Retinopathy (A) 04/03/2023   Lab Results  Component Value Date   HMDIABEYEEXA Retinopathy (A) 04/03/2023   Last foot exam: No foot exam found Tobacco Use:  Tobacco Use: Medium Risk (11/12/2023)   Patient History    Smoking Tobacco Use: Former    Smokeless Tobacco Use: Never    Passive Exposure: Not on file   O:  LibreView Report      Vitals:  Wt Readings from Last 3 Encounters:  11/12/23 174 lb 2 oz (79 kg)  09/16/23 172 lb 14.4 oz (78.4 kg)  09/09/23 173 lb (78.5 kg)   BP Readings from Last 3 Encounters:  11/12/23 122/63  09/16/23 122/72  09/09/23 121/71   Pulse Readings from Last 3 Encounters:  11/12/23 80  09/16/23 78  09/09/23 79     Labs:?  Lab Results  Component Value Date   HGBA1C 9.1 (H) 09/09/2023   HGBA1C 9.7 (A) 05/22/2023   HGBA1C 10.6 (A) 02/27/2023   GLUCOSE 213 (H) 11/12/2023   MICRALBCREAT 739 (H) 09/09/2023   MICRALBCREAT 5,272 (H) 06/06/2022   CREATININE 1.80 (H) 11/12/2023   CREATININE 1.89 (H) 09/09/2023   CREATININE 1.60 (H) 05/22/2023    Lab Results  Component Value Date   CHOL 169 09/09/2023   LDLCALC 46 09/09/2023   LDLCALC Comment (A) 05/22/2023   LDLCALC Comment (A) 06/06/2022   LDLDIRECT 16.7 12/05/2020   HDL 21 (L) 09/09/2023   TRIG 715 (HH) 09/09/2023  TRIG 972 (HH) 05/22/2023   TRIG 881 (HH) 06/06/2022   ALT 15 05/22/2023   ALT 17 10/07/2021   AST 15 05/22/2023   AST 16 10/07/2021      Chemistry      Component Value Date/Time   NA 136 11/12/2023 1203   NA 137 09/09/2023 1315   K 3.9 11/12/2023 1203   CL 93 (L) 11/12/2023 1203   CO2 26 11/12/2023 1203   BUN 48 (H) 11/12/2023 1203   BUN 50 (H) 09/09/2023 1315   CREATININE 1.80 (H) 11/12/2023 1203      Component Value Date/Time   CALCIUM  9.2 11/12/2023  1203   ALKPHOS 73 05/22/2023 1334   AST 15 05/22/2023 1334   ALT 15 05/22/2023 1334   BILITOT 0.2 05/22/2023 1334       The ASCVD Risk score (Arnett DK, et al., 2019) failed to calculate for the following reasons:   Risk score cannot be calculated because patient has a medical history suggesting prior/existing ASCVD   * - Cholesterol units were assumed  Lab Results  Component Value Date   MICRALBCREAT 739 (H) 09/09/2023   MICRALBCREAT 5,272 (H) 06/06/2022    A/P: Diabetes currently uncontrolled with a most recent A1c of 9.1% on 09/09/23, however, GMI on CGM report is 7.3%. Wife attributes elevation in GMI from last visit d/t Thanksgiving leftovers. Daily CGM graphs are improved these past 7 days. Patient is able to verbalize appropriate hypoglycemia management plan. Medication adherence appears appropriate. Given age and duration of therapy, will discontinue sulfonylurea therapy. Will increase insulin  at this time.  -Increased dose of basal insulin  Basaglar  (insulin  glargine)  28 units daily.  -Discontinued glipizide  -Continued GLP-1 Ozempic  (semaglutide ) 1 mg weekly.  -Continued SGLT2-I Jardiance  (empagliflozin ) 25 mg daily.  -Continued metformin  XR 500 mg daily.  -Extensively discussed pathophysiology of diabetes, recommended lifestyle interventions, dietary effects on blood sugar control.  -Counseled on s/sx of and management of hypoglycemia.  -Next A1c anticipated next visit.   ASCVD risk - secondary prevention in patient with diabetes. Last LDL is 46 mg/dL, at goal of <29 mg/dL. high intensity statin indicated. Triglycerides elevated at 715 mg/dL. Vascepa not financially feasible d/t lack of insurance. Discussed with PCP who would like to wait for next lipid panel before possible fenofibrate initiation.  -Continued atorvastatin  80 mg daily.   Patient verbalized understanding of treatment plan. Total time patient counseling 30 minutes.  Follow-up:  Pharmacist on 02/24/24 PCP  clinic visit on 01/18/24  [name], PharmD, CPP Clinical Pharmacist [facility] [phone]

## 2024-01-11 ENCOUNTER — Other Ambulatory Visit: Payer: Self-pay

## 2024-01-18 ENCOUNTER — Encounter: Payer: Self-pay | Admitting: Family Medicine

## 2024-01-18 ENCOUNTER — Ambulatory Visit (INDEPENDENT_AMBULATORY_CARE_PROVIDER_SITE_OTHER): Payer: Self-pay | Admitting: Family Medicine

## 2024-01-18 VITALS — BP 97/54 | HR 81 | Wt 170.0 lb

## 2024-01-18 DIAGNOSIS — E11319 Type 2 diabetes mellitus with unspecified diabetic retinopathy without macular edema: Secondary | ICD-10-CM

## 2024-01-18 DIAGNOSIS — G8929 Other chronic pain: Secondary | ICD-10-CM

## 2024-01-18 DIAGNOSIS — M79604 Pain in right leg: Secondary | ICD-10-CM

## 2024-01-18 DIAGNOSIS — Z23 Encounter for immunization: Secondary | ICD-10-CM

## 2024-01-18 DIAGNOSIS — Z7984 Long term (current) use of oral hypoglycemic drugs: Secondary | ICD-10-CM

## 2024-01-18 LAB — POCT GLYCOSYLATED HEMOGLOBIN (HGB A1C): Hemoglobin A1C: 7.7 % — AB (ref 4.0–5.6)

## 2024-01-18 MED ORDER — HYDROCODONE-ACETAMINOPHEN 5-325 MG PO TABS: 1.0000 | ORAL_TABLET | Freq: Four times a day (QID) | ORAL | 0 refills | Status: AC | PRN

## 2024-01-18 NOTE — Progress Notes (Signed)
" °  ° ° °  Established patient visit   Patient: Curtis [name]   DOB: [birth year]   68 y.o. Male  MRN: [id number] Visit Date: 01/18/2024  Today's healthcare provider: Nancyann Perry, MD   Chief Complaint  Patient presents with   Medical Management of Chronic Issues    T2DM follow-up   Subjective    HPI Presents today for flu shot. Sees [name] Marsh for insulin  management and is due for A1c. Also due for hydrocodone /apap refill.  Medications: Show/hide medication list[1]       Objective    BP (!) 97/54 (BP Location: Left Arm, Patient Position: Sitting, Cuff Size: Normal)   Pulse 81   Wt 170 lb (77.1 kg)   SpO2 97%   BMI 25.85 kg/m      Results for orders placed or performed in visit on 01/18/24  POCT glycosylated hemoglobin (Hb A1C)  Result Value Ref Range   Hemoglobin A1C 7.7 (A) 4.0 - 5.6 %    Assessment & Plan     1. Influenza vaccine needed (Primary)  - Flu vaccine HIGH DOSE PF(Fluzone Trivalent)    Return in about 6 months (around 07/18/2024) for Diabetes.         Nancyann Perry, MD  [facility] [phone] (phone) [phone] (fax)  [facility]    [1]  Outpatient Medications Prior to Visit  Medication Sig   acetaminophen  (TYLENOL ) 500 MG tablet Take 2 tablets (1,000 mg total) by mouth every 6 (six) hours.   aspirin  81 MG chewable tablet Chew 1 tablet (81 mg total) by mouth daily.   atorvastatin  (LIPITOR) 80 MG tablet Take 1 tablet (80 mg total) by mouth daily.   Continuous Glucose Sensor (FREESTYLE LIBRE 3 PLUS SENSOR) MISC Change sensor every 15 days.   famotidine  (PEPCID ) 40 MG tablet Take 1 tablet (40 mg total) by mouth daily.   furosemide  (LASIX ) 20 MG tablet Take 2 tablets (40 mg total) by mouth daily.   Insulin  Glargine (BASAGLAR  KWIKPEN) 100 UNIT/ML Inject 28 Units into the skin daily.   Insulin  Pen Needle (PEN NEEDLES) 31G X 5 MM MISC Use to inject insulin  once daily   JARDIANCE  10 MG TABS  tablet TAKE ONE TABLET BY MOUTH EVERY MORNING BEFORE BREAKFAST   Lancet Devices MISC Use to check blood sugar twice a day   metFORMIN  (GLUCOPHAGE -XR) 500 MG 24 hr tablet Take 1 tablet (500 mg total) by mouth daily.   metolazone  (ZAROXOLYN ) 2.5 MG tablet Take 1 tablet (2.5 mg total) by mouth every Monday, Wednesday, and Friday.   metoprolol  succinate (TOPROL -XL) 50 MG 24 hr tablet Take 1 tablet (50 mg total) by mouth daily.   sacubitril -valsartan  (ENTRESTO ) 97-103 MG TAKE 1 TABLET BY MOUTH 2 (TWO) TIMES DAILY.   Semaglutide , 1 MG/DOSE, 4 MG/3ML SOPN Inject 1 mg into the skin once a week.   silver  sulfADIAZINE  (SILVADENE ) 1 % cream Apply 1 Application topically daily.   [DISCONTINUED] HYDROcodone -acetaminophen  (NORCO/VICODIN) 5-325 MG tablet Take 1 tablet by mouth every 6 (six) hours as needed for moderate pain (pain score 4-6).   No facility-administered medications prior to visit.   "

## 2024-01-18 NOTE — Patient Instructions (Signed)
 Curtis [name]  Please review the attached list of medications and notify my office if there are any errors.   . Please bring all of your medications to every appointment so we can make sure that our medication list is the same as yours.

## 2024-01-22 ENCOUNTER — Other Ambulatory Visit: Payer: Self-pay

## 2024-01-25 ENCOUNTER — Other Ambulatory Visit (HOSPITAL_COMMUNITY): Payer: Self-pay

## 2024-01-25 ENCOUNTER — Other Ambulatory Visit: Payer: Self-pay

## 2024-01-29 ENCOUNTER — Telehealth (HOSPITAL_COMMUNITY): Payer: Self-pay

## 2024-01-29 ENCOUNTER — Other Ambulatory Visit (HOSPITAL_COMMUNITY): Payer: Self-pay

## 2024-01-29 ENCOUNTER — Other Ambulatory Visit: Payer: Self-pay

## 2024-01-29 ENCOUNTER — Encounter (HOSPITAL_COMMUNITY): Payer: Self-pay

## 2024-01-29 MED ORDER — SACUBITRIL-VALSARTAN 97-103 MG PO TABS: 1.0000 | ORAL_TABLET | Freq: Two times a day (BID) | ORAL | 3 refills | Status: AC

## 2024-01-29 NOTE — Telephone Encounter (Signed)
 Advanced Heart Failure Patient Advocate Encounter  Patient was electronically approved to receive Jardiance  from [employer] Effective 01/29/2024 to 01/27/2025  Rachel DEL, CPhT Rx Patient Advocate Phone: [phone]

## 2024-01-29 NOTE — Addendum Note (Signed)
 Addended by: Cynthya Yam, DALTON HERO on: 01/29/2024 03:53 PM   Modules accepted: Orders

## 2024-02-01 ENCOUNTER — Other Ambulatory Visit: Payer: Self-pay

## 2024-02-24 ENCOUNTER — Ambulatory Visit: Payer: Self-pay

## 2024-02-24 ENCOUNTER — Other Ambulatory Visit: Payer: Self-pay

## 2024-02-25 ENCOUNTER — Other Ambulatory Visit: Payer: Self-pay

## 2024-02-25 ENCOUNTER — Ambulatory Visit: Payer: Self-pay

## 2024-02-25 NOTE — Progress Notes (Unsigned)
 "  S:     Reason for visit: ?  Curtis [name] is a 69 y.o. male with a history of diabetes (type 2), who presents today for a follow up diabetes Face to Face pharmacotherapy visit.? Pertinent PMH also includes HFrEF, ED, HLD, s/p CABG x 5, and hx of kidney stones.   Care Team: Primary Care Provider: [name], MD  At last visit with clinical pharmacist on 12/09/23, CGM report showed a GMI of 6.8%. Patient was instructed to increase Basaglar  to 24 units and decrease glipizide  to 5 mg BID.   Current diabetes medications include: glipizide  5 mg BID, Jardiance  10 mg daily, metformin  XR 500 mg daily, Ozempic  1 mg weekly, Basaglar  24 units daily Previous diabetes medications include: Ozempic  2 mg (GI upset), Rybelsus   Current hypertension medications include: Toprol  XL 50 mg daily, metolazone  2.5 mg Mon/Wed/Fri, furosemide  20 mg 2 tablets daily, Entresto  97/103 mg BID Current hyperlipidemia medications include: atorvastatin  80 mg daily  Patient reports adherence to taking all medications as prescribed.    Have you been experiencing any side effects to the medications prescribed? Yes - diarrhea with Ozempic  (keeps Imodium on hand) Do you have any problems obtaining medications due to transportation or finances? Yes - uses [facility] and [facility] for medications Insurance coverage: self-pay  Current medication access support:  Ozempic  via [employer] Jardiance  via [employer] Entresto  via [employer]  Patient denies hypoglycemic events.  Patient reported dietary habits: Eats 2 meals/day Breakfast: eggs, toast, cantaloupe  Lunch: skips Dinner: fish, chicken, frozen veggies,  Snacks: oyster crackers, pureed nuts Drinks: milk, Diet Coke, unsweetened tea *does not drink water    Patient-reported exercise habits: limited as pt is wheelchair bound; leg cycling in chair and lifts small weights daily DM Prevention:  Statin: Taking; high intensity.?  ACE/ARB: yes; Entresto  Last urinary  albumin /creatinine ratio:  Lab Results  Component Value Date   MICRALBCREAT 739 (H) 09/09/2023   MICRALBCREAT 5,272 (H) 06/06/2022   Last eye exam:  Lab Results  Component Value Date   HMDIABEYEEXA Retinopathy (A) 04/03/2023   Lab Results  Component Value Date   HMDIABEYEEXA Retinopathy (A) 04/03/2023   Last foot exam: No foot exam found Tobacco Use:  Tobacco Use: Medium Risk (01/18/2024)   Patient History    Smoking Tobacco Use: Former    Smokeless Tobacco Use: Never    Passive Exposure: Not on file   O:  Libre[employer] Report      Vitals:  Wt Readings from Last 3 Encounters:  01/18/24 170 lb (77.1 kg)  11/12/23 174 lb 2 oz (79 kg)  09/16/23 172 lb 14.4 oz (78.4 kg)   BP Readings from Last 3 Encounters:  01/18/24 (!) 97/54  11/12/23 122/63  09/16/23 122/72   Pulse Readings from Last 3 Encounters:  01/18/24 81  11/12/23 80  09/16/23 78     Labs:?  Lab Results  Component Value Date   HGBA1C 7.7 (A) 01/18/2024   HGBA1C 9.1 (H) 09/09/2023   HGBA1C 9.7 (A) 05/22/2023   GLUCOSE 213 (H) 11/12/2023   MICRALBCREAT 739 (H) 09/09/2023   MICRALBCREAT 5,272 (H) 06/06/2022   CREATININE 1.80 (H) 11/12/2023   CREATININE 1.89 (H) 09/09/2023   CREATININE 1.60 (H) 05/22/2023    Lab Results  Component Value Date   CHOL 169 09/09/2023   LDLCALC 46 09/09/2023   LDLCALC Comment (A) 05/22/2023   LDLCALC Comment (A) 06/06/2022   LDLDIRECT 16.7 12/05/2020   HDL 21 (L) 09/09/2023   TRIG 715 (HH)  09/09/2023   TRIG 972 (HH) 05/22/2023   TRIG 881 (HH) 06/06/2022   ALT 15 05/22/2023   ALT 17 10/07/2021   AST 15 05/22/2023   AST 16 10/07/2021      Chemistry      Component Value Date/Time   NA 136 11/12/2023 1203   NA 137 09/09/2023 1315   K 3.9 11/12/2023 1203   CL 93 (L) 11/12/2023 1203   CO2 26 11/12/2023 1203   BUN 48 (H) 11/12/2023 1203   BUN 50 (H) 09/09/2023 1315   CREATININE 1.80 (H) 11/12/2023 1203      Component Value Date/Time   CALCIUM  9.2 11/12/2023  1203   ALKPHOS 73 05/22/2023 1334   AST 15 05/22/2023 1334   ALT 15 05/22/2023 1334   BILITOT 0.2 05/22/2023 1334       The ASCVD Risk score (Arnett DK, et al., 2019) failed to calculate for the following reasons:   Risk score cannot be calculated because patient has a medical history suggesting prior/existing ASCVD   * - Cholesterol units were assumed  Lab Results  Component Value Date   MICRALBCREAT 739 (H) 09/09/2023   MICRALBCREAT 5,272 (H) 06/06/2022    A/P: Diabetes currently uncontrolled with a most recent A1c of 9.1% on 09/09/23, however, GMI on CGM report is 7.3%. Wife attributes elevation in GMI from last visit d/t Thanksgiving leftovers. Daily CGM graphs are improved these past 7 days. Patient is able to verbalize appropriate hypoglycemia management plan. Medication adherence appears appropriate. Given age and duration of therapy, will discontinue sulfonylurea therapy. Will increase insulin  at this time.  -Increased dose of basal insulin  Basaglar  (insulin  glargine)  28 units daily.  -Discontinued glipizide  -Continued GLP-1 Ozempic  (semaglutide ) 1 mg weekly.  -Continued SGLT2-I Jardiance  (empagliflozin ) 25 mg daily.  -Continued metformin  XR 500 mg daily.  -Extensively discussed pathophysiology of diabetes, recommended lifestyle interventions, dietary effects on blood sugar control.  -Counseled on s/sx of and management of hypoglycemia.  -Next A1c anticipated next visit.   ASCVD risk - secondary prevention in patient with diabetes. Last LDL is 46 mg/dL, at goal of <29 mg/dL. high intensity statin indicated. Triglycerides elevated at 715 mg/dL. Vascepa not financially feasible d/t lack of insurance. Discussed with PCP who would like to wait for next lipid panel before possible fenofibrate initiation.  -Continued atorvastatin  80 mg daily.   Patient verbalized understanding of treatment plan. Total time patient counseling 30 minutes.  Follow-up:  Pharmacist on 02/24/24 PCP  clinic visit on 01/18/24  [name], PharmD, CPP Clinical Pharmacist [facility] [phone]   "

## 2024-02-25 NOTE — Patient Instructions (Signed)
 [facility]: [phone]

## 2024-03-23 ENCOUNTER — Ambulatory Visit: Payer: Self-pay

## 2024-07-18 ENCOUNTER — Ambulatory Visit: Payer: Self-pay | Admitting: Family Medicine
